# Patient Record
Sex: Female | Born: 1955 | Race: Black or African American | Hispanic: No | Marital: Single | Smoking: Former smoker
Health system: Southern US, Community
[De-identification: ages and names within clinical notes are randomized; demographics above are authoritative.]

## PROBLEM LIST (undated history)

## (undated) DIAGNOSIS — I10 Essential (primary) hypertension: Secondary | ICD-10-CM

## (undated) HISTORY — PX: ABDOMINAL HYSTERECTOMY: SHX81

---

## 2000-12-31 ENCOUNTER — Emergency Department (HOSPITAL_COMMUNITY): Admission: EM | Admit: 2000-12-31 | Discharge: 2000-12-31 | Payer: Self-pay | Admitting: *Deleted

## 2000-12-31 ENCOUNTER — Encounter: Payer: Self-pay | Admitting: *Deleted

## 2001-08-29 ENCOUNTER — Encounter: Payer: Self-pay | Admitting: Emergency Medicine

## 2001-08-29 ENCOUNTER — Observation Stay (HOSPITAL_COMMUNITY): Admission: EM | Admit: 2001-08-29 | Discharge: 2001-08-30 | Payer: Self-pay | Admitting: Emergency Medicine

## 2002-05-15 ENCOUNTER — Encounter: Payer: Self-pay | Admitting: *Deleted

## 2002-05-15 ENCOUNTER — Emergency Department (HOSPITAL_COMMUNITY): Admission: EM | Admit: 2002-05-15 | Discharge: 2002-05-15 | Payer: Self-pay | Admitting: Emergency Medicine

## 2003-04-08 ENCOUNTER — Emergency Department (HOSPITAL_COMMUNITY): Admission: EM | Admit: 2003-04-08 | Discharge: 2003-04-08 | Payer: Self-pay | Admitting: Emergency Medicine

## 2003-04-10 ENCOUNTER — Emergency Department (HOSPITAL_COMMUNITY): Admission: EM | Admit: 2003-04-10 | Discharge: 2003-04-10 | Payer: Self-pay | Admitting: *Deleted

## 2003-04-13 ENCOUNTER — Ambulatory Visit (HOSPITAL_COMMUNITY): Admission: RE | Admit: 2003-04-13 | Discharge: 2003-04-13 | Payer: Self-pay | Admitting: Family Medicine

## 2003-05-08 ENCOUNTER — Inpatient Hospital Stay (HOSPITAL_COMMUNITY): Admission: RE | Admit: 2003-05-08 | Discharge: 2003-05-10 | Payer: Self-pay | Admitting: Obstetrics & Gynecology

## 2004-05-19 ENCOUNTER — Ambulatory Visit (HOSPITAL_COMMUNITY): Admission: RE | Admit: 2004-05-19 | Discharge: 2004-05-19 | Payer: Self-pay | Admitting: Obstetrics & Gynecology

## 2004-10-25 ENCOUNTER — Inpatient Hospital Stay (HOSPITAL_COMMUNITY): Admission: AD | Admit: 2004-10-25 | Discharge: 2004-10-28 | Payer: Self-pay | Admitting: Cardiology

## 2004-10-25 ENCOUNTER — Encounter: Payer: Self-pay | Admitting: Emergency Medicine

## 2004-10-27 ENCOUNTER — Ambulatory Visit: Payer: Self-pay | Admitting: Cardiology

## 2004-11-05 ENCOUNTER — Ambulatory Visit: Payer: Self-pay | Admitting: Physician Assistant

## 2008-10-19 ENCOUNTER — Ambulatory Visit (HOSPITAL_COMMUNITY): Admission: RE | Admit: 2008-10-19 | Discharge: 2008-10-19 | Payer: Self-pay | Admitting: Family Medicine

## 2008-12-11 ENCOUNTER — Ambulatory Visit (HOSPITAL_COMMUNITY): Admission: RE | Admit: 2008-12-11 | Discharge: 2008-12-11 | Payer: Self-pay | Admitting: General Surgery

## 2008-12-11 ENCOUNTER — Encounter (INDEPENDENT_AMBULATORY_CARE_PROVIDER_SITE_OTHER): Payer: Self-pay | Admitting: General Surgery

## 2009-08-14 ENCOUNTER — Emergency Department (HOSPITAL_COMMUNITY): Admission: EM | Admit: 2009-08-14 | Discharge: 2009-08-14 | Payer: Self-pay | Admitting: Emergency Medicine

## 2010-01-09 ENCOUNTER — Ambulatory Visit (HOSPITAL_COMMUNITY): Admission: RE | Admit: 2010-01-09 | Discharge: 2010-01-09 | Payer: Self-pay | Admitting: Family Medicine

## 2010-09-16 NOTE — H&P (Signed)
NAME:  Betty Zamora, Betty Zamora           ACCOUNT NO.:  [id number]   MEDICAL RECORD NO.:  [id number]          PATIENT TYPE:  AMB   LOCATION:  DAY                           FACILITY:  [facility]   PHYSICIAN:  Dalia Heading, M.D.  DATE OF BIRTH:  1955-10-24   DATE OF ADMISSION:  DATE OF DISCHARGE:  LH                              HISTORY & PHYSICAL   CHIEF COMPLAINT:  Hematochezia.   HISTORY OF PRESENT ILLNESS:  The patient is a 55 year old black female  who is referred for endoscopic evaluation.  She needs colonoscopy due to  hematochezia.  No abdominal pain, weight loss, nausea, vomiting,  diarrhea, constipation, or melena have been noted.  She has never had a  colonoscopy.  There is no family history of colon carcinoma.   PAST MEDICAL HISTORY:  Includes hypertension.   PAST SURGICAL HISTORY:  Hysterectomy.   CURRENT MEDICATIONS:  Nabumetone and amlodipine.   ALLERGIES:  No known drug allergies.   REVIEW OF SYSTEMS:  Noncontributory.   PHYSICAL EXAMINATION:  GENERAL:  The patient is a well-developed, well-  nourished black female in no acute distress.  LUNGS:  Clear to auscultation with equal breath sounds bilaterally.  HEART:  Examination reveals regular rate and rhythm without S3, S4, or  murmurs.  ABDOMEN:  Soft, nontender, and nondistended.  No hepatosplenomegaly or  masses are noted.  RECTAL:  Deferred to the procedure.   IMPRESSION:  Hematochezia.   PLAN:  The patient is scheduled for a colonoscopy on December 11, 2008.  Risks and benefits of the procedure including bleeding and perforation  were fully explained to the patient, gave informed consent.      Dalia Heading, M.D.  Electronically Signed    MAJ/MEDQ  D:  12/04/2008  T:  12/05/2008  Job:  [id number]

## 2010-09-19 NOTE — Discharge Summary (Signed)
NAME:  Betty Zamora, Betty Zamora                     ACCOUNT NO.:  [id number]   MEDICAL RECORD NO.:  [id number]                   PATIENT TYPE:  INP   LOCATION:  A417                                 FACILITY:  [facility]   PHYSICIAN:  Lazaro Arms, M.D.                DATE OF BIRTH:  Feb 01, 1956   DATE OF ADMISSION:  05/08/2003  DATE OF DISCHARGE:  05/10/2003                                 DISCHARGE SUMMARY   DISCHARGE DIAGNOSES:  1. Status post total abdominal hysterectomy with bilateral salpingo-     oophorectomy.  2. Postoperative incisional hematoma.  3. Otherwise unremarkable postoperative course.   PROCEDURE:  Total abdominal hysterectomy, bilateral salpingo-oophorectomy.   Please refer to the transcribed history and physical and the operative note  for details of admission to the hospital.   HOSPITAL COURSE:  The patient was admitted postoperatively.  She had an  unremarkable intraoperative course.  She had an unremarkable postoperative  course.  She tolerated clear liquids and a regular diet.  Had flatus and  return of normal bowel function.  Her abdominal exam was benign.  Her  postoperative day #1 hemoglobin and hematocrit were 12.4 and 35.5 with a  white count of 8000.  She had a hematoma on the right side of her incision  that was opened up in the evening on postoperative day #1 and packed and  dressed.  However, that did not delay her discharge.  It was clean.  No  abnormalities at all.  She was discharged to home on the morning of  postoperative day #2 in good and stable condition.  To follow up in the  office on May 11, 2003, to check her incision.  She was discharged to  home on Tylox, Motrin, and Levaquin.  She was given instruction precautions  for calling back to the office.     ___________________________________________                                         Lazaro Arms, M.D.   LHE/MEDQ  D:  05/14/2003  T:  05/14/2003  Job:  [id number]

## 2010-09-19 NOTE — Discharge Summary (Signed)
NAMESHANITRA, PHILLIPPI NO.:  [id number]   MEDICAL RECORD NO.:  [id number]          PATIENT TYPE:  INP   LOCATION:  2009                         FACILITY:  [facility]   PHYSICIAN:  Rollene Rotunda, M.D.   DATE OF BIRTH:  [birth year]   DATE OF ADMISSION:  10/25/2004  DATE OF DISCHARGE:  10/28/2004                                 DISCHARGE SUMMARY   PROCEDURE:  Cardiac catheterization October 27, 2004.   REASON FOR ADMISSION:  Ms. Vanorman is a 55 year old female with no known  history of coronary artery disease, who initially presented to [facility] with complaint of tachy-palpitations associated with dyspnea.  She  was found to have elevated troponins and a negative D-dimer and negative  chest CT scan for pulmonary embolus.  Arrangements were made for subsequent  transfer for further evaluation with cardiac catheterization.  Please refer  to dictated admission note for full details.   LABORATORY DATA:  CBC normal.  Potassium 3.8, BUN 16, creatinine 0.9.  Cardiac enzymes:  Normal CK-MB; peak troponin I 0.13 on admission, 0.05 pre-  discharge.  Lipid profile:  Total cholesterol 165, triglycerides 85, HDL 57,  LDL 91 (cholesterol HDL ratio 2.9).  TSH 1.06.   HOSPITAL COURSE:  Following transfer from [facility], the patient  was maintained on medication regimen of aspirin, beta-blocker, and  intravenous heparin.  Serial cardiac markers were drawn revealing downward  trending of elevated troponin with peak 0.13 on admission; __________ CPK-  MBs, however, remained normal.   Continuous telemetry revealed no dysrhythmia, with only isolated PVCs and no  couplets or triplets.   Cardiac catheterization on 6/26 by Dr. Randa Evens (see report for full  details), revealed angiographically normal coronary arteries and a normal  left ventricle.  Renal arteries were both normal as well.   Dr. Samule Ohm suspected noncardiac etiology of the chest pain.  He also  recommends that further evaluation of possible SVT.   The patient was kept overnight for observation for discharge the following  morning in hemodynamically stable condition.  There were no complications at  the groin incision sites.   MEDICATIONS ON DISCHARGE:  1.  Norvasc 10 mg daily.  2.  Toprol-XL 25 mg daily.  3.  Aspirin 81 mg daily.   INSTRUCTIONS:  1.  As outlined per cardiac catheterization discharge sheet.  2.  Maintain low fat, low cholesterol diet.  3.  Follow up with Jae Dire, [facility] on July 5 at 2 p.m.   DISCHARGE DIAGNOSES:  1.  Noncardiac chest pain.      1.  Normal coronary angiogram October 27, 2004.      2.  Normal left ventricular function.  2.  Tachy-palpitations.      1.  Documented isolated premature ventricular contractions.  3.  History of hypertension.  4.  History of tobacco.      Gene   GS/MEDQ  D:  10/28/2004  T:  10/28/2004  Job:  [id number]   cc:   Kirk Ruths, M.D.  [address]  [place]  [place] [address]  Fax: [phone]

## 2010-09-19 NOTE — Op Note (Signed)
NAME:  Betty Zamora, Betty Zamora                     ACCOUNT NO.:  [id number]   MEDICAL RECORD NO.:  [id number]                   PATIENT TYPE:  AMB   LOCATION:  DAY                                  FACILITY:  [facility]   PHYSICIAN:  Lazaro Arms, M.D.                DATE OF BIRTH:  10-05-1955   DATE OF PROCEDURE:  05/08/2003  DATE OF DISCHARGE:                                 OPERATIVE REPORT   PREOPERATIVE DIAGNOSES:  1. Enlarged fibroid uterus, 14 week's size.  2. Hypermenorrhea.  3. Dysmenorrhea.  4. Dyspareunia.   POSTOPERATIVE DIAGNOSES:  1. Enlarged fibroid uterus, 14 week's size.  2. Hypermenorrhea.  3. Dysmenorrhea.  4. Dyspareunia.   PROCEDURE:  Abdominal hysterectomy with bilateral salpingo-oophorectomy.   SURGEON:  Lazaro Arms, M.D.   ANESTHESIA:  General endotracheal.   FINDINGS:  The patient had a multiloculated, multifibroid uterus.  Her  ovaries were really closely approximated to the fibroids that were up in the  cornu; and there really was no way to remove the uterus without damaging the  blood supply.  They were also quite small and had bilateral tubal cysts;  and, as a result, we had talked previously and I decided to do a BSO as  well.   DESCRIPTION OF PROCEDURE:  The patient was taken to the operating room and  placed in the supine position where she underwent general endotracheal  anesthesia.  She had a Foley catheter placed in her bladder.  Her vagina was  prepped and she underwent abdominal shave.  She was prepped and draped in  the usual sterile fashion including the vagina.  A Pfannenstiel skin  incision was made and carried down sharply through the rectus fascia which  was scored in the midline and extended laterally.  The fascia was taken off  the muscles superiorly and inferiorly without difficulty.  The muscles were  divided and the peritoneal cavity was entered.  No self-retaining retractor  was used.  One pack was used to pack away the upper  abdomen.   The uterus was quite large, filling the entire pelvis, and was  multilobulated.  It was delivered through the incision.  The right round  ligament was suture ligated and cut.  The vesicouterine serosal flap on the  right was created.  An avascular window in the area of the infundibulopelvic  ligament was isolated.  The infundibulopelvic ligament was clamped, cut and  double suture ligated.  The left round ligament was then suture ligated and  cut and the vesicouterine serosal flap was created.  The uterine vessels  were clamped, cut and double suture ligated with good hemostasis. The  uterine vessels were skeletonized.  The uterine vessels were clamped, cut,  and suture ligated.  Serial pedicles were taken down the cervix through the  cardinal ligament with each pedicle being clamped, cut, and transfixion  sutured ligated.  The vagina was cross clamped; specimen was  removed.  The  vaginal angle sutures were placed; and the vagina was closed with  interrupted figure-of-eight sutures.   The pelvis was irrigated vigorously.  All pedicles were found to be  hemostatic. The muscles in the peritoneum were reapproximated loosely using  #0 chromic.  The fascia was closed using #0 Vicryl running.  The  subcutaneous tissue was made hemostatic and irrigated; 3-0 Monocryl sutures  were placed to reapproximate the subcu and Dermabond was used to close the  skin edges.  After the surgery I took the speculum and frog-legged the  patient and viewed the vagina and there was no bleeding in the vagina.  The  patient was awakened from anesthesia taken to the recovery room in good  stable condition.  All counts were correct x3.  She received Ancef  prophylactically.      ___________________________________________                                            Lazaro Arms, M.D.   Loraine Maple  D:  05/08/2003  T:  05/08/2003  Job:  [id number]

## 2010-09-19 NOTE — Discharge Summary (Signed)
[facility]  Patient:    Betty Zamora, Betty Zamora Visit Number: [id number] MRN: [id number]          Service Type: MED Location: 2A A220 01 Attending Physician:  Kirk Ruths Dictated by:   Karleen Hampshire, M.D. Admit Date:  08/29/2001 Discharge Date: 08/30/2001                             Discharge Summary  DISCHARGE DIAGNOSES: 1. Chest pain, musculoskeletal in origin. 2. History of thyroid disease. 3. History of hypertension.  HOSPITAL COURSE:  This 55 year old white female was seen in the emergency room.  Seemed to have a nonspecific left-sided into her neck.  There was some numbness in her left upper extremity intermittently.  Patient was nontender but pain was associated with movement.  Initial EKG and enzymes were normal. She was admitted to rule out MI.  Patient was seen in consultation by Dr. Daphene Jaeger for cardiology who basically agreed and thought it was probably musculoskeletal in origin.  Patients serial enzymes were negative.  Repeat EKG showed no significant change.  Fasting lipids were drawn and not available at the time of this dictation.  Patient is still having some soreness on movement in her left chest and shoulder area.  She will be discharged home on previous medications including Norvasc 10, Tapazole 5, and will put her on Voltaren 75 b.i.d.  To be followed in the office as needed.  Return to work on May 1. Dictated by:   Karleen Hampshire, M.D. Attending Physician:  Kirk Ruths DD:  08/30/01 TD:  08/30/01 Job: [id number]

## 2010-09-19 NOTE — Cardiovascular Report (Signed)
Betty Zamora, Betty Zamora NO.:  [id number]   MEDICAL RECORD NO.:  [id number]          PATIENT TYPE:  INP   LOCATION:                               FACILITY:  [facility]   PHYSICIAN:  Salvadore Farber, M.D. LHCDATE OF BIRTH:  1956-01-25   DATE OF PROCEDURE:  10/27/2004  DATE OF DISCHARGE:                              CARDIAC CATHETERIZATION   PROCEDURE:  Left heart catheterization, left ventriculography, coronary  angiography.   INDICATIONS:  [name] is a 55 year old lady with no history of  atherosclerotic disease who presented with tachy palpitations associated  with shortness of breath and fatigue.  She was in normal sinus rhythm upon  arrival to the [facility] on June 24.  She did, however, evolve  mild elevations in troponin with a peak of 0.13.  She was therefore referred  for diagnostic angiography to exclude substantial coronary disease  accompanying her probable arrhythmia.   PROCEDURAL TECHNIQUE:  Informed consent was obtained.  Under 1% lidocaine  local anesthesia a 5-French sheath was placed in the right common femoral  artery using the modified Seldinger technique.  Diagnostic angiography and  ventriculography were performed using JL4, JR4, and pigtail catheters.  The  pigtail catheter was then pulled back to the suprarenal abdominal aorta.  Abdominal aortography was performed by power injection.  The patient  tolerated the procedure well and was transferred to the holding room in  stable condition.  Sheaths will be removed there.   COMPLICATIONS:  None.   FINDINGS:  1.  LV 143/0/2.  EF 60% without regional wall motion abnormality.  2.  No aortic stenosis or mitral regurgitation.  3.  Abdominal aortography:  Normal abdominal aorta with single renal      arteries bilaterally.  Both renal arteries are normal.  4.  Left main:  Tortuous vessel without stenosis.  5.  LAD:  Moderate sized vessel giving rise to two moderate sized diagonals.   It is angiographically normal.  6.  Ramus intermedius:  Relatively large vessel which is angiographically      normal.  7.  Circumflex:  Small vessel giving rise to a small single obtuse marginal.      It is angiographically normal.  8.  RCA:  Moderate sized dominant vessel.  It is angiographically normal.   </IMPRESSION/RECOMMENDATIONS>  Patient has angiographically normal coronary arteries with normal left  ventricular size and systolic function.  I suspect that her mild troponin  elevation was due to an arrhythmia.  She did not manifest arrhythmia on  monitor while in the hospital.  Will discharge her to home with advice to  present promptly should the arrhythmia recur.       WED/MEDQ  D:  10/27/2004  T:  10/27/2004  Job:  [id number]   cc:   Thomas C. Wall, M.D.

## 2010-09-19 NOTE — H&P (Signed)
[facility]  Patient:    Betty Zamora, Betty Zamora Visit Number: [id number] MRN: [id number]          Service Type: MED Location: 2A A220 01 Attending Physician:  Hilario Quarry Dictated by:   Karleen Hampshire, M.D. Admit Date:  08/29/2001                           History and Physical  CHIEF COMPLAINT:  Chest pain.  PRESENTING ILLNESS:  This is a 55 year old black female who began having left-sided chest pain, in her neck and her left arm, with some numbness approximately three days before admission.  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  It does seem to be associated with movement and cough.  No pain when at rest.  The patient is admitted for rule out MI.  PAST MEDICAL HISTORY:  She has a history of hypertension for which she takes Norvasc 10 mg, some Tapazole 5 mg for thyroid disease.  She is also on a small amount of hydrochlorothiazide daily for blood pressure.  ALLERGIES:  The patient is allergic to no medications.  FAMILY HISTORY:  Family history is significant for hypertension.  No known heart disease.  REVIEW OF SYSTEMS:  She denies nausea, vomiting, diaphoresis.  PHYSICAL EXAMINATION:  GENERAL:  A middle-aged black female in no severe distress.  VITAL SIGNS:  Blood pressure is 150/90.  Respirations are 20 and unlabored. She is afebrile.  Pulse is 86 and regular.  HEENT:  TMs normal.  Pupils are equal to light and accommodation.  Oropharynx benign.  NECK:  Supple without JVD, bruit or thyromegaly.  LUNGS:  Clear in all areas.  HEART:  Regular sinus rhythm without murmur, gallop or rub.  No significant chest wall tenderness.  ABDOMEN:  Soft and nontender.  EXTREMITIES:  Without clubbing, cyanosis, or edema.  NEUROLOGIC:  Examination grossly intact.  ASSESSMENT:  Chest pain, probably musculoskeletal.  Will rule out myocardial infarction, get a cardiology consult. Dictated by:   Karleen Hampshire,  M.D. Attending Physician:  Hilario Quarry DD:  08/29/01 TD:  08/30/01 Job: [id number]

## 2010-09-19 NOTE — H&P (Signed)
Betty Zamora NO.:  [id number]   MEDICAL RECORD NO.:  [id number]          PATIENT TYPE:  INP   LOCATION:  2009                         FACILITY:  [facility]   PHYSICIAN:  Rollene Rotunda, M.D.   DATE OF BIRTH:  1955-11-01   DATE OF ADMISSION:  10/25/2004  DATE OF DISCHARGE:                                HISTORY & PHYSICAL   PRIMARY CARE PHYSICIAN:  Kirk Ruths, M.D.   PRIMARY CARDIOLOGIST:  New and will be Rollene Rotunda, M.D., although she  requests followup in [place].   CHIEF COMPLAINT:  Palpitations.   HISTORY OF PRESENT ILLNESS:  Betty Zamora is a 55 year old African-  American female with no known history of coronary artery disease.  At  approximately 9:30 last night, she was getting ready to go to work and had  sudden onset of tachy-palpitations described as a fluttering.  They  continued and she went on to work.  They were associated with some shortness  of breath and a little fatigue.  While she was at work, she had not much  exertion, but the symptoms continued.  She told her supervisor about it and  they took her to the emergency room.  Her symptoms resolved with rest.  She  was evaluated at [facility] and had an elevated troponin.  A CT scan was  negative for PE.  She was transferred to [facility] for further  evaluation and treatment.   In 2003, Betty Zamora had chest pain for which she was admitted to [facility].  She states that these symptoms were totally different.  At that time,  her enzymes were negative for MI and she had a stress test which she states  was also negative.  She has never had the tachy-palpitations before.  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  She is pain-free at the time  of examination.   PAST MEDICAL HISTORY:  1.  History of hypertension.  2.  She has no history of diabetes or hyperlipidemia, although  she has not      had a recent evaluation.  There is no family history of premature      coronary artery disease, and there is a remote history of tobacco use.  3.  She had a stress test in 2003, that was not ischemic per the patient.   PAST SURGICAL HISTORY:  Status post hysterectomy.   ALLERGIES:  No known drug allergies.   MEDICATIONS:  Norvasc 10 mg daily.   SOCIAL HISTORY:  She lives in [place] with her mother and works in the  [employer] as a Location manager.  She has an approximate 20 pack year history of  tobacco use, and quit in 2005.  She does not abuse alcohol or drugs.  She  states that she drinks little or no caffeinated products, drinks mainly Kool-  Aid.  She does not exercise regularly, although she is active at work.  She  does not take [place] or [employer] Powder's.   FAMILY HISTORY:  Her mother is  alive at age 65, without heart disease.  Her  father was killed in an accident when he was in his 49s.  She has no  siblings with heart disease.   REVIEW OF SYSTEMS:  Significant for tachy-palpitations as described above.  She has occasional arthralgias in her shoulder after working.  She denies  any recent illnesses, fevers, or chills.  There is no hematemesis,  hemoptysis, or melena.  Review of systems is otherwise negative.   PHYSICAL EXAMINATION:  VITAL SIGNS:  Blood pressure initially on arrival was  168/102.  On recheck it was 142/80.  Temperature is 98.4, heart rate 72,  respiratory rate 20, O2 saturation 99% on room air.  GENERAL:  She is a well-developed African-American female in no acute  distress.  HEENT:  Her head is normocephalic, atraumatic.  Pupils equal, round,  reactive to light and accommodation.  Extraocular movements were intact.  Sclerae clear.  Nares without discharge.  NECK:  There is no lymphadenopathy, thyromegaly, bruit, or JVD noted.  CARDIOVASCULAR:  Her heart is regular in rate and rhythm with a S1 and S2,  and no significant murmur, rub, or gallop  is noted.  Her distal pulses are  1+ DP's and 2+ PT's, and there are no femoral bruits appreciated.  LUNGS:  Clear to auscultation bilaterally.  SKIN:  No rashes or lesions are noted.  ABDOMEN:  Soft and nontender with active bowel sounds and no  hepatosplenomegaly by palpation.  EXTREMITIES:  There is no cyanosis, clubbing, or edema.  MUSCULOSKELETAL:  There is no joint deformity or effusions, and no spine or  CVA tenderness is noted.  NEUROLOGIC:  She is alert and oriented with cranial nerves II through XII  grossly intact.   LABORATORY DATA:  Chest x-ray showed no acute disease.   EKG is sinus tachycardia with a heart rate of 104.  She has some slight  abnormality in her lateral T-waves, and there is no old for comparison.   Hemoglobin 13.4, hematocrit 37.8, WBC's 6.8, platelets 286.  Sodium 143,  potassium initially 3 (4.1 after supplement), chloride 112, CO2 of 23, BUN  15, creatinine 1, glucose 144.  CK-MB 154/3.1.  Troponin-I 0.36.  D-dimer  0.22.  BNP 183.   ASSESSMENT AND PLAN:  1.  Tachy-palpitations:  She will be monitored overnight and a beta blocker      will be added to her medication regimen.  2.  Possible coronary artery disease:  We will cycle enzymes and add aspirin      as well as heparin to her medication regimen.  The patient will be      scheduled for cardiac catheterization on Monday.  3.  Unknown lipid status:  We will check a lipid profile in the a.m.  If she      rules in for a myocardial infarction, we      will add an empiric statin.  4.  Hyperglycemia:  Check a hemoglobin A1C and follow up on the results.  5.  Hypertension:  Continue the Norvasc and add a beta blocker to her      medication regimen.      Betty Zamora   RB/MEDQ  D:  10/25/2004  T:  10/26/2004  Job:  [id number]   cc:   Kirk Ruths, M.D.  [address]  [place]  [place] [address]  Fax: [phone]

## 2010-10-24 ENCOUNTER — Inpatient Hospital Stay (HOSPITAL_COMMUNITY)
Admission: EM | Admit: 2010-10-24 | Discharge: 2010-10-25 | DRG: 188 | Disposition: A | Discharge status: Home / Self Care | Payer: BC Managed Care – PPO | Attending: Internal Medicine | Admitting: Internal Medicine

## 2010-10-24 DIAGNOSIS — K644 Residual hemorrhoidal skin tags: Secondary | ICD-10-CM | POA: Diagnosis present

## 2010-10-24 DIAGNOSIS — K625 Hemorrhage of anus and rectum: Secondary | ICD-10-CM

## 2010-10-24 DIAGNOSIS — K5730 Diverticulosis of large intestine without perforation or abscess without bleeding: Secondary | ICD-10-CM | POA: Diagnosis present

## 2010-10-24 DIAGNOSIS — K5521 Angiodysplasia of colon with hemorrhage: Secondary | ICD-10-CM

## 2010-10-24 DIAGNOSIS — K6289 Other specified diseases of anus and rectum: Principal | ICD-10-CM | POA: Diagnosis present

## 2010-10-24 DIAGNOSIS — I10 Essential (primary) hypertension: Secondary | ICD-10-CM | POA: Diagnosis present

## 2010-10-24 LAB — CBC
HCT: 39.6 % (ref 36.0–46.0)
Hemoglobin: 13.7 g/dL (ref 12.0–15.0)
MCH: 31.9 pg (ref 26.0–34.0)
MCH: 31.8 pg (ref 26.0–34.0)
MCV: 92.1 fL (ref 78.0–100.0)
MCV: 91.3 fL (ref 78.0–100.0)
Platelets: 223 10*3/uL (ref 150–400)
RBC: 4.3 MIL/uL (ref 3.87–5.11)
RDW: 12.7 % (ref 11.5–15.5)
WBC: 6.7 10*3/uL (ref 4.0–10.5)

## 2010-10-24 LAB — COMPREHENSIVE METABOLIC PANEL
ALT: 15 U/L (ref 0–35)
BUN: 13 mg/dL (ref 6–23)
CO2: 27 mEq/L (ref 19–32)
Calcium: 10.6 mg/dL — ABNORMAL HIGH (ref 8.4–10.5)
Creatinine, Ser: 0.79 mg/dL (ref 0.50–1.10)
GFR calc Af Amer: >60 mL/min (ref >60)
GFR calc non Af Amer: >60 mL/min (ref >60)
Glucose, Bld: 118 mg/dL — ABNORMAL HIGH (ref 70–99)

## 2010-10-24 LAB — DIFFERENTIAL
Eosinophils Absolute: 0.1 10*3/uL (ref 0.0–0.7)
Eosinophils Relative: 2 % (ref 0–5)
Lymphs Abs: 2.3 10*3/uL (ref 0.7–4.0)
Lymphs Abs: 3.4 10*3/uL (ref 0.7–4.0)
Monocytes Relative: 6 % (ref 3–12)
Neutro Abs: 2.7 10*3/uL (ref 1.7–7.7)
Neutrophils Relative %: 49 % (ref 43–77)

## 2010-10-24 LAB — APTT: aPTT: 28 seconds (ref 24–37)

## 2010-10-25 LAB — DIFFERENTIAL
Basophils Relative: 0 % (ref 0–1)
Eosinophils Absolute: 0.1 10*3/uL (ref 0.0–0.7)
Eosinophils Relative: 2 % (ref 0–5)
Monocytes Absolute: 0.3 10*3/uL (ref 0.1–1.0)
Monocytes Relative: 6 % (ref 3–12)
Neutrophils Relative %: 42 % — ABNORMAL LOW (ref 43–77)

## 2010-10-25 LAB — BASIC METABOLIC PANEL
Calcium: 9.3 mg/dL (ref 8.4–10.5)
GFR calc non Af Amer: >60 mL/min (ref >60)
Sodium: 137 mEq/L (ref 135–145)

## 2010-10-25 LAB — CBC
MCH: 31.5 pg (ref 26.0–34.0)
MCHC: 34.5 g/dL (ref 30.0–36.0)
Platelets: 236 10*3/uL (ref 150–400)
RDW: 12.6 % (ref 11.5–15.5)

## 2010-10-26 NOTE — Discharge Summary (Signed)
  NAMEJOHNICE, RIEBE           ACCOUNT NO.:  [id number]  MEDICAL RECORD NO.:  [id number]  LOCATION:  A341                          FACILITY:  [facility]  PHYSICIAN:  Wilson Singer, M.D.DATE OF BIRTH:  Feb 02, 1956  DATE OF ADMISSION:  10/24/2010 DATE OF DISCHARGE:  06/23/2012LH                              DISCHARGE SUMMARY   FINAL DISCHARGE DIAGNOSES:  Lower GI bleed secondary to external hemorrhoids/small rectal AV malformations, status post ablation with argon plasma coagulator.  Uncontrolled hypertension.  No need for blood transfusion.  CONDITION ON DISCHARGE:  Stable.  MEDICATIONS ON DISCHARGE:  Norvasc 10 mg daily.  HISTORY OF PRESENT ILLNESS:  This very pleasant 55 year old lady was admitted with lower GI bleed.  Please see initial history and physical examination done by Dr. Elliot Cousin.  HOSPITAL PROGRESS:  The patient was monitored in terms of her blood pressure and hemoglobin and these remained stable.  In fact, her blood pressure was actually elevated and adjustments have been made to her medications regarding this.  She was seen by Dr. Karilyn Cota, gastroenterologist who performed a colonoscopy where he found external hemorrhoids and two small rectal AV malformations.  He ablated with argon plasma coagulator, few small rectal AV malformations.  She has not had any further bleeding and her hemoglobin and vital signs have remained stable.  PHYSICAL EXAMINATION:  Today, temperature 98.4, blood pressure 148/83, pulse 60, saturation 99% on room air.  She looks systemically well. HEART:  Sounds present, normal.  LUNG:  Fields are clear.  ABDOMEN: Soft and nontender.  INVESTIGATIONS:  Today, show hemoglobin stable at 12.8, white blood cell count 5.1, platelets 236.  Sodium 137, potassium 4.1, bicarbonate 26, BUN 12, creatinine 0.68, magnesium normal at 1.9.  TSH reduced at 0.207, but free T4 in the normal range of 1.16.  DISPOSITION:  The patient is stable to be  discharged and I will send her home and I have asked her to increase her Norvasc to 10 mg a day and further titration can be done by her primary care physician whom I have asked her to follow up in the next 2 weeks.  Also, her TSH is abnormally low and this will probably need to be followed up also.  She does not need to have a colonoscopy for another 5 years per Dr. Karilyn Cota.     Wilson Singer, M.D.     NCG/MEDQ  D:  10/25/2010  T:  10/25/2010  Job:  [id number]  cc:   Kirk Ruths, M.D. Fax: [phone]  Lionel December, M.D. Fax: [phone]  Electronically Signed by Lilly Cove M.D. on 10/26/2010 10:44:20 AM

## 2010-10-28 NOTE — H&P (Signed)
Betty Zamora, Betty Zamora NO.:  [id number]  MEDICAL RECORD NO.:  [id number]  LOCATION:  A341                          FACILITY:  [facility]  PHYSICIAN:  Elliot Cousin, M.D.    DATE OF BIRTH:  [birth year]  DATE OF ADMISSION:  10/24/2010 DATE OF DISCHARGE:  LH                             HISTORY & PHYSICAL   PRIMARY CARE PHYSICIAN:  Kirk Ruths, MD  CHIEF COMPLAINT:  Bloody stools.  HISTORY OF PRESENT ILLNESS:  The patient is a 55 year old woman with a past medical history significant for hypertension and a tubular adenoma of the sigmoid colon per colonoscopy in August 2010, who presents to the hospital today with a chief complaint of bloody stools.  She works third shift.  Late last night and early this morning, she began to feel her stomach growling.  She proceeded to have a bowel movement, however, most of what she saw was blood.  She went back to work. Several minutes later, she had to go back to the bathroom to have a another bloody bowel movement.  This happened once more for a total of 3 episodes in all.  She also witnessed blood clots in her stool.  She denies stomach or abdominal pain.  All she describes is stomach growling.  She has had no associated nausea, vomiting, or pain with urination.  She denies chest pain or shortness of breath.  She has had no recent fever or chills.  She admits drinking 2 alcoholic beverages, primarily to help her sleep.  She does not drink alcohol every day but several times weekly.  She denies regular NSAID use.  Currently, the patient is hypertensive with a blood pressure of 191/107. Her pulse rate of 60.  She is oxygenating 95% on room air.  Her lab data are significant for a normal PTT and normal PT.  Her hemoglobin is 13.7. She is being admitted for further evaluation and management.  PAST MEDICAL HISTORY: 1. Hemacochezia in 2010.  The colonoscopy as performed by Dr. Franky Macho revealed a tubular adenoma but no  evidence of high-grade     dysplasia. 2. Hypertension. 3. Chest pain in 2006 with normal coronary arteries per cardiac     catheterization by Dr. Samule Ohm. 4. Possible SVT in 2006. 5. Status post hysterectomy and bilateral salpingo-oophorectomy in     January 2005.  MEDICATIONS:  Norvasc 5 mg daily.  ALLERGIES:  No known drug allergies.  SOCIAL HISTORY:  The patient is single.  [place]  She has no children.  She denies illicit drug use.  She does drink 2 alcoholic beverages several days weekly.  She smokes 2-3 cigarettes per day.  FAMILY HISTORY:  Her mother is 63 years of age and has a history of metastatic ovarian cancer, colon cancer, and hypertension.  Her father died from complications of a job Psychologist, educational.  REVIEW OF SYSTEMS:  As above in the history present illness.  Otherwise review of systems is negative.  PHYSICAL EXAMINATION:  VITAL SIGNS:  Temperature 98.2, pulse 60, respiratory rate 16, blood pressure 191/107, and oxygen saturation 95% on room air. GENERAL:  The patient is a pleasant 55 year old African  American woman who is currently sitting up in bed in no acute distress. HEENT:  Head is normocephalic and nontraumatic.  Pupils equal, round, and reactive to light.  Extraocular muscles are intact.  Conjunctivae are clear.  Sclerae are white.  Tympanic membranes not examined.  Nasal mucosa is dry.  No sinus tenderness.  Oropharynx reveals mildly dry mucous membranes.  No posterior exudates or erythema. NECK:  Supple.  No adenopathy.  Mild enlargement of the thyroid.  No nodules palpated.  No JVD and no bruit. LUNGS:  Clear to auscultation bilaterally. HEART:  S1-S2 with a soft systolic murmur and borderline bradycardia. ABDOMEN:  Mildly obese, positive bowel sounds, soft, nontender, and nondistended.  No hepatosplenomegaly and no masses palpated. GU/RECTAL:  Deferred. EXTREMITIES:  Pedal pulses are palpable bilaterally.  No pretibial  edema and no pedal edema. NEUROLOGIC:  The patient is alert and oriented x3.  Cranial nerves II- XII are intact.  Strength is 5/5 throughout.  Sensation is intact. PSYCHOLOGIC:  The patient is mildly tremulous, but she says this is from anxiety about the impending colonoscopy.  She denies any history of alcohol withdrawal syndrome.  She says that she has no problem with alcohol abuse.  Her affect is pleasant.  She is cooperative.  She is well-groomed.  ADMISSION LABORATORY DATA:  PTT 28.  PT 12.8, and INR 0.94.  Sodium 142, potassium 3.5, chloride 105, CO2 27, glucose 118, BUN 13, and creatinine 0.79.  Total bilirubin 0.5, alkaline phosphatase 86, SGOT 18, SGPT 15, total protein 7.8, albumin 4.2, and calcium 10.6.  WBC 5.4, hemoglobin 13.7, and platelet count 262.  ASSESSMENT: 1. Gastrointestinal bleed.  The patient has a history of a tubular     adenoma.  Currently, the bleeding has stopped.  Gastroenterologist,     Dr. Karilyn Cota, will be consulted for further evaluation via a     colonoscopy for a definitive diagnoses.  Her hemoglobin is     currently stable but I do expect it to decrease with hydration and     equilibration. 2. Malignant hypertension.  The patient is treated with Norvasc 5 mg     daily.  She admits not being compliant with followup with Dr.     Regino Schultze regarding her hypertension.  It is obvious that Norvasc is     not enough to control her blood pressure. 3. Mild hyperglycemia, nonfasting. 4. Borderline hypokalemia. 5. Borderline hypercalcemia. 6. Alcohol use versus abuse. 7. Tobacco abuse.  PLAN: 1. We will consult gastroenterologist, Dr. Karilyn Cota. 2. We will start gentle IV fluids for hydration. 3. We will monitor her hemoglobin and hematocrit appropriately. 4. We will start prophylactic intravenous Protonix daily. 5. We will start a clear liquid diet and modify it according to the     gastroenterologist's recommendations. 6. We will increase the dose of  Norvasc to 10 mg daily.  We will add     clonidine at 0.1 mg t.i.d. and add p.r.n. IV hydralazine for     treatment of malignant hypertension. 7. We will add as-needed Ativan although I doubt that the patient will     experience alcohol withdrawal syndrome.  We will also start vitamin     therapy empirically. 8. We will monitor her electrolytes including her calcium, magnesium,     and potassium levels. 9. We will assess her thyroid function with a TSH and free T4. 10.We will order tobacco cessation counseling.  The patient was     personally instructed to decrease her  alcohol intake to 0-     1 alcoholic beverage daily and to stop smoking all together.     Elliot Cousin, M.D.     DF/MEDQ  D:  10/24/2010  T:  10/25/2010  Job:  [id number]  cc:   Kirk Ruths, M.D. Fax: [phone]  Electronically Signed by Elliot Cousin M.D. on 10/28/2010 11:53:46 AM

## 2010-11-02 ENCOUNTER — Emergency Department (HOSPITAL_COMMUNITY)
Admission: EM | Admit: 2010-11-02 | Discharge: 2010-11-02 | Disposition: A | Discharge status: Home / Self Care | Payer: BC Managed Care – PPO | Attending: Emergency Medicine | Admitting: Emergency Medicine

## 2010-11-02 DIAGNOSIS — Z79899 Other long term (current) drug therapy: Secondary | ICD-10-CM | POA: Insufficient documentation

## 2010-11-02 DIAGNOSIS — Z008 Encounter for other general examination: Secondary | ICD-10-CM | POA: Insufficient documentation

## 2010-11-02 DIAGNOSIS — I10 Essential (primary) hypertension: Secondary | ICD-10-CM | POA: Insufficient documentation

## 2010-11-02 LAB — CBC
HCT: 38.8 % (ref 36.0–46.0)
Hemoglobin: 13.5 g/dL (ref 12.0–15.0)
MCHC: 34.8 g/dL (ref 30.0–36.0)
MCV: 91.3 fL (ref 78.0–100.0)

## 2010-11-02 LAB — COMPREHENSIVE METABOLIC PANEL
ALT: 17 U/L (ref 0–35)
AST: 18 U/L (ref 0–37)
Alkaline Phosphatase: 73 U/L (ref 39–117)
CO2: 25 mEq/L (ref 19–32)
Calcium: 11.2 mg/dL — ABNORMAL HIGH (ref 8.4–10.5)
Potassium: 3.7 mEq/L (ref 3.5–5.1)
Sodium: 143 mEq/L (ref 135–145)
Total Protein: 7.9 g/dL (ref 6.0–8.3)

## 2010-11-02 LAB — DIFFERENTIAL
Basophils Absolute: 0 10*3/uL (ref 0.0–0.1)
Lymphocytes Relative: 38 % (ref 12–46)
Lymphs Abs: 2.7 10*3/uL (ref 0.7–4.0)
Monocytes Absolute: 0.5 10*3/uL (ref 0.1–1.0)
Monocytes Relative: 8 % (ref 3–12)
Neutro Abs: 3.8 10*3/uL (ref 1.7–7.7)

## 2010-11-02 LAB — PROTIME-INR: INR: 0.97 (ref 0.00–1.49)

## 2010-11-02 LAB — SAMPLE TO BLOOD BANK

## 2010-11-11 NOTE — Consult Note (Signed)
Betty Zamora, PYLANT NO.:  [id number]  MEDICAL RECORD NO.:  [id number]  LOCATION:  A341                          FACILITY:  [facility]  PHYSICIAN:  Dorene Ar, NP       DATE OF BIRTH:  12/12/1955  DATE OF CONSULTATION:  10/24/2010 DATE OF DISCHARGE:                                CONSULTATION   REASON FOR CONSULTATION:  Rectal bleeding.  HISTORY OF PRESENT ILLNESS:  Betty Zamora is a 55 year old black female admitted through the emergency room last night with complaints of rectal bleeding.  She tells me that she was at work and at around 4:00 a.m., she felt the for urge to go to the bathroom.  She had two episodes of bright red rectal bleeding which she describes as "a lot."  She has had no further rectal bleeding since the episode at work.  She states her appetite has been good.  There has been no weight loss.  She denies any black stools.  There is no rectal pain.  She has not been on any NSAIDs. She is not having any abdominal pain at this time.  She denies prior history of anemia.  She did undergo a colonoscopy in August 2010 by Dr. Lovell Sheehan.  She underwent a colonoscopy for rectal bleeding.  The biopsy revealed a sigmoid polyp which was a tubular adenoma.  There was no high- grade dysplasia.  There are no known allergies.  SURGERIES:  She had a hysterectomy in 2006 for a large fibroid.  She has a history of hypertension for 10 years.  SOCIAL HISTORY:  She smokes a pack about every week for 15 years.  She does not drink or do drugs.  She is not married.  She does not have any children.  Social, her father was killed on the job when she was 67 years old.  Her mother is alive in good health in her 67s.  She has 3 brothers in good health and 4 sisters in good health.  HOME MEDICATIONS:  Norvasc 5 mg a day.  OBJECTIVE:  VITAL SIGNS:  Her blood pressure is 184/75, pulse 80, and respirations 20. GENERAL:  She is alert and oriented. HEENT:  Her sclerae are anicteric.   Her conjunctivae are pink.  She has natural teeth.  Her oral mucosa is moist.  There are no lesions. NECK:  Thyroid, she does have fullness to her neck.  There is no cervical lymphadenopathy.  LUNGS:  Clear. HEART:  Regular rate and rhythm. ABDOMEN:  Soft.  Bowel sounds are present.  No masses. RECTAL:  I did and there were no tears or hemorrhoids noted.  I did not guaiac stool.  LABORATORY DATA:  Hemoglobin 13.7, hematocrit is 39.6, her MCV is 92.1, platelets of 262 neutrophils 49.  Sodium is 142, potassium is 3.5, chloride 105, carbon dioxide is 27, glucose is 118, BUN 13, creatinine 0.79, calcium 10.6, and total protein 7.8.  ASSESSMENT:  Betty Zamora is a 55 year old female admitted with a probable lower gastrointestinal bleed.  A colonic neoplasm, polyp, arteriovenous malformation or ulcer need to be ruled out.  RECOMMENDATIONS:  We will schedule a colonoscopy for later this afternoon.. The patient is agreeable to proceed  with the colonoscopy.  Please note that her weight and height were not in the computer this morning.  I will add this later to my dictation.          ______________________________ Dorene Ar, NP     TS/MEDQ  D:  10/24/2010  T:  10/24/2010  Job:  [id number]  Electronically Signed by Dorene Ar PA on 10/27/2010 09:00:08 AM Electronically Signed by Lionel December M.D. on 11/11/2010 09:29:37 PM

## 2010-11-11 NOTE — Op Note (Signed)
  NAMERANDYE, TREICHLER NO.:  [id number]  MEDICAL RECORD NO.:  [id number]  LOCATION:  A341                          FACILITY:  [facility]  PHYSICIAN:  Betty Zamora, M.D.    DATE OF BIRTH:  09-22-1955  DATE OF PROCEDURE:  10/24/2010 DATE OF DISCHARGE:                              OPERATIVE REPORT   PROCEDURE:  Colonoscopy with APC ablation two small rectal AV malformations.  INDICATIONS:  Betty Zamora is a 55 year old Afro-American female who presents with acute lower GI bleed.  She is hemodynamically stable and her H and H was normal.  Her last colonoscopy was in August 2010 when she has had tubular adenoma removed by Dr. Lovell Sheehan.  She is undergoing diagnostic exam.  Procedure risks were reviewed with the patient.  Informed consent was obtained.  MEDICATIONS FOR CONSCIOUS SEDATION:  Demerol 50 mg IV and Versed 6 mg IV.  FINDINGS:  Procedure performed in endoscopy suite.  The patient's vital signs and O2 sat were monitored during the procedure and remained stable.  The patient was placed in left lateral recumbent position and rectal examination was performed.  No abnormality noted on external or digital exam.  Pentax videoscope was placed through rectum and advanced under vision into sigmoid colon and beyond.  Preparation was excellent. She had no fresh or old blood in the segment for colon.  Scope was advanced to cecum which was identified by appendiceal orifice and ileocecal valve.  Short segment of GI was also examined and it was normal.  As the scope was withdrawn, colonic mucosa was carefully examined.  Three small diverticula noted at sigmoid colon on the way in. All three of these were carefully examined and no stigmata of bleeding were noted.  There was a large scar at distal sigmoid colon site of previous polypectomy.  Pictures were taken for the record.  In the rectum, there were two rather small AV malformations.  These were, however, not bleeding.  Both of  these were ablated with argon plasma coagulator.  Retroflex view of the rectum revealed small hemorrhoids below the dentate line once again without clot or stigmata of bleeding. Endoscope was then withdrawn.  Withdrawal time was 24 minutes.  The patient tolerated the procedure well.  FINAL DIAGNOSES: 1. Normal terminal ileum. 2. Three small diverticula at sigmoid colon. 3. A scar at sigmoid colon site of previous polypectomy. 4. Two small rectal AV malformations ablated with argon plasma     coagulator. 5. External hemorrhoids. 6. Suspect she may have bleed either from rectal AV malformations or     hemorrhoids.  I doubt this is a diverticular bleed.  RECOMMENDATIONS: 1. We will advance diet 4 g sodium diet. 2. No aspirin for 1 week. 3. She could wait 5 years before her next exam.          ______________________________ Betty Zamora, M.D.     NR/MEDQ  D:  10/24/2010  T:  10/25/2010  Job:  [id number]  cc:   Kirk Ruths, M.D. Fax: [phone]  Dr. Lovell Sheehan  Electronically Signed by Betty Zamora M.D. on 11/11/2010 09:29:48 PM

## 2011-08-31 ENCOUNTER — Emergency Department (HOSPITAL_COMMUNITY): Payer: BC Managed Care – PPO

## 2011-08-31 ENCOUNTER — Emergency Department (HOSPITAL_COMMUNITY)
Admission: EM | Admit: 2011-08-31 | Discharge: 2011-08-31 | Disposition: A | Discharge status: Home / Self Care | Payer: BC Managed Care – PPO | Attending: Emergency Medicine | Admitting: Emergency Medicine

## 2011-08-31 ENCOUNTER — Encounter (HOSPITAL_COMMUNITY): Payer: Self-pay | Admitting: Emergency Medicine

## 2011-08-31 DIAGNOSIS — H538 Other visual disturbances: Secondary | ICD-10-CM | POA: Insufficient documentation

## 2011-08-31 DIAGNOSIS — R51 Headache: Secondary | ICD-10-CM | POA: Insufficient documentation

## 2011-08-31 DIAGNOSIS — Z79899 Other long term (current) drug therapy: Secondary | ICD-10-CM | POA: Insufficient documentation

## 2011-08-31 DIAGNOSIS — R42 Dizziness and giddiness: Secondary | ICD-10-CM | POA: Insufficient documentation

## 2011-08-31 DIAGNOSIS — H532 Diplopia: Secondary | ICD-10-CM

## 2011-08-31 DIAGNOSIS — I10 Essential (primary) hypertension: Secondary | ICD-10-CM | POA: Insufficient documentation

## 2011-08-31 HISTORY — DX: Essential (primary) hypertension: I10

## 2011-08-31 LAB — GLUCOSE, CAPILLARY: Glucose-Capillary: 101 mg/dL — ABNORMAL HIGH (ref 70–99)

## 2011-08-31 MED ORDER — TETRACAINE HCL 0.5 % OP SOLN
OPHTHALMIC | Status: AC
  Administered 2011-08-31: 06:00:00
  Filled 2011-08-31: qty 2

## 2011-08-31 MED ORDER — AMLODIPINE BESYLATE 10 MG PO TABS: 5.0000 mg | ORAL_TABLET | Freq: Every day | ORAL | Status: DC

## 2011-08-31 MED ORDER — FLUORESCEIN SODIUM 1 MG OP STRP
ORAL_STRIP | OPHTHALMIC | Status: AC
  Administered 2011-08-31: 06:00:00
  Filled 2011-08-31: qty 1

## 2011-08-31 NOTE — Discharge Instructions (Signed)
Diplopia   Double vision (diplopia) means that you are seeing two of everything. Diplopia usually occurs with both eyes open, and may be worse when looking in one particular direction. If both eyes must be open to see double, this is called binocular diplopia. If double images are seen in just one eye, this is called monocular diplopia.   CAUSES   Binocular Diplopia   Disorder affecting the muscles that move the eyes or the nerves that control those muscles.   Tumor or other mass pushing on an eye from beside or behind the eye.   Myasthenia gravis. This is a neuromuscular illness that causes the body's muscles to tire easily. The eye muscles and eyelid muscles become weak. The eyes do not track together well.   Grave's disease. This is an overactivity of the thyroid gland. This condition causes swelling of tissues around the eyes. This produces a bulging out of the eyeball.   Blowout fracture of the bone around the eye. The muscles of the eye socket are damaged. This often happens when the eye is hit with force.   Complications after certain surgeries, such as a lens implant after cataract surgery.   Fluid-filled mass (abscess) behind or beside the eye, infection, or abnormal connection between arteries and veins.  Sometimes, no cause is found.  Monocular Diplopia   Problems with corrective lens or contacts.   Corneal abrasion, infection, severe injury, or bulging and irregularity of the corneal surface (keratoconus).   Irregularities of the pupil from drugs, severe injury, or other causes.   Problems involving the lens of the eye, such as opacities or cataracts.   Complications after certain surgeries, such as a lens implant after cataract surgery.   Retinal detachment or problems involving the blood vessels of the retina.  Sometimes, no cause is found.  SYMPTOMS   Binocular Diplopia   When one eye is closed or covered, the double images disappear.  Monocular Diplopia   When the unaffected eye is  closed or covered, the double images remain. The double images disappear when the affected eye is closed or covered.  DIAGNOSIS   A diagnosis is made during an eye exam.  TREATMENT   Treatment depends on the cause or underlying disease.    Relief of double vision symptoms may be achieved by patching one eye or by using special glasses.   Surgery on the muscles of the eye may be needed.  SEEK IMMEDIATE MEDICAL CARE IF:    You see two images of a single object you are looking at, either with both eyes open or with just one eye open.  Document Released: 02/20/2004 Document Revised: 04/09/2011 Document Reviewed: 12/07/2007  [employer] Patient Information 2012 [employer].

## 2011-08-31 NOTE — ED Provider Notes (Signed)
History     CSN: [id number]  Arrival date & time 08/31/11  4098   First MD Initiated Contact with Patient 08/31/11 0550      Chief Complaint  Patient presents with  . Dizziness  . Blurred Vision     Patient is a 56 y.o. female presenting with eye problem.  Eye Problem  This is a new problem. The current episode started 1 to 2 hours ago. The problem occurs constantly. The problem has not changed since onset.There was no injury mechanism. The pain is at a severity of 0/10. The patient is experiencing no pain. There is no history of trauma to the eye. She does not wear contacts. Associated symptoms include blurred vision and double vision. Pertinent negatives include no numbness, no decreased vision, no discharge, no foreign body sensation, no photophobia, no eye redness, no nausea, no vomiting, no tingling and no weakness. She has tried an eye patch for the symptoms. The treatment provided mild relief.  This affects both eyes Pt reports she was at work (she works night shift at [employer]) She felt slight dizziness, and soon after noticed double vision.  She denies surroundings spinning, but felt lightheaded. No visual loss She reports only mild headache that is resolved No eye pain No trauma to eye, no foreign body sensation to eye No cp/sob No focal arm or leg weakness. No syncope She has never had this before She tried patching her eye at work with some improvement in symptoms  Past Medical History  Diagnosis Date  . Hypertension     Past Surgical History  Procedure Date  . Abdominal hysterectomy     History reviewed. No pertinent family history.  History  Substance Use Topics  . Smoking status: Current Everyday Smoker  . Smokeless tobacco: Not on file  . Alcohol Use: Yes     occasionally    OB History    Grav Para Term Preterm Abortions TAB SAB Ect Mult Living                  Review of Systems  Eyes: Positive for blurred vision and double vision.  Negative for photophobia, discharge and redness.  Gastrointestinal: Negative for nausea and vomiting.  Neurological: Negative for tingling, weakness and numbness.  All other systems reviewed and are negative.    Allergies  Review of patient's allergies indicates no known allergies.  Home Medications   Current Outpatient Rx  Name Route Sig Dispense Refill  . AMLODIPINE BESYLATE 10 MG PO TABS Oral Take 10 mg by mouth daily.      BP 191/103  Pulse 80  Temp(Src) 99 F (37.2 C) (Oral)  Resp 20  Ht 5\' 5"  (1.651 m)  Wt 155 lb (70.308 kg)  BMI 25.79 kg/m2  SpO2 100%  Physical Exam CONSTITUTIONAL: Well developed/well nourished HEAD AND FACE: Normocephalic/atraumatic EYES: EOMI/PERRL, no conjunctival injection, no foreign body noted, no corneal abrasion, no consensual pain, IOP 18-20 in each eye, visual acuity noted.  She reports double vision is improved when she covers either eye.  Mild horizontal nystagmus is noted. Normal fundoscopic exam ENMT: Mucous membranes moist NECK: supple no meningeal signs, no bruits CV: S1/S2 noted, no murmurs/rubs/gallops noted LUNGS: Lungs are clear to auscultation bilaterally, no apparent distress ABDOMEN: soft, nontender, no rebound or guarding GU:no cva tenderness NEURO: Awake/alert, facies symmetric, no arm or leg drift is noted Cranial nerves 3/4/5/6/11/09/08/11/12 tested and intact Gait normal No ataxia is noted No pastpointing (she has to close one  eye to perform task) Skin: no rash/petechiae noted.  Color normal.  Warm EXTREMITIES: pulses normal, full ROM SKIN: warm, color normal PSYCH: no abnormalities of mood noted  ED Course  Procedures    7:03 AM Pt with binocular diplopia with mild dizziness Will get CT imaging and reassess 8:04 AM Ct head negative I spoke to dr Lita Mains on call for optho, if glucose is normal and no cranial nerve deficits, have her restart BP meds and will see in office within 2-48 hours  The patient appears  reasonably screened and/or stabilized for discharge and I doubt any other medical condition or other [facility] requiring further screening, evaluation, or treatment in the ED at this time prior to discharge.    MDM  Nursing notes reviewed and considered in documentation         Joya Gaskins, MD 08/31/11 [phone]

## 2011-08-31 NOTE — ED Notes (Signed)
Patient was at work and became dizzy. Immediately after patient states her left eye is blurry. Left eye is red and swollen.

## 2012-01-14 ENCOUNTER — Encounter (HOSPITAL_COMMUNITY): Payer: Self-pay | Admitting: Emergency Medicine

## 2012-01-14 ENCOUNTER — Emergency Department (HOSPITAL_COMMUNITY)
Admission: EM | Admit: 2012-01-14 | Discharge: 2012-01-14 | Disposition: A | Discharge status: Home / Self Care | Payer: Worker's Compensation | Attending: Emergency Medicine | Admitting: Emergency Medicine

## 2012-01-14 DIAGNOSIS — I10 Essential (primary) hypertension: Secondary | ICD-10-CM | POA: Insufficient documentation

## 2012-01-14 DIAGNOSIS — S0101XA Laceration without foreign body of scalp, initial encounter: Secondary | ICD-10-CM

## 2012-01-14 DIAGNOSIS — Z23 Encounter for immunization: Secondary | ICD-10-CM | POA: Insufficient documentation

## 2012-01-14 DIAGNOSIS — IMO0002 Reserved for concepts with insufficient information to code with codable children: Secondary | ICD-10-CM | POA: Insufficient documentation

## 2012-01-14 DIAGNOSIS — F17200 Nicotine dependence, unspecified, uncomplicated: Secondary | ICD-10-CM | POA: Insufficient documentation

## 2012-01-14 DIAGNOSIS — S0100XA Unspecified open wound of scalp, initial encounter: Secondary | ICD-10-CM | POA: Insufficient documentation

## 2012-01-14 MED ORDER — TETANUS-DIPHTH-ACELL PERTUSSIS 5-2.5-18.5 LF-MCG/0.5 IM SUSP
0.5000 mL | Freq: Once | INTRAMUSCULAR | Status: AC
  Administered 2012-01-14: 0.5 mL via INTRAMUSCULAR
  Filled 2012-01-14: qty 0.5

## 2012-01-14 NOTE — ED Notes (Addendum)
Pt presents with laceration to the back of her head, states she works [employer] and got hit in the back of the head with a piece of plastic. Denies any LOC. Laceration appears to be 1cm in length and not currently bleeding at this time. Pt being cleaned for better inspection of area. Pt states she believes she pulled the plastic piece out of her head.

## 2012-01-14 NOTE — ED Provider Notes (Signed)
History    56 year old female with a scalp laceration. Onset just before arrival. Mechanism: Patient was struck in the head with a piece of plastic while at work. No loss of consciousness. No headache. No neck or back pain. No numbness, tingling or strain. No acute visual changes. Patient is unsure last admission. Patient has no other complaints. CSN: [id number]  Arrival date & time 01/14/12  0411   First MD Initiated Contact with Patient 01/14/12 [phone]      Chief Complaint  Patient presents with  . Head Laceration    (Consider location/radiation/quality/duration/timing/severity/associated sxs/prior treatment) HPI  Past Medical History  Diagnosis Date  . Hypertension     Past Surgical History  Procedure Date  . Abdominal hysterectomy     History reviewed. No pertinent family history.  History  Substance Use Topics  . Smoking status: Current Every Day Smoker -- 0.5 packs/day    Types: Cigarettes  . Smokeless tobacco: Not on file  . Alcohol Use: Yes     occasionally    OB History    Grav Para Term Preterm Abortions TAB SAB Ect Mult Living                  Review of Systems   Review of symptoms negative unless otherwise noted in HPI.   LACERATION REPAIR Performed by: Raeford Razor Authorized by: Raeford Razor Consent: Verbal consent obtained. Risks and benefits: risks, benefits and alternatives were discussed Consent given by: patient Patient identity confirmed: provided demographic data Prepped and Draped in normal sterile fashion Wound explored  Laceration Location: Scalp  Laceration Length: 1.5 cm  No Foreign Bodies seen or palpated  Anesthesia: local infiltration  Local anesthetic: None   Anesthetic total: Not applicable   Irrigation method: syringe Amount of cleaning: standard  Skin closure: Single layer  Number of sutures: 2   Technique: Stapled  Patient tolerance: Patient tolerated the procedure well with no immediate  complications.   Allergies  Review of patient's allergies indicates no known allergies.  Home Medications   Current Outpatient Rx  Name Route Sig Dispense Refill  . ASPIRIN 81 MG PO CHEW Oral Chew 81 mg by mouth daily.    Marland Kitchen AMLODIPINE BESYLATE 10 MG PO TABS Oral Take 10 mg by mouth daily.    Marland Kitchen AMLODIPINE BESYLATE 10 MG PO TABS Oral Take 0.5 tablets (5 mg total) by mouth daily. 14 tablet 0    BP 167/83  Pulse 79  Temp 98.2 F (36.8 C) (Oral)  Resp 16  Ht 5\' 5"  (1.651 m)  Wt 155 lb (70.308 kg)  BMI 25.79 kg/m2  SpO2 98%  Physical Exam  Nursing note and vitals reviewed. Constitutional: She is oriented to person, place, and time. She appears well-developed and well-nourished. No distress.  HENT:  Head: Normocephalic.       Approximately 1 cm laceration at the vertex of the skull. No active bleeding. No underlying hematoma or bony tenderness. No foreign body visualized or palpated.  Eyes: Conjunctivae normal are normal. Right eye exhibits no discharge. Left eye exhibits no discharge.  Neck: Neck supple.  Cardiovascular: Normal rate, regular rhythm and normal heart sounds.  Exam reveals no gallop and no friction rub.   No murmur heard. Pulmonary/Chest: Effort normal and breath sounds normal. No respiratory distress.  Abdominal: Soft. She exhibits no distension. There is no tenderness.  Musculoskeletal: She exhibits no edema and no tenderness.       No midline spinal tenderness  Neurological: She is  alert and oriented to person, place, and time. No cranial nerve deficit. She exhibits normal muscle tone. Coordination normal.       Gait is steady  Skin: Skin is warm and dry.  Psychiatric: She has a normal mood and affect. Her behavior is normal. Thought content normal.    ED Course  Procedures (including critical care time)  Labs Reviewed - No data to display No results found.   1. Scalp laceration       MDM  56 year old female with a scalp laceration. This was  repaired. Tetanus was updated. No significant concern for foreign body or when infection. No red flags concerning an injury. Return precautions were discussed. Outpatient followup for staple removal and as needed        Raeford Razor, MD 01/14/12 [phone]

## 2012-01-14 NOTE — ED Notes (Signed)
Lab contacted about collecting drug screen, was notified all phlebotomists were upstairs and it would be 0600 before they could get to her. Pt notified and aware of wait, Pt escorted to lobby and lab was notified she is waiting outside.

## 2016-02-12 DIAGNOSIS — Z23 Encounter for immunization: Secondary | ICD-10-CM | POA: Diagnosis not present

## 2016-03-02 ENCOUNTER — Emergency Department (HOSPITAL_COMMUNITY)
Admission: EM | Admit: 2016-03-02 | Discharge: 2016-03-02 | Disposition: A | Discharge status: Home / Self Care | Payer: BLUE CROSS/BLUE SHIELD | Attending: Emergency Medicine | Admitting: Emergency Medicine

## 2016-03-02 ENCOUNTER — Encounter (HOSPITAL_COMMUNITY): Payer: Self-pay

## 2016-03-02 DIAGNOSIS — Z79899 Other long term (current) drug therapy: Secondary | ICD-10-CM | POA: Insufficient documentation

## 2016-03-02 DIAGNOSIS — F17210 Nicotine dependence, cigarettes, uncomplicated: Secondary | ICD-10-CM | POA: Diagnosis not present

## 2016-03-02 DIAGNOSIS — Z7982 Long term (current) use of aspirin: Secondary | ICD-10-CM | POA: Insufficient documentation

## 2016-03-02 DIAGNOSIS — I10 Essential (primary) hypertension: Secondary | ICD-10-CM | POA: Insufficient documentation

## 2016-03-02 DIAGNOSIS — J40 Bronchitis, not specified as acute or chronic: Secondary | ICD-10-CM | POA: Diagnosis not present

## 2016-03-02 DIAGNOSIS — M62830 Muscle spasm of back: Secondary | ICD-10-CM | POA: Diagnosis not present

## 2016-03-02 DIAGNOSIS — M545 Low back pain: Secondary | ICD-10-CM | POA: Diagnosis present

## 2016-03-02 MED ORDER — AZITHROMYCIN 250 MG PO TABS: ORAL_TABLET | ORAL | 0 refills | Status: DC

## 2016-03-02 MED ORDER — METHOCARBAMOL 500 MG PO TABS
500.0000 mg | ORAL_TABLET | Freq: Once | ORAL | Status: AC
  Administered 2016-03-02: 500 mg via ORAL
  Filled 2016-03-02: qty 1

## 2016-03-02 MED ORDER — KETOROLAC TROMETHAMINE 60 MG/2ML IM SOLN
60.0000 mg | Freq: Once | INTRAMUSCULAR | Status: AC
  Administered 2016-03-02: 60 mg via INTRAMUSCULAR
  Filled 2016-03-02: qty 2

## 2016-03-02 MED ORDER — METHOCARBAMOL 750 MG PO TABS: ORAL_TABLET | ORAL | 0 refills | Status: DC

## 2016-03-02 MED ORDER — NAPROXEN 500 MG PO TABS: ORAL_TABLET | ORAL | 0 refills | Status: DC

## 2016-03-02 NOTE — ED Triage Notes (Signed)
Patient states that she is having pain in her lower left back.  States that it feels like a muscle spasm.

## 2016-03-02 NOTE — ED Provider Notes (Signed)
[facility] DEPT Provider Note   CSN: [id number] Arrival date & time: 03/02/16  0537  Time seen 05:48 AM   History   Chief Complaint Chief Complaint  Patient presents with  . Back Pain    HPI Betty Zamora is a 60 y.o. female.  HPI patient reports October 27 she removed a air conditioning unit from a window by herself. She was unaware of any injury. However the following day she started having some pain in her left flank area. If she sits still or lays on her left side she has no pain. However if she moves or she breathes deeply she gets pain. She denies any urinary frequency, dysuria, or hematuria. She states she's never had this pain before. 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  Patient also describes a dry cough for the past week without fever. She denies SOB, rhinorrhea, sore throat, nausea, vomiting, or diarrhea.  [facility]P [facility]  Past Medical History:  Diagnosis Date  . Hypertension     There are no active problems to display for this patient.   Past Surgical History:  Procedure Laterality Date  . ABDOMINAL HYSTERECTOMY      OB History    No data available       Home Medications    Prior to Admission medications   Medication Sig Start Date End Date Taking? Authorizing Provider  amLODipine (NORVASC) 10 MG tablet Take 10 mg by mouth daily.    Historical Provider, MD  amLODipine (NORVASC) 10 MG tablet Take 0.5 tablets (5 mg total) by mouth daily. 08/31/11 08/30/12  Zadie Rhineonald Wickline, MD  aspirin 81 MG chewable tablet Chew 81 mg by mouth daily.    Historical Provider, MD  azithromycin (ZITHROMAX) 250 MG tablet Take 2 po the first day then once a day for the next 4 days. 03/02/16   Devoria AlbeIva Chantry Headen, MD  methocarbamol (ROBAXIN) 750 MG tablet Take 1 or 2 po Q 6hrs for pain, it may make you sleepy 03/02/16   Devoria AlbeIva Azjah Pardo, MD  naproxen (NAPROSYN) 500 MG tablet Take 1 po BID with food prn pain 03/02/16   Devoria AlbeIva Eliya Bubar, MD      Family History No family history on file.  Social History Social History  Substance Use Topics  . Smoking status: Current Every Day Smoker    Packs/day: 0.50    Types: Cigarettes  . Smokeless tobacco: Never Used  . Alcohol use Yes     Comment: occasionally  Employed Smokes 1 pack per week   Allergies   Review of patient's allergies indicates no known allergies.   Review of Systems Review of Systems  All other systems reviewed and are negative.    Physical Exam Updated Vital Signs BP 151/87 (BP Location: Left Arm)   Pulse 102   Temp 98.3 F (36.8 C) (Oral)   Resp 16   Ht 5\' 4"  (1.626 m)   Wt 148 lb (67.1 kg)   SpO2 98%   BMI 25.40 kg/m   Vital signs normal except for hypertension   Physical Exam  Constitutional: She is oriented to person, place, and time. She appears well-developed and well-nourished.  Non-toxic appearance. She does not appear ill. No distress.  HENT:  Head: Normocephalic and atraumatic.  Right Ear: External ear normal.  Left Ear: External ear normal.  Nose: Nose normal. No mucosal edema or rhinorrhea.  Mouth/Throat: Oropharynx is clear and moist and mucous membranes are normal. No dental  abscesses or uvula swelling.  Eyes: Conjunctivae and EOM are normal. Pupils are equal, round, and reactive to light.  Neck: Normal range of motion and full passive range of motion without pain. Neck supple.  Cardiovascular: Normal rate, regular rhythm and normal heart sounds.  Exam reveals no gallop and no friction rub.   No murmur heard. Pulmonary/Chest: Effort normal and breath sounds normal. No respiratory distress. She has no wheezes. She has no rhonchi. She has no rales. She exhibits no tenderness and no crepitus.  Abdominal: Soft. Normal appearance and bowel sounds are normal. She exhibits no distension. There is no tenderness. There is no rebound and no guarding.  Musculoskeletal: Normal range of motion. She exhibits no edema or tenderness.        Back:  Moves all extremities well. Patient has no pain to palpation of her thoracic or lumbar spine. She has tenderness just underneath the rib cage of the left posterior chest. When she does range of motion of the waist to the left or the right it causes pain. She has no pain with forward flexion.  Neurological: She is alert and oriented to person, place, and time. She has normal strength. No cranial nerve deficit.  Skin: Skin is warm, dry and intact. No rash noted. No erythema. No pallor.  Psychiatric: She has a normal mood and affect. Her speech is normal and behavior is normal. Her mood appears not anxious.  Nursing note and vitals reviewed.    ED Treatments / Results   Procedures Procedures (including critical care time)  Medications Ordered in ED Medications  ketorolac (TORADOL) injection 60 mg (60 mg Intramuscular Given 03/02/16 0605)  methocarbamol (ROBAXIN) tablet 500 mg (500 mg Oral Given 03/02/16 0605)     Initial Impression / Assessment and Plan / ED Course  I have reviewed the triage vital signs and the nursing notes.  Pertinent labs & imaging results that were available during my care of the patient were reviewed by me and considered in my medical decision making (see chart for details).  Clinical Course   Patient was given Toradol IM for pain and oral Robaxin. She was advised to use ice and heat on her back for her musculoskeletal pain. 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 Since she's been coughing for a week she was started on antibiotic. She is also a smoker.  Final Clinical Impressions(s) / ED Diagnoses   Final diagnoses:  Muscle spasm of back  Bronchitis    New Prescriptions New Prescriptions   AZITHROMYCIN (ZITHROMAX) 250 MG TABLET    Take 2 po the first day then once a day for the next 4 days.   METHOCARBAMOL (ROBAXIN) 750 MG TABLET    Take 1 or 2 po Q 6hrs  for pain, it may make you sleepy   NAPROXEN (NAPROSYN) 500 MG TABLET    Take 1 po BID with food prn pain    Plan discharge  Devoria AlbeIva Clarice Zulauf, MD, [facility]    Ninnie Fein, MD 03/02/16 [phone]

## 2016-03-02 NOTE — Discharge Instructions (Signed)
Use ice and heat on your back for comfort. Take the medications as prescribed. You can take mucinex DM for cough. Take the antibiotic until gone. Recheck if you get a fever, vomiting, constant worsening pain or you seem worse.

## 2016-03-02 NOTE — ED Triage Notes (Addendum)
Patient states that she has had a cough for a week.  Denies any urinary problems.

## 2016-05-12 DIAGNOSIS — F17219 Nicotine dependence, cigarettes, with unspecified nicotine-induced disorders: Secondary | ICD-10-CM | POA: Diagnosis not present

## 2016-05-12 DIAGNOSIS — Z716 Tobacco abuse counseling: Secondary | ICD-10-CM | POA: Diagnosis not present

## 2016-05-12 DIAGNOSIS — Z008 Encounter for other general examination: Secondary | ICD-10-CM | POA: Diagnosis not present

## 2016-05-12 DIAGNOSIS — I10 Essential (primary) hypertension: Secondary | ICD-10-CM | POA: Diagnosis not present

## 2016-05-18 DIAGNOSIS — E039 Hypothyroidism, unspecified: Secondary | ICD-10-CM | POA: Diagnosis not present

## 2016-05-18 DIAGNOSIS — E663 Overweight: Secondary | ICD-10-CM | POA: Diagnosis not present

## 2016-05-18 DIAGNOSIS — I10 Essential (primary) hypertension: Secondary | ICD-10-CM | POA: Diagnosis not present

## 2016-05-18 DIAGNOSIS — Z6826 Body mass index (BMI) 26.0-26.9, adult: Secondary | ICD-10-CM | POA: Diagnosis not present

## 2016-05-18 DIAGNOSIS — Z1389 Encounter for screening for other disorder: Secondary | ICD-10-CM | POA: Diagnosis not present

## 2016-05-18 DIAGNOSIS — Z0001 Encounter for general adult medical examination with abnormal findings: Secondary | ICD-10-CM | POA: Diagnosis not present

## 2016-06-04 DIAGNOSIS — F17219 Nicotine dependence, cigarettes, with unspecified nicotine-induced disorders: Secondary | ICD-10-CM | POA: Diagnosis not present

## 2016-06-04 DIAGNOSIS — Z719 Counseling, unspecified: Secondary | ICD-10-CM | POA: Diagnosis not present

## 2016-06-04 DIAGNOSIS — Z716 Tobacco abuse counseling: Secondary | ICD-10-CM | POA: Diagnosis not present

## 2016-06-23 DIAGNOSIS — I10 Essential (primary) hypertension: Secondary | ICD-10-CM | POA: Diagnosis not present

## 2016-06-23 DIAGNOSIS — F17219 Nicotine dependence, cigarettes, with unspecified nicotine-induced disorders: Secondary | ICD-10-CM | POA: Diagnosis not present

## 2016-06-23 DIAGNOSIS — Z716 Tobacco abuse counseling: Secondary | ICD-10-CM | POA: Diagnosis not present

## 2016-07-07 DIAGNOSIS — Z139 Encounter for screening, unspecified: Secondary | ICD-10-CM | POA: Diagnosis not present

## 2016-07-07 DIAGNOSIS — F17219 Nicotine dependence, cigarettes, with unspecified nicotine-induced disorders: Secondary | ICD-10-CM | POA: Diagnosis not present

## 2016-07-07 DIAGNOSIS — Z716 Tobacco abuse counseling: Secondary | ICD-10-CM | POA: Diagnosis not present

## 2016-07-11 LAB — TSH: TSH: 0.27 — AB (ref <=5.90)

## 2016-07-21 DIAGNOSIS — E559 Vitamin D deficiency, unspecified: Secondary | ICD-10-CM | POA: Diagnosis not present

## 2016-07-21 DIAGNOSIS — F17219 Nicotine dependence, cigarettes, with unspecified nicotine-induced disorders: Secondary | ICD-10-CM | POA: Diagnosis not present

## 2016-07-21 DIAGNOSIS — I10 Essential (primary) hypertension: Secondary | ICD-10-CM | POA: Diagnosis not present

## 2016-07-21 DIAGNOSIS — E0590 Thyrotoxicosis, unspecified without thyrotoxic crisis or storm: Secondary | ICD-10-CM | POA: Diagnosis not present

## 2016-07-21 DIAGNOSIS — Z716 Tobacco abuse counseling: Secondary | ICD-10-CM | POA: Diagnosis not present

## 2016-08-10 DIAGNOSIS — I10 Essential (primary) hypertension: Secondary | ICD-10-CM | POA: Diagnosis not present

## 2016-08-10 DIAGNOSIS — M1990 Unspecified osteoarthritis, unspecified site: Secondary | ICD-10-CM | POA: Diagnosis not present

## 2016-08-10 DIAGNOSIS — Z1389 Encounter for screening for other disorder: Secondary | ICD-10-CM | POA: Diagnosis not present

## 2016-08-10 DIAGNOSIS — Z6825 Body mass index (BMI) 25.0-25.9, adult: Secondary | ICD-10-CM | POA: Diagnosis not present

## 2016-08-10 DIAGNOSIS — E663 Overweight: Secondary | ICD-10-CM | POA: Diagnosis not present

## 2016-08-10 DIAGNOSIS — E063 Autoimmune thyroiditis: Secondary | ICD-10-CM | POA: Diagnosis not present

## 2016-08-10 LAB — TSH: TSH: 0.25 — AB (ref <=5.90)

## 2016-09-03 DIAGNOSIS — E0590 Thyrotoxicosis, unspecified without thyrotoxic crisis or storm: Secondary | ICD-10-CM | POA: Diagnosis not present

## 2016-09-03 DIAGNOSIS — Z719 Counseling, unspecified: Secondary | ICD-10-CM | POA: Diagnosis not present

## 2016-09-03 DIAGNOSIS — Z716 Tobacco abuse counseling: Secondary | ICD-10-CM | POA: Diagnosis not present

## 2016-09-03 DIAGNOSIS — F17219 Nicotine dependence, cigarettes, with unspecified nicotine-induced disorders: Secondary | ICD-10-CM | POA: Diagnosis not present

## 2016-09-24 DIAGNOSIS — E0590 Thyrotoxicosis, unspecified without thyrotoxic crisis or storm: Secondary | ICD-10-CM | POA: Diagnosis not present

## 2016-11-12 ENCOUNTER — Other Ambulatory Visit (HOSPITAL_COMMUNITY): Payer: Self-pay | Admitting: Registered Nurse

## 2016-11-12 DIAGNOSIS — Z1231 Encounter for screening mammogram for malignant neoplasm of breast: Secondary | ICD-10-CM

## 2016-11-16 ENCOUNTER — Ambulatory Visit (HOSPITAL_COMMUNITY)
Admission: RE | Admit: 2016-11-16 | Discharge: 2016-11-16 | Disposition: A | Discharge status: Home / Self Care | Payer: BLUE CROSS/BLUE SHIELD | Source: Ambulatory Visit | Attending: Registered Nurse | Admitting: Registered Nurse

## 2016-11-16 DIAGNOSIS — Z1231 Encounter for screening mammogram for malignant neoplasm of breast: Secondary | ICD-10-CM | POA: Insufficient documentation

## 2016-12-31 DIAGNOSIS — F17219 Nicotine dependence, cigarettes, with unspecified nicotine-induced disorders: Secondary | ICD-10-CM | POA: Diagnosis not present

## 2016-12-31 DIAGNOSIS — Z008 Encounter for other general examination: Secondary | ICD-10-CM | POA: Diagnosis not present

## 2016-12-31 DIAGNOSIS — Z716 Tobacco abuse counseling: Secondary | ICD-10-CM | POA: Diagnosis not present

## 2016-12-31 DIAGNOSIS — E0590 Thyrotoxicosis, unspecified without thyrotoxic crisis or storm: Secondary | ICD-10-CM | POA: Diagnosis not present

## 2016-12-31 DIAGNOSIS — I10 Essential (primary) hypertension: Secondary | ICD-10-CM | POA: Diagnosis not present

## 2016-12-31 DIAGNOSIS — Z79899 Other long term (current) drug therapy: Secondary | ICD-10-CM | POA: Diagnosis not present

## 2016-12-31 DIAGNOSIS — Z719 Counseling, unspecified: Secondary | ICD-10-CM | POA: Diagnosis not present

## 2017-01-05 DIAGNOSIS — Z716 Tobacco abuse counseling: Secondary | ICD-10-CM | POA: Diagnosis not present

## 2017-01-05 DIAGNOSIS — F17219 Nicotine dependence, cigarettes, with unspecified nicotine-induced disorders: Secondary | ICD-10-CM | POA: Diagnosis not present

## 2017-01-05 DIAGNOSIS — E559 Vitamin D deficiency, unspecified: Secondary | ICD-10-CM | POA: Diagnosis not present

## 2017-01-05 DIAGNOSIS — E0590 Thyrotoxicosis, unspecified without thyrotoxic crisis or storm: Secondary | ICD-10-CM | POA: Diagnosis not present

## 2017-01-11 DIAGNOSIS — E0590 Thyrotoxicosis, unspecified without thyrotoxic crisis or storm: Secondary | ICD-10-CM | POA: Diagnosis not present

## 2017-01-11 DIAGNOSIS — Z6824 Body mass index (BMI) 24.0-24.9, adult: Secondary | ICD-10-CM | POA: Diagnosis not present

## 2017-01-11 DIAGNOSIS — Z1389 Encounter for screening for other disorder: Secondary | ICD-10-CM | POA: Diagnosis not present

## 2017-01-11 LAB — TSH: TSH: 0.19 — AB (ref <=5.90)

## 2017-02-08 ENCOUNTER — Ambulatory Visit (INDEPENDENT_AMBULATORY_CARE_PROVIDER_SITE_OTHER): Payer: Self-pay | Admitting: "Endocrinology

## 2017-02-08 ENCOUNTER — Encounter: Payer: Self-pay | Admitting: "Endocrinology

## 2017-02-08 VITALS — BP 149/80 | HR 80 | Ht 64.5 in | Wt 141.0 lb

## 2017-02-08 DIAGNOSIS — E0590 Thyrotoxicosis, unspecified without thyrotoxic crisis or storm: Secondary | ICD-10-CM

## 2017-02-08 NOTE — Progress Notes (Signed)
Subjective:    Patient ID: Betty Zamora, female    DOB: 1955/05/09, PCP P[facility], [facility]   Past Medical History:  Diagnosis Date  . Hypertension    Past Surgical History:  Procedure Laterality Date  . ABDOMINAL HYSTERECTOMY     Social History   Social History  . Marital status: Single    Spouse name: N/A  . Number of children: N/A  . Years of education: N/A   Social History Main Topics  . Smoking status: Former Smoker    Packs/day: 0.50    Types: Cigarettes  . Smokeless tobacco: Never Used  . Alcohol use Yes     Comment: occasionally  . Drug use: No  . Sexual activity: Not Asked   Other Topics Concern  . None   Social History Narrative  . None   Outpatient Encounter Prescriptions as of 02/08/2017  Medication Sig  . lisinopril (PRINIVIL,ZESTRIL) 10 MG tablet Take 10 mg by mouth daily.  Marland Kitchen VITAMIN D, ERGOCALCIFEROL, PO Take by mouth daily.  Marland Kitchen amLODipine (NORVASC) 10 MG tablet Take 10 mg by mouth daily.  . [DISCONTINUED] amLODipine (NORVASC) 10 MG tablet Take 0.5 tablets (5 mg total) by mouth daily.  . [DISCONTINUED] aspirin 81 MG chewable tablet Chew 81 mg by mouth daily.  . [DISCONTINUED] azithromycin (ZITHROMAX) 250 MG tablet Take 2 po the first day then once a day for the next 4 days.  . [DISCONTINUED] methocarbamol (ROBAXIN) 750 MG tablet Take 1 or 2 po Q 6hrs for pain, it may make you sleepy  . [DISCONTINUED] naproxen (NAPROSYN) 500 MG tablet Take 1 po BID with food prn pain   No facility-administered encounter medications on file as of 02/08/2017.    ALLERGIES: No Known Allergies  VACCINATION STATUS: Immunization History  Administered Date(s) Administered  . Tdap 01/14/2012    HPI Betty Zamora is 61 y.o. female who presents today with a medical history as above. she is being seen in consultation for Hyperthyroidism requested by Dr. Phillips Odor of Pllc, [facility].  she  denies symptoms of weight loss, however  her records shows that she lost about 14 pounds in the last 2 years. She has not tried to lose weight. - She denies palpitations, heat intolerance/sweating . She is not on antithyroid medications nor any beta blockers.  She denies family history of thyroid dysfunction. She is on treatment for hypertension with lisinopril and amlodipine. She denies any history of goiter, nor any family history of thyroid cancer. She denies any exposure to thyroid hormone nor supplements. She denies any exposure to neck radiation.  Review of Systems  Constitutional: + weight loss, no fatigue, no subjective hyperthermia, no subjective hypothermia Eyes: no blurry vision, no xerophthalmia ENT: no sore throat, no nodules palpated in throat, no dysphagia/odynophagia, no hoarseness Cardiovascular: no Chest Pain, no Shortness of Breath, no palpitations, no leg swelling Respiratory: no cough, no SOB Gastrointestinal: no Nausea/Vomiting/Diarhhea Musculoskeletal: no muscle/joint aches Skin: no rashes Neurological: no tremors, no numbness, no tingling, no dizziness Psychiatric: no depression, no anxiety  Objective:    BP (!) 149/80   Pulse 80   Ht 5' 4.5" (1.638 m)   Wt 141 lb (64 kg)   BMI 23.83 kg/m   Wt Readings from Last 3 Encounters:  02/08/17 141 lb (64 kg)  03/02/16 148 lb (67.1 kg)  01/14/12 155 lb (70.3 kg)    Physical Exam   Constitutional: + appropriate weight for height, not in acute distress, +  anxious state of mind Eyes: PERRLA, EOMI, no exophthalmos ENT: moist mucous membranes, + palpable thyroid with mild enlargement, no cervical lymphadenopathy Cardiovascular: normal precordial activity, Regular Rate and Rhythm, no Murmur/Rubs/Gallops Respiratory:  adequate breathing efforts, no gross chest deformity, Clear to auscultation bilaterally Gastrointestinal: abdomen soft, Non -tender, No distension, Bowel Sounds present Musculoskeletal: no gross deformities, strength intact in all four  extremities Skin: moist, warm, no rashes Neurological: ++ tremor with outstretched hands, ++ Deep tendon reflexes normal in bilateral lower extremities.  CMP ( most recent) CMP     Component Value Date/Time   NA 143 11/02/2010 0916   K 3.7 11/02/2010 0916   CL 107 11/02/2010 0916   CO2 25 11/02/2010 0916   GLUCOSE 111 (H) 11/02/2010 0916   BUN 16 11/02/2010 0916   CREATININE 0.93 11/02/2010 0916   CALCIUM 11.2 (H) 11/02/2010 0916   PROT 7.9 11/02/2010 0916   ALBUMIN 4.4 11/02/2010 0916   AST 18 11/02/2010 0916   ALT 17 11/02/2010 0916   ALKPHOS 73 11/02/2010 0916   BILITOT 0.7 11/02/2010 0916   GFRNONAA >60 11/02/2010 0916   GFRAA >60 11/02/2010 0916   Thyroid function tests: 01/11/2017 TSH suppressed at 0.19, total T4 normal at 8.6, total T3 normal at 117 08/10/2016 TSH suppressed at 0.253 07/11/2016 TSH suppressed at 0.27   Assessment & Plan:   1. Subclinical hyperthyroidism  - Betty Zamora  is being seen at a kind request of Dr. Phillips Odor of P[facility]c, [facility]. - I have reviewed her available thyroid records and clinically evaluated the patient. - Based on my reviews, she has  subclinical hyperthyroidism.  - She does have subtle signs including progressive weight loss, anxious state of mind. -She will need thyroid uptake and scan to study the state of her thyroid activity better. - This test will be scheduled to be done as soon as possible. -she will return in 2 weeks review her  studies and treatment decision if necessary.   - I did not initiate any new prescriptions today.   - I advised patient to maintain close follow up with [facility], [facility] for primary care needs. Follow up plan: Return in about 2 weeks (around 02/22/2017) for follow up with thyroid uptake and scan.  Marquis Lunch, MD [facility] Phone: [phone]  Fax: [phone]   02/08/2017, 8:53 AM This note was  partially dictated with voice recognition software. Similar sounding words can be transcribed inadequately or may not  be corrected upon review.

## 2017-02-15 ENCOUNTER — Encounter (HOSPITAL_COMMUNITY): Payer: BLUE CROSS/BLUE SHIELD

## 2017-02-16 ENCOUNTER — Encounter (HOSPITAL_COMMUNITY): Payer: BLUE CROSS/BLUE SHIELD

## 2017-02-16 DIAGNOSIS — F17219 Nicotine dependence, cigarettes, with unspecified nicotine-induced disorders: Secondary | ICD-10-CM | POA: Diagnosis not present

## 2017-02-16 DIAGNOSIS — E0590 Thyrotoxicosis, unspecified without thyrotoxic crisis or storm: Secondary | ICD-10-CM | POA: Diagnosis not present

## 2017-02-16 DIAGNOSIS — Z716 Tobacco abuse counseling: Secondary | ICD-10-CM | POA: Diagnosis not present

## 2017-02-17 ENCOUNTER — Encounter (HOSPITAL_COMMUNITY): Payer: Self-pay

## 2017-02-17 ENCOUNTER — Encounter (HOSPITAL_COMMUNITY)
Admission: RE | Admit: 2017-02-17 | Discharge: 2017-02-17 | Disposition: A | Discharge status: Home / Self Care | Payer: BLUE CROSS/BLUE SHIELD | Source: Ambulatory Visit | Attending: "Endocrinology | Admitting: "Endocrinology

## 2017-02-17 DIAGNOSIS — E0590 Thyrotoxicosis, unspecified without thyrotoxic crisis or storm: Secondary | ICD-10-CM | POA: Insufficient documentation

## 2017-02-17 MED ORDER — SODIUM IODIDE I-123 7.4 MBQ CAPS
400.0000 | ORAL_CAPSULE | Freq: Once | ORAL | Status: AC
  Administered 2017-02-17: 384 via ORAL

## 2017-02-18 ENCOUNTER — Encounter (HOSPITAL_COMMUNITY)
Admission: RE | Admit: 2017-02-18 | Discharge: 2017-02-18 | Disposition: A | Discharge status: Home / Self Care | Payer: BLUE CROSS/BLUE SHIELD | Source: Ambulatory Visit | Attending: "Endocrinology | Admitting: "Endocrinology

## 2017-02-18 DIAGNOSIS — E0590 Thyrotoxicosis, unspecified without thyrotoxic crisis or storm: Secondary | ICD-10-CM | POA: Diagnosis not present

## 2017-02-23 ENCOUNTER — Ambulatory Visit (INDEPENDENT_AMBULATORY_CARE_PROVIDER_SITE_OTHER): Payer: BLUE CROSS/BLUE SHIELD | Admitting: "Endocrinology

## 2017-02-23 ENCOUNTER — Encounter: Payer: Self-pay | Admitting: "Endocrinology

## 2017-02-23 VITALS — BP 149/88 | HR 87 | Ht 64.5 in | Wt 141.0 lb

## 2017-02-23 DIAGNOSIS — E0590 Thyrotoxicosis, unspecified without thyrotoxic crisis or storm: Secondary | ICD-10-CM | POA: Diagnosis not present

## 2017-02-23 DIAGNOSIS — I10 Essential (primary) hypertension: Secondary | ICD-10-CM

## 2017-02-23 MED ORDER — LISINOPRIL 20 MG PO TABS: 20.0000 mg | ORAL_TABLET | Freq: Every day | ORAL | 6 refills | Status: DC

## 2017-02-23 NOTE — Progress Notes (Signed)
Subjective:    Patient ID: Betty Zamora, female    DOB: 02/28/1956, PCP Assunta FoundGolding, John, MD   Past Medical History:  Diagnosis Date  . Hypertension    Past Surgical History:  Procedure Laterality Date  . ABDOMINAL HYSTERECTOMY     Social History   Social History  . Marital status: Single    Spouse name: N/A  . Number of children: N/A  . Years of education: N/A   Social History Main Topics  . Smoking status: Former Smoker    Packs/day: 0.50    Types: Cigarettes  . Smokeless tobacco: Never Used  . Alcohol use Yes     Comment: occasionally  . Drug use: No  . Sexual activity: Not Asked   Other Topics Concern  . None   Social History Narrative  . None   Outpatient Encounter Prescriptions as of 02/23/2017  Medication Sig  . amLODipine (NORVASC) 10 MG tablet Take 10 mg by mouth daily.  Marland Kitchen. lisinopril (PRINIVIL,ZESTRIL) 20 MG tablet Take 1 tablet (20 mg total) by mouth daily.  Marland Kitchen. VITAMIN D, ERGOCALCIFEROL, PO Take by mouth daily.  . [DISCONTINUED] lisinopril (PRINIVIL,ZESTRIL) 10 MG tablet Take 20 mg by mouth daily.   No facility-administered encounter medications on file as of 02/23/2017.    ALLERGIES: No Known Allergies  VACCINATION STATUS: Immunization History  Administered Date(s) Administered  . Tdap 01/14/2012    HPI Betty AlbaBettie J Meneely is 61 y.o. female who presents today with a medical history as above. she is being seen in Follow-up for subclinical hyperthyroidism with thyroid uptake and scan. she did have about 14 pounds weight loss  in the last 2 years. She has not tried to lose weight. - She denies palpitations, heat intolerance/sweating . She is not on antithyroid medications nor any beta blockers.  She denies family history of thyroid dysfunction. She is on treatment for hypertension with lisinopril and amlodipine. She denies any history of goiter, nor any family history of thyroid cancer. She denies any exposure to thyroid hormone nor  supplements. She denies any exposure to neck radiation. - She has no new complaints since last visit.  Review of Systems  Constitutional: + steady weight since last visit,  no fatigue, no subjective hyperthermia, no subjective hypothermia Eyes: no blurry vision, no xerophthalmia ENT: no sore throat, no nodules palpated in throat, no dysphagia/odynophagia, no hoarseness Cardiovascular: no Chest Pain, no Shortness of Breath, no palpitations, no leg swelling Respiratory: no cough, no SOB Gastrointestinal: no Nausea/Vomiting/Diarhhea Musculoskeletal: no muscle/joint aches Skin: no rashes Neurological: no tremors, no numbness, no tingling, no dizziness Psychiatric: no depression, no anxiety  Objective:    BP (!) 149/88   Pulse 87   Ht 5' 4.5" (1.638 m)   Wt 141 lb (64 kg)   BMI 23.83 kg/m   Wt Readings from Last 3 Encounters:  02/23/17 141 lb (64 kg)  02/08/17 141 lb (64 kg)  03/02/16 148 lb (67.1 kg)    Physical Exam   Constitutional: + appropriate weight for height, not in acute distress, + anxious state of mind Eyes: PERRLA, EOMI, no exophthalmos ENT: moist mucous membranes, + palpable thyroid with mild enlargement, no cervical lymphadenopathy Cardiovascular: normal precordial activity, Regular Rate and Rhythm, no Murmur/Rubs/Gallops Respiratory:  adequate breathing efforts, no gross chest deformity, Clear to auscultation bilaterally Gastrointestinal: abdomen soft, Non -tender, No distension, Bowel Sounds present Musculoskeletal: no gross deformities, strength intact in all four extremities Skin: moist, warm, no rashes Neurological: ++ tremor with  outstretched hands, ++ Deep tendon reflexes normal in bilateral lower extremities.  CMP ( most recent) CMP     Component Value Date/Time   NA 143 11/02/2010 0916   K 3.7 11/02/2010 0916   CL 107 11/02/2010 0916   CO2 25 11/02/2010 0916   GLUCOSE 111 (H) 11/02/2010 0916   BUN 16 11/02/2010 0916   CREATININE 0.93 11/02/2010  0916   CALCIUM 11.2 (H) 11/02/2010 0916   PROT 7.9 11/02/2010 0916   ALBUMIN 4.4 11/02/2010 0916   AST 18 11/02/2010 0916   ALT 17 11/02/2010 0916   ALKPHOS 73 11/02/2010 0916   BILITOT 0.7 11/02/2010 0916   GFRNONAA >60 11/02/2010 0916   GFRAA >60 11/02/2010 0916   Thyroid function tests: 01/11/2017 TSH suppressed at 0.19, total T4 normal at 8.6, total T3 normal at 117 08/10/2016 TSH suppressed at 0.253 07/11/2016 TSH suppressed at 0.27  Thyroid uptake and scan on 02/18/2017 showed 24 hour uptake of 29% with no focal increased or decreased activities.  Assessment & Plan:   1. Subclinical hyperthyroidism -Her clinical presentation is still consistent with subclinical hyperthyroidism. Thyroid uptake and scan is unremarkable at 29% with no focal abnormality. - She will not require antithyroid intervention at this time. - She will return in 6 months with repeat thyroid function test in more complete set. 2. Hypertension- uncontrolled - On 2 visit she had with me her blood pressure was about target. I advised her to increase her lisinopril to 20 mg by mouth daily along with amlodipine 10 mg by mouth daily. - I advised patient to maintain close follow up with Assunta Found, MD for primary care needs. Follow up plan: Return in about 6 months (around 08/24/2017) for follow up with pre-visit labs.  Marquis Lunch, MD [facility] Phone: [phone]  Fax: [phone]   02/23/2017, 8:59 AM This note was partially dictated with voice recognition software. Similar sounding words can be transcribed inadequately or may not  be corrected upon review.

## 2017-04-08 DIAGNOSIS — F17219 Nicotine dependence, cigarettes, with unspecified nicotine-induced disorders: Secondary | ICD-10-CM | POA: Diagnosis not present

## 2017-04-08 DIAGNOSIS — Z716 Tobacco abuse counseling: Secondary | ICD-10-CM | POA: Diagnosis not present

## 2017-06-03 DIAGNOSIS — I10 Essential (primary) hypertension: Secondary | ICD-10-CM | POA: Diagnosis not present

## 2017-06-03 DIAGNOSIS — Z716 Tobacco abuse counseling: Secondary | ICD-10-CM | POA: Diagnosis not present

## 2017-06-03 DIAGNOSIS — F17219 Nicotine dependence, cigarettes, with unspecified nicotine-induced disorders: Secondary | ICD-10-CM | POA: Diagnosis not present

## 2017-06-03 DIAGNOSIS — Z008 Encounter for other general examination: Secondary | ICD-10-CM | POA: Diagnosis not present

## 2017-06-03 DIAGNOSIS — E0590 Thyrotoxicosis, unspecified without thyrotoxic crisis or storm: Secondary | ICD-10-CM | POA: Diagnosis not present

## 2017-06-03 DIAGNOSIS — Z719 Counseling, unspecified: Secondary | ICD-10-CM | POA: Diagnosis not present

## 2017-06-17 DIAGNOSIS — Z716 Tobacco abuse counseling: Secondary | ICD-10-CM | POA: Diagnosis not present

## 2017-06-17 DIAGNOSIS — F17219 Nicotine dependence, cigarettes, with unspecified nicotine-induced disorders: Secondary | ICD-10-CM | POA: Diagnosis not present

## 2017-07-15 DIAGNOSIS — R030 Elevated blood-pressure reading, without diagnosis of hypertension: Secondary | ICD-10-CM | POA: Diagnosis not present

## 2017-07-15 DIAGNOSIS — F17219 Nicotine dependence, cigarettes, with unspecified nicotine-induced disorders: Secondary | ICD-10-CM | POA: Diagnosis not present

## 2017-07-15 DIAGNOSIS — Z716 Tobacco abuse counseling: Secondary | ICD-10-CM | POA: Diagnosis not present

## 2017-07-30 DIAGNOSIS — Z1389 Encounter for screening for other disorder: Secondary | ICD-10-CM | POA: Diagnosis not present

## 2017-07-30 DIAGNOSIS — Z6825 Body mass index (BMI) 25.0-25.9, adult: Secondary | ICD-10-CM | POA: Diagnosis not present

## 2017-07-30 DIAGNOSIS — E0590 Thyrotoxicosis, unspecified without thyrotoxic crisis or storm: Secondary | ICD-10-CM | POA: Diagnosis not present

## 2017-07-30 DIAGNOSIS — I10 Essential (primary) hypertension: Secondary | ICD-10-CM | POA: Diagnosis not present

## 2017-08-24 ENCOUNTER — Ambulatory Visit: Payer: BLUE CROSS/BLUE SHIELD | Admitting: "Endocrinology

## 2017-09-09 DIAGNOSIS — Z716 Tobacco abuse counseling: Secondary | ICD-10-CM | POA: Diagnosis not present

## 2017-09-09 DIAGNOSIS — F17219 Nicotine dependence, cigarettes, with unspecified nicotine-induced disorders: Secondary | ICD-10-CM | POA: Diagnosis not present

## 2017-11-18 DIAGNOSIS — F17219 Nicotine dependence, cigarettes, with unspecified nicotine-induced disorders: Secondary | ICD-10-CM | POA: Diagnosis not present

## 2017-11-18 DIAGNOSIS — Z7189 Other specified counseling: Secondary | ICD-10-CM | POA: Diagnosis not present

## 2017-11-18 DIAGNOSIS — Z008 Encounter for other general examination: Secondary | ICD-10-CM | POA: Diagnosis not present

## 2017-11-18 DIAGNOSIS — Z719 Counseling, unspecified: Secondary | ICD-10-CM | POA: Diagnosis not present

## 2017-11-18 DIAGNOSIS — Z716 Tobacco abuse counseling: Secondary | ICD-10-CM | POA: Diagnosis not present

## 2017-12-30 DIAGNOSIS — Z716 Tobacco abuse counseling: Secondary | ICD-10-CM | POA: Diagnosis not present

## 2017-12-30 DIAGNOSIS — F17219 Nicotine dependence, cigarettes, with unspecified nicotine-induced disorders: Secondary | ICD-10-CM | POA: Diagnosis not present

## 2018-02-01 DIAGNOSIS — Z23 Encounter for immunization: Secondary | ICD-10-CM | POA: Diagnosis not present

## 2018-02-15 DIAGNOSIS — F17219 Nicotine dependence, cigarettes, with unspecified nicotine-induced disorders: Secondary | ICD-10-CM | POA: Diagnosis not present

## 2018-02-15 DIAGNOSIS — Z716 Tobacco abuse counseling: Secondary | ICD-10-CM | POA: Diagnosis not present

## 2018-05-10 DIAGNOSIS — Z716 Tobacco abuse counseling: Secondary | ICD-10-CM | POA: Diagnosis not present

## 2018-05-10 DIAGNOSIS — F17219 Nicotine dependence, cigarettes, with unspecified nicotine-induced disorders: Secondary | ICD-10-CM | POA: Diagnosis not present

## 2018-05-16 DIAGNOSIS — Z1389 Encounter for screening for other disorder: Secondary | ICD-10-CM | POA: Diagnosis not present

## 2018-05-16 DIAGNOSIS — E663 Overweight: Secondary | ICD-10-CM | POA: Diagnosis not present

## 2018-05-16 DIAGNOSIS — I10 Essential (primary) hypertension: Secondary | ICD-10-CM | POA: Diagnosis not present

## 2018-05-16 DIAGNOSIS — Z6825 Body mass index (BMI) 25.0-25.9, adult: Secondary | ICD-10-CM | POA: Diagnosis not present

## 2018-05-24 DIAGNOSIS — F17219 Nicotine dependence, cigarettes, with unspecified nicotine-induced disorders: Secondary | ICD-10-CM | POA: Diagnosis not present

## 2018-05-24 DIAGNOSIS — Z716 Tobacco abuse counseling: Secondary | ICD-10-CM | POA: Diagnosis not present

## 2018-06-07 DIAGNOSIS — F17219 Nicotine dependence, cigarettes, with unspecified nicotine-induced disorders: Secondary | ICD-10-CM | POA: Diagnosis not present

## 2018-06-07 DIAGNOSIS — Z716 Tobacco abuse counseling: Secondary | ICD-10-CM | POA: Diagnosis not present

## 2018-09-13 IMAGING — MG 2D DIGITAL SCREENING BILATERAL MAMMOGRAM WITH CAD AND ADJUNCT TO
9 series · 9 of 25 positions shown · non-contrast
Comparison: None.

CLINICAL DATA: Screening.

EXAM:
2D DIGITAL SCREENING BILATERAL MAMMOGRAM WITH CAD AND ADJUNCT TOMO

[L MLO (1 of 2)]
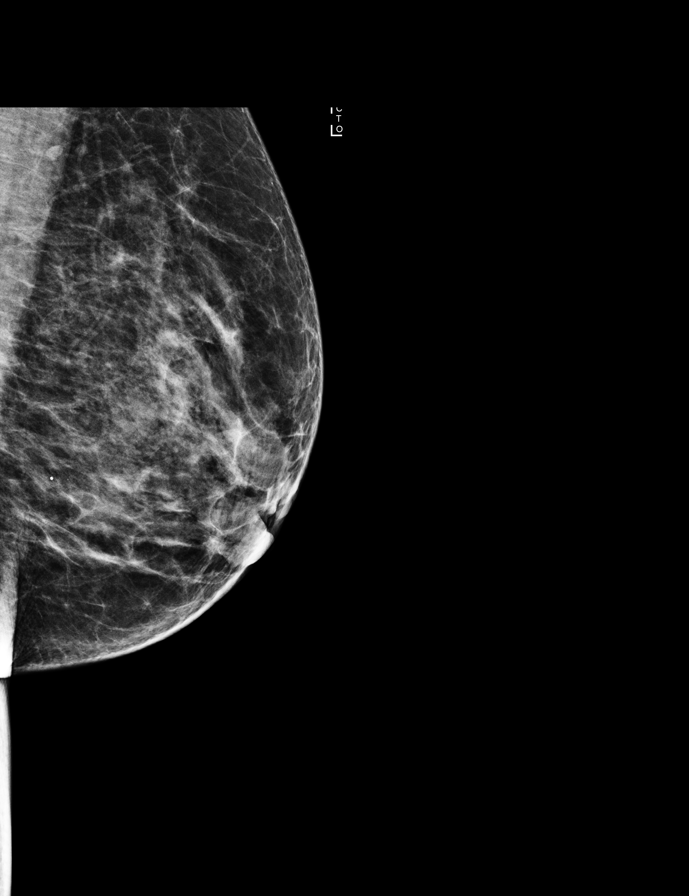

[R MLO]
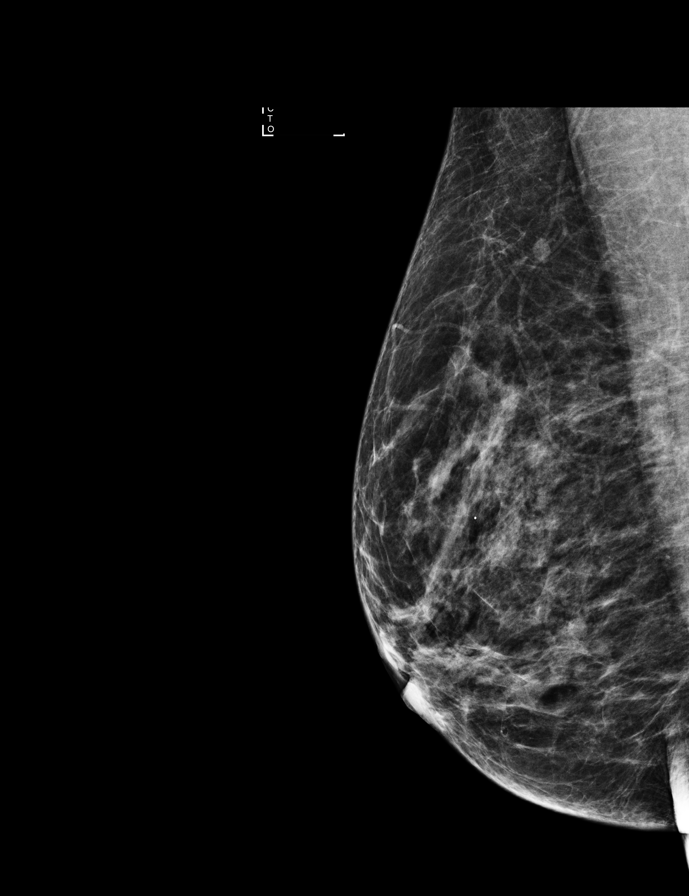

[R CC]
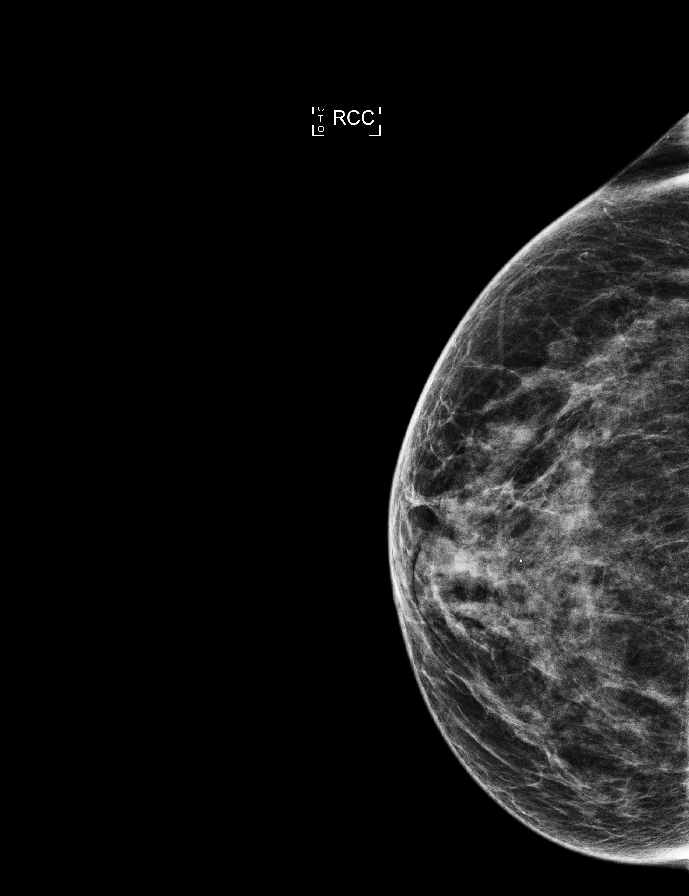

[L MLO (2 of 2)]
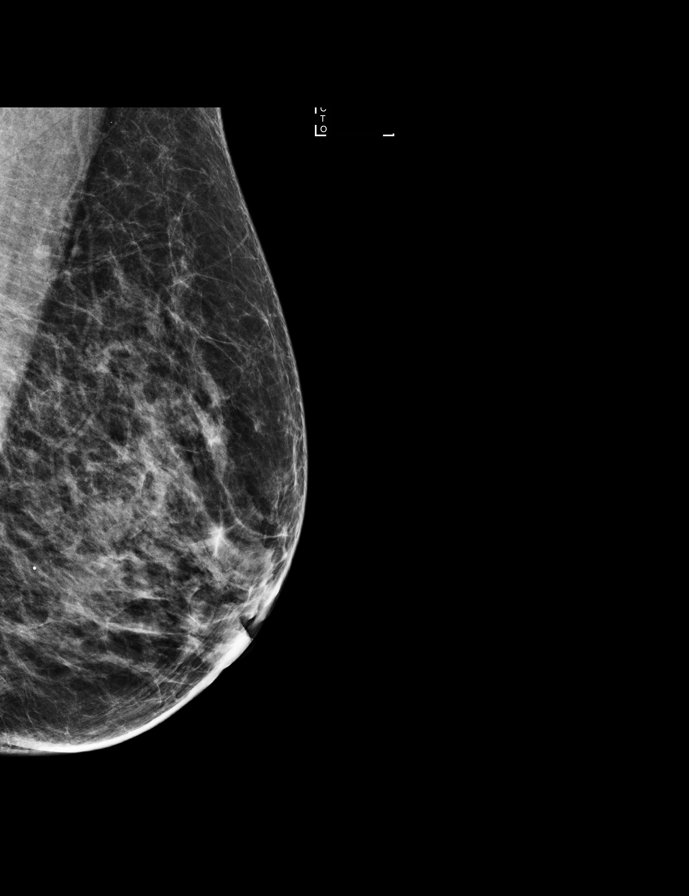

[L CC]
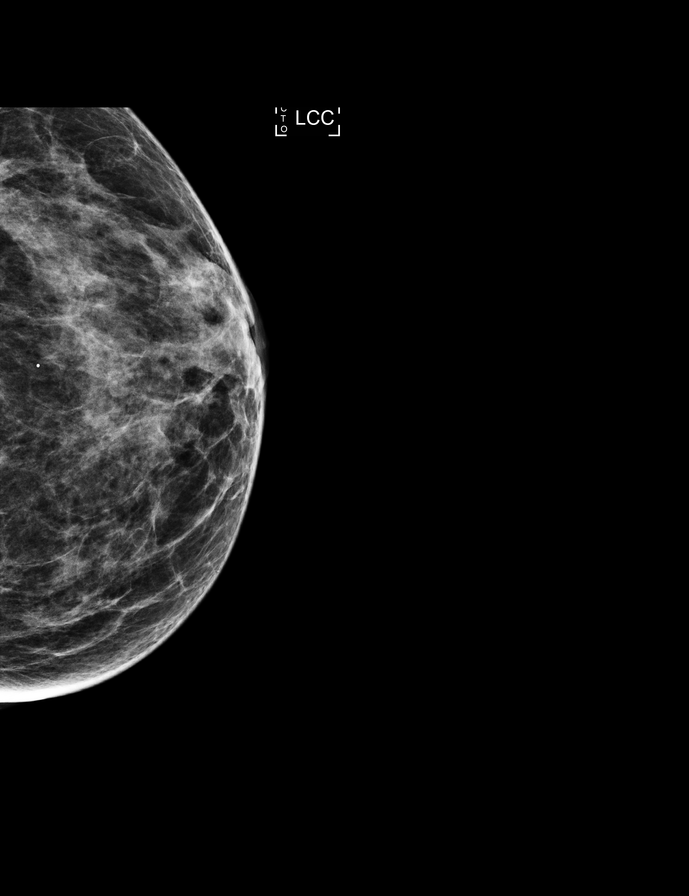

[R MLO tomo · tomo slice 27/52.0]
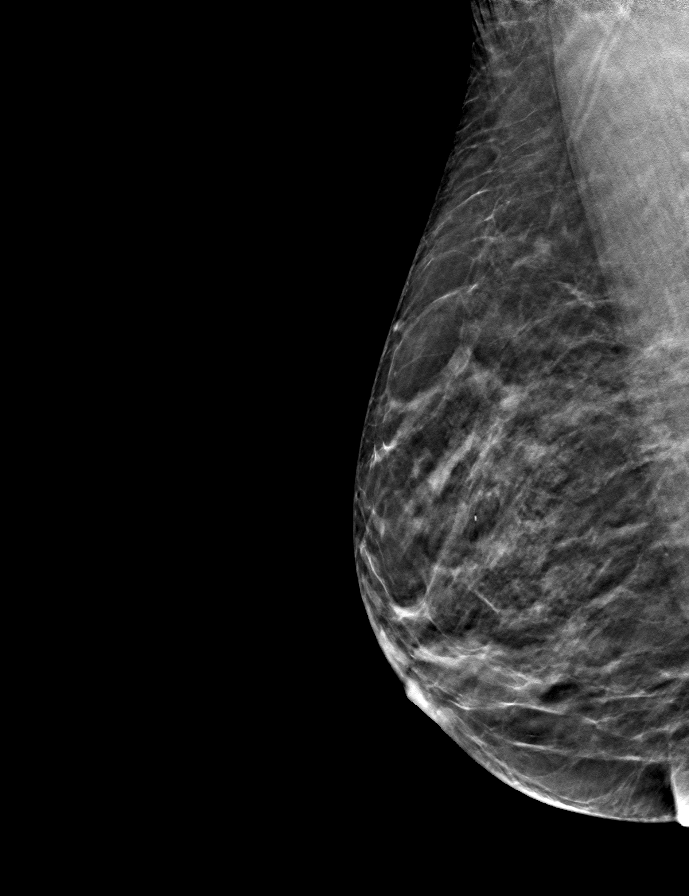

[R CC tomo · tomo slice 25/49.0]
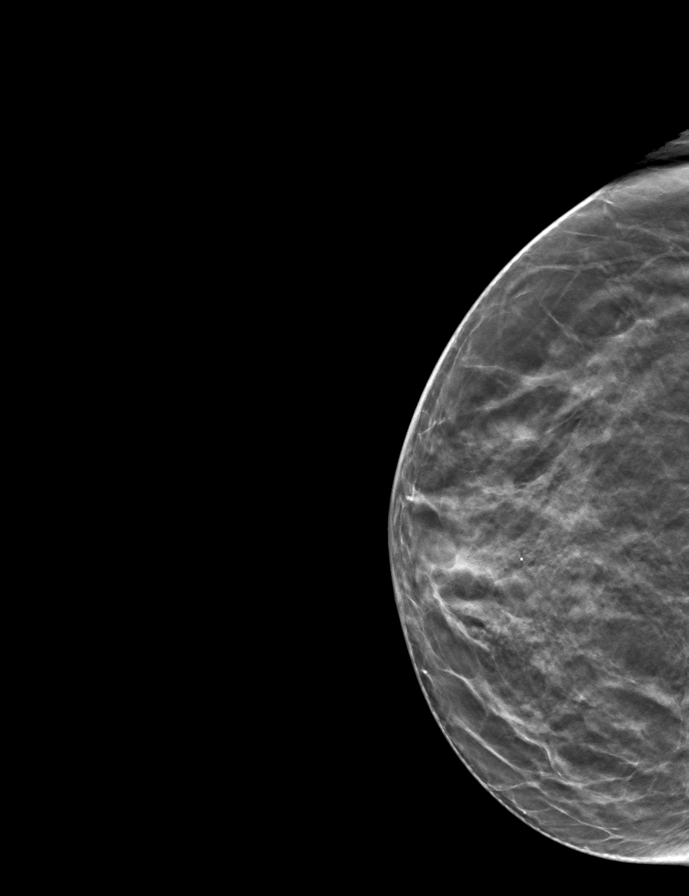

[L CC tomo · tomo slice 25/48.0]
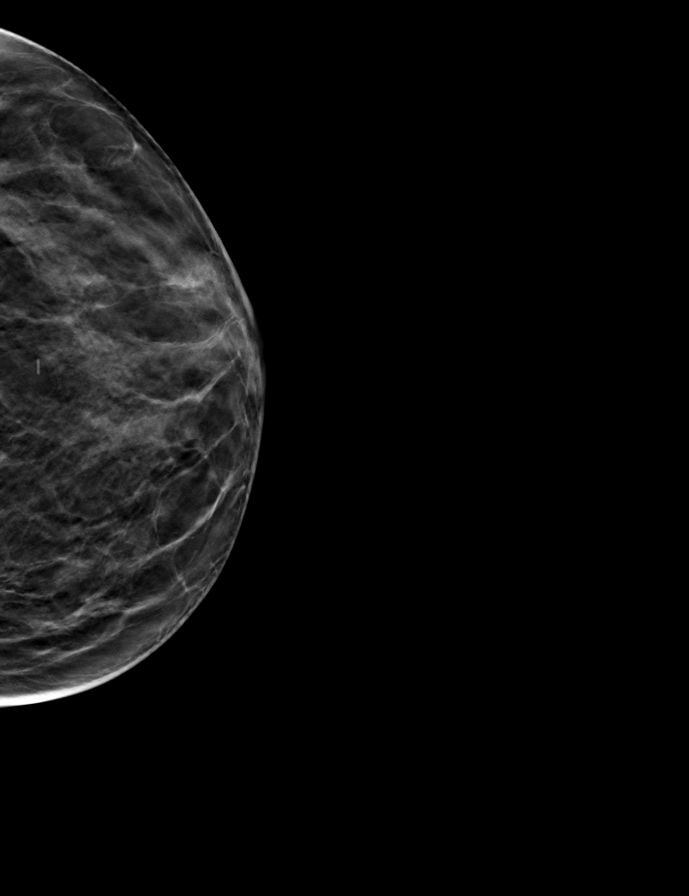

[L MLO tomo · tomo slice 27/52.0]
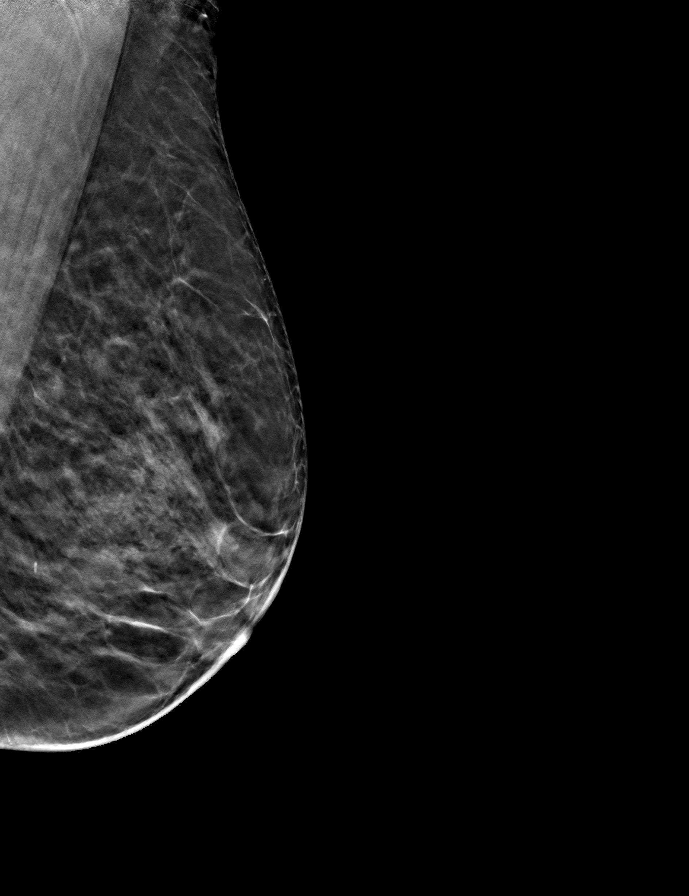

[9 of 25 positions shown; findings below may reference images not displayed]

ACR Breast Density Category c: The breast tissue is heterogeneously
dense, which may obscure small masses
FINDINGS: There are no findings suspicious for malignancy. Images were
processed with CAD.
IMPRESSION: No mammographic evidence of malignancy. A result letter of this
screening mammogram will be mailed directly to the patient.

RECOMMENDATION:
Screening mammogram in one year. (Code:KQ-2-WJT)

BI-RADS CATEGORY  1: Negative.

## 2018-10-25 DIAGNOSIS — F17219 Nicotine dependence, cigarettes, with unspecified nicotine-induced disorders: Secondary | ICD-10-CM | POA: Diagnosis not present

## 2018-10-25 DIAGNOSIS — Z716 Tobacco abuse counseling: Secondary | ICD-10-CM | POA: Diagnosis not present

## 2019-01-26 DIAGNOSIS — F17219 Nicotine dependence, cigarettes, with unspecified nicotine-induced disorders: Secondary | ICD-10-CM | POA: Diagnosis not present

## 2019-01-26 DIAGNOSIS — Z716 Tobacco abuse counseling: Secondary | ICD-10-CM | POA: Diagnosis not present

## 2019-02-16 DIAGNOSIS — Z23 Encounter for immunization: Secondary | ICD-10-CM | POA: Diagnosis not present

## 2019-02-28 DIAGNOSIS — Z79899 Other long term (current) drug therapy: Secondary | ICD-10-CM | POA: Diagnosis not present

## 2019-02-28 DIAGNOSIS — Z139 Encounter for screening, unspecified: Secondary | ICD-10-CM | POA: Diagnosis not present

## 2019-02-28 DIAGNOSIS — E559 Vitamin D deficiency, unspecified: Secondary | ICD-10-CM | POA: Diagnosis not present

## 2019-02-28 DIAGNOSIS — I10 Essential (primary) hypertension: Secondary | ICD-10-CM | POA: Diagnosis not present

## 2019-02-28 DIAGNOSIS — E0590 Thyrotoxicosis, unspecified without thyrotoxic crisis or storm: Secondary | ICD-10-CM | POA: Diagnosis not present

## 2019-02-28 DIAGNOSIS — Z7189 Other specified counseling: Secondary | ICD-10-CM | POA: Diagnosis not present

## 2019-03-14 DIAGNOSIS — E559 Vitamin D deficiency, unspecified: Secondary | ICD-10-CM | POA: Diagnosis not present

## 2019-03-14 DIAGNOSIS — E0590 Thyrotoxicosis, unspecified without thyrotoxic crisis or storm: Secondary | ICD-10-CM | POA: Diagnosis not present

## 2019-04-11 DIAGNOSIS — Z719 Counseling, unspecified: Secondary | ICD-10-CM | POA: Diagnosis not present

## 2019-04-11 DIAGNOSIS — Z716 Tobacco abuse counseling: Secondary | ICD-10-CM | POA: Diagnosis not present

## 2019-04-11 DIAGNOSIS — F17219 Nicotine dependence, cigarettes, with unspecified nicotine-induced disorders: Secondary | ICD-10-CM | POA: Diagnosis not present

## 2019-04-12 ENCOUNTER — Other Ambulatory Visit (HOSPITAL_COMMUNITY): Payer: Self-pay | Admitting: Physician Assistant

## 2019-04-12 DIAGNOSIS — Z1231 Encounter for screening mammogram for malignant neoplasm of breast: Secondary | ICD-10-CM

## 2019-05-01 ENCOUNTER — Ambulatory Visit (HOSPITAL_COMMUNITY)
Admission: RE | Admit: 2019-05-01 | Discharge: 2019-05-01 | Disposition: A | Discharge status: Home / Self Care | Payer: BC Managed Care – PPO | Source: Ambulatory Visit | Attending: Physician Assistant | Admitting: Physician Assistant

## 2019-05-01 ENCOUNTER — Other Ambulatory Visit: Payer: Self-pay

## 2019-05-01 DIAGNOSIS — Z1231 Encounter for screening mammogram for malignant neoplasm of breast: Secondary | ICD-10-CM | POA: Diagnosis not present

## 2019-05-03 ENCOUNTER — Other Ambulatory Visit: Payer: Self-pay | Admitting: Internal Medicine

## 2019-05-03 ENCOUNTER — Other Ambulatory Visit (HOSPITAL_COMMUNITY): Payer: Self-pay | Admitting: Internal Medicine

## 2019-05-03 ENCOUNTER — Other Ambulatory Visit: Payer: Self-pay

## 2019-05-03 ENCOUNTER — Ambulatory Visit (HOSPITAL_COMMUNITY)
Admission: RE | Admit: 2019-05-03 | Discharge: 2019-05-03 | Disposition: A | Discharge status: Home / Self Care | Payer: BC Managed Care – PPO | Source: Ambulatory Visit | Attending: Internal Medicine | Admitting: Internal Medicine

## 2019-05-03 DIAGNOSIS — R319 Hematuria, unspecified: Secondary | ICD-10-CM | POA: Diagnosis not present

## 2019-05-03 DIAGNOSIS — E063 Autoimmune thyroiditis: Secondary | ICD-10-CM | POA: Diagnosis not present

## 2019-05-03 DIAGNOSIS — Z6825 Body mass index (BMI) 25.0-25.9, adult: Secondary | ICD-10-CM | POA: Diagnosis not present

## 2019-05-03 DIAGNOSIS — I10 Essential (primary) hypertension: Secondary | ICD-10-CM | POA: Diagnosis not present

## 2019-05-03 DIAGNOSIS — S2242XA Multiple fractures of ribs, left side, initial encounter for closed fracture: Secondary | ICD-10-CM | POA: Diagnosis not present

## 2019-05-03 DIAGNOSIS — S3991XA Unspecified injury of abdomen, initial encounter: Secondary | ICD-10-CM | POA: Diagnosis not present

## 2019-05-03 DIAGNOSIS — R109 Unspecified abdominal pain: Secondary | ICD-10-CM | POA: Diagnosis not present

## 2019-05-03 LAB — POCT I-STAT CREATININE: Creatinine, Ser: 1.1 mg/dL — ABNORMAL HIGH (ref 0.44–1.00)

## 2019-05-03 MED ORDER — IOHEXOL 300 MG/ML  SOLN
150.0000 mL | Freq: Once | INTRAMUSCULAR | Status: AC | PRN
  Administered 2019-05-03: 125 mL via INTRAVENOUS

## 2019-05-08 DIAGNOSIS — R319 Hematuria, unspecified: Secondary | ICD-10-CM | POA: Diagnosis not present

## 2019-05-09 ENCOUNTER — Other Ambulatory Visit (HOSPITAL_COMMUNITY): Payer: Self-pay | Admitting: Internal Medicine

## 2019-05-09 DIAGNOSIS — S299XXA Unspecified injury of thorax, initial encounter: Secondary | ICD-10-CM

## 2019-05-18 DIAGNOSIS — F17219 Nicotine dependence, cigarettes, with unspecified nicotine-induced disorders: Secondary | ICD-10-CM | POA: Diagnosis not present

## 2019-05-18 DIAGNOSIS — Z716 Tobacco abuse counseling: Secondary | ICD-10-CM | POA: Diagnosis not present

## 2019-05-18 DIAGNOSIS — Z719 Counseling, unspecified: Secondary | ICD-10-CM | POA: Diagnosis not present

## 2019-06-15 DIAGNOSIS — Z716 Tobacco abuse counseling: Secondary | ICD-10-CM | POA: Diagnosis not present

## 2019-06-15 DIAGNOSIS — F17219 Nicotine dependence, cigarettes, with unspecified nicotine-induced disorders: Secondary | ICD-10-CM | POA: Diagnosis not present

## 2019-07-13 DIAGNOSIS — F17219 Nicotine dependence, cigarettes, with unspecified nicotine-induced disorders: Secondary | ICD-10-CM | POA: Diagnosis not present

## 2019-07-13 DIAGNOSIS — Z719 Counseling, unspecified: Secondary | ICD-10-CM | POA: Diagnosis not present

## 2019-07-13 DIAGNOSIS — Z716 Tobacco abuse counseling: Secondary | ICD-10-CM | POA: Diagnosis not present

## 2019-08-24 DIAGNOSIS — F17219 Nicotine dependence, cigarettes, with unspecified nicotine-induced disorders: Secondary | ICD-10-CM | POA: Diagnosis not present

## 2019-08-24 DIAGNOSIS — Z008 Encounter for other general examination: Secondary | ICD-10-CM | POA: Diagnosis not present

## 2019-08-24 DIAGNOSIS — Z716 Tobacco abuse counseling: Secondary | ICD-10-CM | POA: Diagnosis not present

## 2019-08-24 DIAGNOSIS — E0590 Thyrotoxicosis, unspecified without thyrotoxic crisis or storm: Secondary | ICD-10-CM | POA: Diagnosis not present

## 2019-08-24 DIAGNOSIS — E559 Vitamin D deficiency, unspecified: Secondary | ICD-10-CM | POA: Diagnosis not present

## 2019-08-24 DIAGNOSIS — Z719 Counseling, unspecified: Secondary | ICD-10-CM | POA: Diagnosis not present

## 2020-03-22 ENCOUNTER — Other Ambulatory Visit (HOSPITAL_COMMUNITY): Payer: Self-pay | Admitting: Physician Assistant

## 2020-03-22 DIAGNOSIS — Z1231 Encounter for screening mammogram for malignant neoplasm of breast: Secondary | ICD-10-CM

## 2020-05-10 DIAGNOSIS — R7309 Other abnormal glucose: Secondary | ICD-10-CM | POA: Diagnosis not present

## 2020-12-04 DIAGNOSIS — Z6825 Body mass index (BMI) 25.0-25.9, adult: Secondary | ICD-10-CM | POA: Diagnosis not present

## 2020-12-04 DIAGNOSIS — I10 Essential (primary) hypertension: Secondary | ICD-10-CM | POA: Diagnosis not present

## 2020-12-04 DIAGNOSIS — Z0000 Encounter for general adult medical examination without abnormal findings: Secondary | ICD-10-CM | POA: Diagnosis not present

## 2020-12-04 DIAGNOSIS — E663 Overweight: Secondary | ICD-10-CM | POA: Diagnosis not present

## 2020-12-04 DIAGNOSIS — Z1331 Encounter for screening for depression: Secondary | ICD-10-CM | POA: Diagnosis not present

## 2021-09-01 ENCOUNTER — Other Ambulatory Visit (HOSPITAL_COMMUNITY): Payer: Self-pay | Admitting: Family Medicine

## 2021-09-01 DIAGNOSIS — Z1231 Encounter for screening mammogram for malignant neoplasm of breast: Secondary | ICD-10-CM

## 2021-09-10 ENCOUNTER — Ambulatory Visit (HOSPITAL_COMMUNITY)
Admission: RE | Admit: 2021-09-10 | Discharge: 2021-09-10 | Disposition: A | Discharge status: Home / Self Care | Payer: BC Managed Care – PPO | Source: Ambulatory Visit | Attending: Family Medicine | Admitting: Family Medicine

## 2021-09-10 DIAGNOSIS — Z1231 Encounter for screening mammogram for malignant neoplasm of breast: Secondary | ICD-10-CM | POA: Insufficient documentation

## 2022-02-06 DIAGNOSIS — Z6824 Body mass index (BMI) 24.0-24.9, adult: Secondary | ICD-10-CM | POA: Diagnosis not present

## 2022-02-06 DIAGNOSIS — F17200 Nicotine dependence, unspecified, uncomplicated: Secondary | ICD-10-CM | POA: Diagnosis not present

## 2022-02-06 DIAGNOSIS — Z0000 Encounter for general adult medical examination without abnormal findings: Secondary | ICD-10-CM | POA: Diagnosis not present

## 2022-02-06 DIAGNOSIS — Z1389 Encounter for screening for other disorder: Secondary | ICD-10-CM | POA: Diagnosis not present

## 2022-02-06 DIAGNOSIS — E785 Hyperlipidemia, unspecified: Secondary | ICD-10-CM | POA: Diagnosis not present

## 2022-02-06 DIAGNOSIS — I10 Essential (primary) hypertension: Secondary | ICD-10-CM | POA: Diagnosis not present

## 2022-02-06 DIAGNOSIS — Z23 Encounter for immunization: Secondary | ICD-10-CM | POA: Diagnosis not present

## 2022-02-06 DIAGNOSIS — Z1331 Encounter for screening for depression: Secondary | ICD-10-CM | POA: Diagnosis not present

## 2022-02-06 DIAGNOSIS — E119 Type 2 diabetes mellitus without complications: Secondary | ICD-10-CM | POA: Diagnosis not present

## 2022-02-07 LAB — TSH: TSH: 0.42 (ref 0.41–5.90)

## 2022-03-04 NOTE — Patient Instructions (Signed)
Thyroid-Stimulating Hormone Test Why am I having this test? The thyroid is a gland in the lower front of the neck. It makes hormones that affect many body parts and systems, including the system that affects how quickly the body burns fuel for energy (metabolism). The pituitary gland is located just below the brain, behind the eyes and nasal passages. It helps maintain thyroid hormone levels and thyroid gland function. You may have a thyroid-stimulating hormone (TSH) test if you have possible symptoms of abnormal thyroid hormone levels. This test can help your health care provider: Diagnose a disorder of the thyroid gland or pituitary gland. Manage your condition and treatment if you have an underactive thyroid (hypothyroidism) or an overactive thyroid (hyperthyroidism). Newborn babies may have this test done to screen for hypothyroidism that is present at birth (congenital). What is being tested? This test measures the amount of TSH in your blood. TSH may also be called thyrotropin. When the thyroid does not make enough hormones, the pituitary gland releases TSH into the bloodstream to stimulate the thyroid gland to make more hormones. What kind of sample is taken?     A blood sample is required for this test. It is usually collected by inserting a needle into a blood vessel. For newborns, a small amount of blood may be collected from the umbilical cord, or by using a small needle to prick the baby's heel (heel stick). Tell a health care provider about: All medicines you are taking, including vitamins, herbs, eye drops, creams, and over-the-counter medicines. Any bleeding problems you have. Any surgeries you have had. Any medical conditions you have. Whether you are pregnant or may be pregnant. How are the results reported? Your test results will be reported as a value that indicates how much TSH is in your blood. Your health care provider will compare your results to normal ranges that were  established after testing a large group of people (reference ranges). Reference ranges may vary among labs and hospitals. For this test, common reference ranges are: Adult: 2-10 microunits/mL or 2-10 milliunits/L. Newborn: Heel stick: 3-18 microunits/mL or 3-18 milliunits/L. Umbilical cord: 3-12 microunits/mL or 3-12 milliunits/L. What do the results mean? Results that are within the reference range are considered normal. This means that you have a normal amount of TSH in your blood. Results that are higher than the reference range mean that your TSH levels are too high. This may mean: Your thyroid gland is not making enough thyroid hormones. Your thyroid medicine dosage is too low. You have a tumor on your pituitary gland. This is rare. Results that are lower than the reference range mean that your TSH levels are too low. This may be caused by hyperthyroidism or by a problem with the pituitary gland function. Talk with your health care provider about what your results mean. Questions to ask your health care provider Ask your health care provider, or the department that is doing the test: When will my results be ready? How will I get my results? What are my treatment options? What other tests do I need? What are my next steps? Summary You may have a thyroid-stimulating hormone (TSH) test if you have possible symptoms of abnormal thyroid hormone levels. The thyroid is a gland in the lower front of the neck. It makes hormones that affect many body parts and systems. The pituitary gland is located just below the brain, behind the eyes and nasal passages. It helps maintain thyroid hormone levels and thyroid gland function. This test   measures the amount of TSH in your blood. TSH is made by the pituitary gland. It may also be called thyrotropin. This information is not intended to replace advice given to you by your health care provider. Make sure you discuss any questions you have with your  health care provider. Document Revised: 04/22/2021 Document Reviewed: 04/22/2021 Elsevier Patient Education  2023 [employer] Inc[employer]  

## 2022-03-05 ENCOUNTER — Ambulatory Visit (INDEPENDENT_AMBULATORY_CARE_PROVIDER_SITE_OTHER): Payer: BC Managed Care – PPO | Admitting: Nurse Practitioner

## 2022-03-05 ENCOUNTER — Encounter: Payer: Self-pay | Admitting: Nurse Practitioner

## 2022-03-05 VITALS — BP 145/78 | HR 96 | Ht 64.0 in | Wt 147.6 lb

## 2022-03-05 DIAGNOSIS — R7989 Other specified abnormal findings of blood chemistry: Secondary | ICD-10-CM

## 2022-03-05 NOTE — Progress Notes (Signed)
03/05/2022     Endocrinology Consult Note    Subjective:    Patient ID: Betty Zamora, female    DOB: 06/17/1955, PCP Sharilyn Sites, MD.   Past Medical History:  Diagnosis Date   Hypertension     Past Surgical History:  Procedure Laterality Date   ABDOMINAL HYSTERECTOMY      Social History   Socioeconomic History   Marital status: Single    Spouse name: Not on file   Number of children: Not on file   Years of education: Not on file   Highest education level: Not on file  Occupational History   Not on file  Tobacco Use   Smoking status: Former    Packs/day: 0.50    Types: Cigarettes   Smokeless tobacco: Never  Vaping Use   Vaping Use: Never used  Substance and Sexual Activity   Alcohol use: Yes    Comment: occasionally   Drug use: No   Sexual activity: Not on file  Other Topics Concern   Not on file  Social History Narrative   Not on file   Social Determinants of Health   Financial Resource Strain: Not on file  Food Insecurity: Not on file  Transportation Needs: Not on file  Physical Activity: Not on file  Stress: Not on file  Social Connections: Not on file    History reviewed. No pertinent family history.  Outpatient Encounter Medications as of 03/05/2022  Medication Sig   amLODipine (NORVASC) 10 MG tablet Take 10 mg by mouth daily.   [DISCONTINUED] lisinopril (PRINIVIL,ZESTRIL) 20 MG tablet Take 1 tablet (20 mg total) by mouth daily. (Patient not taking: Reported on 03/05/2022)   [DISCONTINUED] VITAMIN D, ERGOCALCIFEROL, PO Take by mouth daily. (Patient not taking: Reported on 03/05/2022)   No facility-administered encounter medications on file as of 03/05/2022.    ALLERGIES: No Known Allergies  VACCINATION STATUS: Immunization History  Administered Date(s) Administered   Tdap 01/14/2012     HPI  Betty Zamora is 66 y.o. female who presents today with a medical history as above. she is being seen in consultation for  hyperthyroidism requested by Sharilyn Sites, MD.  She was seen in this clinic by Dr. Dorris Fetch in 2018 for subclinical hyperthyroidism.  Her initial work up included uptake and scan which was normal.  She notes she had a nurse at her work recommend she get her PCP to check her calcium levels as it was high during the office labs which prompted her to be seen by her PCP.  She denies any symptoms of hyperthyroidism.  She does have a chronic resting tremor.  her most recent thyroid labs revealed mildly suppressed TSH of 0.42 on 02/07/22.  No corresponding Free T4 level or Free T3 levels drawn at that time.  As for her calcium levels, she reports she does not take any calcium supplements, MVIs, eats green leafy veggies and dairy products in average amounts.  She denies any personal or family history of kidney stones, parathyroid problems, osteoporosis.  No history of fragility fractures or falls.  She has no history of CKD or vitamin D deficiency.  She has had a bone density exam with her employer and reports it was normal (no records available to review).  she denies dysphagia, choking, shortness of breath, no recent voice change.    she denies family history of thyroid dysfunction and denies family hx of thyroid cancer. she denies personal history of goiter. she is not on  any anti-thyroid medications nor on any thyroid hormone supplements (thinks she may have been on a pill for her thyroid in the distant past but cannot recall the details). Denies use of Biotin containing supplements.  she is willing to proceed with appropriate work up and therapy for thyrotoxicosis.   Review of systems  Constitutional: + Minimally fluctuating body weight, current Body mass index is 25.34 kg/m., no fatigue, no subjective hyperthermia, no subjective hypothermia Eyes: no blurry vision, no xerophthalmia ENT: no sore throat, no nodules palpated in throat, no dysphagia/odynophagia, no hoarseness Cardiovascular: no chest pain, no  shortness of breath, no palpitations, no leg swelling Respiratory: no cough, no shortness of breath Gastrointestinal: no nausea/vomiting/diarrhea Musculoskeletal: no muscle/joint aches Skin: no rashes, no hyperemia Neurological: + chronic hand tremors, no numbness, no tingling, no dizziness Psychiatric: no depression, no anxiety   Objective:    BP (!) 145/78 (BP Location: Right Arm, Patient Position: Sitting, Cuff Size: Normal)   Pulse 96   Ht 5\' 4"  (1.626 m)   Wt 147 lb 9.6 oz (67 kg)   BMI 25.34 kg/m   Wt Readings from Last 3 Encounters:  03/05/22 147 lb 9.6 oz (67 kg)  02/23/17 141 lb (64 kg)  02/08/17 141 lb (64 kg)     BP Readings from Last 3 Encounters:  03/05/22 (!) 145/78  02/23/17 (!) 149/88  02/08/17 (!) 149/80                       Physical Exam- Limited  Constitutional:  Body mass index is 25.34 kg/m. , not in acute distress, mildly anxious state of mind Eyes:  EOMI, no exophthalmos Neck: Supple Thyroid: No gross goiter, no palpable nodularity Cardiovascular: RRR, no murmurs, rubs, or gallops, no edema Respiratory: Adequate breathing efforts, no crackles, rales, rhonchi, or wheezing Musculoskeletal: no gross deformities, strength intact in all four extremities, no gross restriction of joint movements Skin:  no rashes, no hyperemia Neurological: ++ tremor with outstretched hands, DTR normal in BLE   CMP     Component Value Date/Time   NA 143 11/02/2010 0916   K 3.7 11/02/2010 0916   CL 107 11/02/2010 0916   CO2 25 11/02/2010 0916   GLUCOSE 111 (H) 11/02/2010 0916   BUN 16 11/02/2010 0916   CREATININE 1.10 (H) 05/03/2019 1155   CALCIUM 11.2 (H) 11/02/2010 0916   PROT 7.9 11/02/2010 0916   ALBUMIN 4.4 11/02/2010 0916   AST 18 11/02/2010 0916   ALT 17 11/02/2010 0916   ALKPHOS 73 11/02/2010 0916   BILITOT 0.7 11/02/2010 0916   GFRNONAA >60 11/02/2010 0916   GFRAA >60 11/02/2010 0916     CBC    Component Value Date/Time   WBC 7.1 11/02/2010  0916   RBC 4.25 11/02/2010 0916   HGB 13.5 11/02/2010 0916   HCT 38.8 11/02/2010 0916   PLT 272 11/02/2010 0916   MCV 91.3 11/02/2010 0916   MCH 31.8 11/02/2010 0916   MCHC 34.8 11/02/2010 0916   RDW 12.6 11/02/2010 0916   LYMPHSABS 2.7 11/02/2010 0916   MONOABS 0.5 11/02/2010 0916   EOSABS 0.1 11/02/2010 0916   BASOSABS 0.0 11/02/2010 0916     Diabetic Labs (most recent): No results found for: "HGBA1C", "MICROALBUR"  Lipid Panel  No results found for: "CHOL", "TRIG", "HDL", "CHOLHDL", "VLDL", "LDLCALC", "LDLDIRECT", "LABVLDL"   Lab Results  Component Value Date   TSH 0.42 02/07/2022   TSH 0.19 (A) 01/11/2017   TSH 0.25 (A)  08/10/2016   TSH 0.27 (A) 07/11/2016   TSH 0.207 (L) 10/24/2010   FREET4 1.16 10/24/2010     Uptake and Scan from 02/18/2017 CLINICAL DATA:  Subclinical hyperthyroidism   EXAM: THYROID SCAN AND UPTAKE - 24 HOURS   TECHNIQUE: Following the per oral administration of I-123 sodium iodide, the patient returned at 24 hours and uptake measurements were acquired with the uptake probe centered on the neck. Thyroid imaging was also performed.   RADIOPHARMACEUTICALS:  384 microCuries I-123 sodium iodide orally   COMPARISON:  None   FINDINGS: 24 hour radio iodine uptake is calculated at 29%, within the normal range.   Images of the thyroid gland in 3 projections are normal.   No focal areas of increased or decreased tracer localization seen.   IMPRESSION: Normal 24 hour uptake of 29%.   Normal thyroid scan.     Electronically Signed   By: Ulyses Southward M.D.   On: 02/18/2017 14:37   Latest Reference Range & Units 07/11/16 00:00 08/10/16 00:00 01/11/17 00:00 02/07/22 00:00  TSH 0.41 - 5.90  0.27 ! (E) 0.25 ! (E) 0.19 ! (E) 0.42 (E)  !: Data is abnormal (E): External lab result  Assessment & Plan:   1. Abnormal TSH 2. Subclinical hyperthyroidism   she is being seen at a kind request of Assunta Found, MD.  her history and most recent  labs are reviewed, and she was examined clinically. Subjective and objective findings are inconsistent with thyrotoxicosis from primary hyperthyroidism. However, more information is needed to identify any possible thyroid dysfunction.  The potential risks of untreated thyrotoxicosis and the need for definitive therapy have been discussed in detail with her, and she agrees to proceed with diagnostic workup and treatment plan.   I will repeat full profile thyroid function tests today (including antibody testing to assess for autoimmune thyroid dysfunction).  She will not need repeat uptake and scan at this time.  I did order thyroid ultrasound to assess baseline thyroid anatomy.   3. Hypercalcemia I did add some additional labs to help identify possible etiology for her high blood calcium levels including PTH, PTH-rp, Calcium, CMP, Magnesium, Phosphorous, Vitamin D.   I did not order 24 hour urine studies just yet.  Will wait on preliminary results first.    she will return in 4 weeks for treatment decision.      -Patient is advised to maintain close follow up with Assunta Found, MD for primary care needs.   - Time spent with the patient: 60 minutes, of which >50% was spent in obtaining information about her symptoms, reviewing her previous labs, evaluations, and treatments, counseling her about her hyperthyroidism , and developing a plan to confirm the diagnosis and long term treatment as necessary. Please refer to "Patient Self Inventory" in the Media tab for reviewed elements of pertinent patient history.  Betty Zamora participated in the discussions, expressed understanding, and voiced agreement with the above plans.  All questions were answered to her satisfaction. she is encouraged to contact clinic should she have any questions or concerns prior to her return visit.   Follow up plan: Return in about 4 weeks (around 04/02/2022) for Thyroid follow up, Previsit labs, thyroid  ultrasound.   Thank you for involving me in the care of this pleasant patient, and I will continue to update you with her progress.    Ronny Bacon, [facility] [address] [place], [place] [address] Phone: [phone] Fax: [phone]  03/05/2022,  10:57 AM

## 2022-03-06 LAB — PHOSPHORUS: Phosphorus: 3 mg/dL (ref 3.0–4.3)

## 2022-03-06 LAB — COMPREHENSIVE METABOLIC PANEL
ALT: 26 IU/L (ref 0–32)
AST: 29 IU/L (ref 0–40)
Albumin/Globulin Ratio: 2.1 (ref 1.2–2.2)
Albumin: 4.8 g/dL (ref 3.9–4.9)
Alkaline Phosphatase: 88 IU/L (ref 44–121)
BUN/Creatinine Ratio: 13 (ref 12–28)
BUN: 11 mg/dL (ref 8–27)
Bilirubin Total: 0.8 mg/dL (ref 0.0–1.2)
CO2: 22 mmol/L (ref 20–29)
Calcium: 10.6 mg/dL — ABNORMAL HIGH (ref 8.7–10.3)
Chloride: 104 mmol/L (ref 96–106)
Creatinine, Ser: 0.87 mg/dL (ref 0.57–1.00)
Globulin, Total: 2.3 g/dL (ref 1.5–4.5)
Glucose: 116 mg/dL — ABNORMAL HIGH (ref 70–99)
Potassium: 3.7 mmol/L (ref 3.5–5.2)
Sodium: 143 mmol/L (ref 134–144)
Total Protein: 7.1 g/dL (ref 6.0–8.5)
eGFR: 74 mL/min/{1.73_m2} (ref >59)

## 2022-03-06 LAB — MAGNESIUM: Magnesium: 1.8 mg/dL (ref 1.6–2.3)

## 2022-03-06 LAB — VITAMIN D 25 HYDROXY (VIT D DEFICIENCY, FRACTURES): Vit D, 25-Hydroxy: 12.8 ng/mL — ABNORMAL LOW (ref 30.0–100.0)

## 2022-03-06 LAB — THYROGLOBULIN ANTIBODY: Thyroglobulin Antibody: <1 IU/mL (ref 0.0–0.9)

## 2022-03-06 LAB — PTH, INTACT AND CALCIUM: PTH: 91 pg/mL — ABNORMAL HIGH (ref 15–65)

## 2022-03-06 LAB — THYROID PEROXIDASE ANTIBODY: Thyroperoxidase Ab SerPl-aCnc: 14 IU/mL (ref 0–34)

## 2022-03-06 LAB — TSH: TSH: 0.473 u[IU]/mL (ref 0.450–4.500)

## 2022-03-06 LAB — T3, FREE: T3, Free: 3.5 pg/mL (ref 2.0–4.4)

## 2022-03-06 LAB — T4, FREE: Free T4: 1.51 ng/dL (ref 0.82–1.77)

## 2022-03-17 ENCOUNTER — Ambulatory Visit (HOSPITAL_COMMUNITY)
Admission: RE | Admit: 2022-03-17 | Discharge: 2022-03-17 | Disposition: A | Discharge status: Home / Self Care | Payer: BC Managed Care – PPO | Source: Ambulatory Visit | Attending: Nurse Practitioner | Admitting: Nurse Practitioner

## 2022-03-17 DIAGNOSIS — R7989 Other specified abnormal findings of blood chemistry: Secondary | ICD-10-CM | POA: Diagnosis not present

## 2022-03-17 DIAGNOSIS — E041 Nontoxic single thyroid nodule: Secondary | ICD-10-CM | POA: Diagnosis not present

## 2022-04-07 ENCOUNTER — Ambulatory Visit: Payer: BC Managed Care – PPO | Admitting: Nurse Practitioner

## 2022-04-08 ENCOUNTER — Encounter: Payer: Self-pay | Admitting: Nurse Practitioner

## 2022-04-08 ENCOUNTER — Ambulatory Visit (INDEPENDENT_AMBULATORY_CARE_PROVIDER_SITE_OTHER): Payer: BC Managed Care – PPO | Admitting: Nurse Practitioner

## 2022-04-08 VITALS — BP 118/72 | HR 89 | Ht 64.0 in | Wt 144.6 lb

## 2022-04-08 DIAGNOSIS — R7989 Other specified abnormal findings of blood chemistry: Secondary | ICD-10-CM

## 2022-04-08 DIAGNOSIS — E559 Vitamin D deficiency, unspecified: Secondary | ICD-10-CM | POA: Diagnosis not present

## 2022-04-08 MED ORDER — VITAMIN D (ERGOCALCIFEROL) 1.25 MG (50000 UNIT) PO CAPS: 50000.0000 [IU] | ORAL_CAPSULE | ORAL | 1 refills | Status: DC

## 2022-04-08 NOTE — Progress Notes (Signed)
04/08/2022     Endocrinology Follow Up Note    Subjective:    Patient ID: Betty Zamora, female    DOB: August 26, 1955, PCP Sharilyn Sites, MD.   Past Medical History:  Diagnosis Date   Hypertension     Past Surgical History:  Procedure Laterality Date   ABDOMINAL HYSTERECTOMY      Social History   Socioeconomic History   Marital status: Single    Spouse name: Not on file   Number of children: Not on file   Years of education: Not on file   Highest education level: Not on file  Occupational History   Not on file  Tobacco Use   Smoking status: Former    Packs/day: 0.50    Types: Cigarettes   Smokeless tobacco: Never  Vaping Use   Vaping Use: Never used  Substance and Sexual Activity   Alcohol use: Yes    Comment: occasionally   Drug use: No   Sexual activity: Not on file  Other Topics Concern   Not on file  Social History Narrative   Not on file   Social Determinants of Health   Financial Resource Strain: Not on file  Food Insecurity: Not on file  Transportation Needs: Not on file  Physical Activity: Not on file  Stress: Not on file  Social Connections: Not on file    History reviewed. No pertinent family history.  Outpatient Encounter Medications as of 04/08/2022  Medication Sig   amLODipine (NORVASC) 10 MG tablet Take 10 mg by mouth daily.   Vitamin D, Ergocalciferol, (DRISDOL) 1.25 MG (50000 UNIT) CAPS capsule Take 1 capsule (50,000 Units total) by mouth every 7 (seven) days.   No facility-administered encounter medications on file as of 04/08/2022.    ALLERGIES: No Known Allergies  VACCINATION STATUS: Immunization History  Administered Date(s) Administered   Tdap 01/14/2012     HPI  Betty Zamora is 66 y.o. female who presents today with a medical history as above. she is being seen in follow up after being seen in consultation for hyperthyroidism requested by Sharilyn Sites, MD.  She was seen in this clinic by Dr. Dorris Fetch  in 2018 for subclinical hyperthyroidism.  Her initial work up included uptake and scan which was normal.  She notes she had a nurse at her work recommend she get her PCP to check her calcium levels as it was high during the office labs which prompted her to be seen by her PCP.  She denies any symptoms of hyperthyroidism.  She does have a chronic resting tremor.  her most recent thyroid labs revealed mildly suppressed TSH of 0.42 on 02/07/22.  No corresponding Free T4 level or Free T3 levels drawn at that time.  As for her calcium levels, she reports she does not take any calcium supplements, MVIs, eats green leafy veggies and dairy products in average amounts.  She denies any personal or family history of kidney stones, parathyroid problems, osteoporosis.  No history of fragility fractures or falls.  She has no history of CKD or vitamin D deficiency.  She has had a bone density exam with her employer and reports it was normal (no records available to review).  she denies dysphagia, choking, shortness of breath, no recent voice change.    she denies family history of thyroid dysfunction and denies family hx of thyroid cancer. she denies personal history of goiter. she is not on any anti-thyroid medications nor on any thyroid hormone supplements (  thinks she may have been on a pill for her thyroid in the distant past but cannot recall the details). Denies use of Biotin containing supplements.  she is willing to proceed with appropriate work up and therapy for thyrotoxicosis.   Review of systems  Constitutional: + Minimally fluctuating body weight, current Body mass index is 24.82 kg/m., no fatigue, no subjective hyperthermia, no subjective hypothermia Eyes: no blurry vision, no xerophthalmia ENT: no sore throat, no nodules palpated in throat, no dysphagia/odynophagia, no hoarseness Cardiovascular: no chest pain, no shortness of breath, no palpitations, no leg swelling Respiratory: no cough, no shortness  of breath Gastrointestinal: no nausea/vomiting/diarrhea Musculoskeletal: no muscle/joint aches Skin: no rashes, no hyperemia Neurological: + chronic hand tremors, no numbness, no tingling, no dizziness Psychiatric: no depression, no anxiety   Objective:    BP 118/72 (BP Location: Left Arm, Patient Position: Sitting, Cuff Size: Normal)   Pulse 89   Ht _0  (1.626 m)   Wt 144 lb 9.6 oz (65.6 kg)   BMI 24.82 kg/m   Wt Readings from Last 3 Encounters:  04/08/22 144 lb 9.6 oz (65.6 kg)  03/05/22 147 lb 9.6 oz (67 kg)  02/23/17 141 lb (64 kg)     BP Readings from Last 3 Encounters:  04/08/22 118/72  03/05/22 (!) 145/78  02/23/17 (!) 149/88                       Physical Exam- Limited  Constitutional:  Body mass index is 24.82 kg/m. , not in acute distress, mildly anxious state of mind Eyes:  EOMI, no exophthalmos Neck: Supple Thyroid: No gross goiter, no palpable nodularity Cardiovascular: RRR, no murmurs, rubs, or gallops, no edema Respiratory: Adequate breathing efforts, no crackles, rales, rhonchi, or wheezing Musculoskeletal: no gross deformities, strength intact in all four extremities, no gross restriction of joint movements Skin:  no rashes, no hyperemia Neurological: ++ tremor with outstretched hands, DTR normal in BLE   CMP     Component Value Date/Time   NA 143 03/05/2022 0859   K 3.7 03/05/2022 0859   CL 104 03/05/2022 0859   CO2 22 03/05/2022 0859   GLUCOSE 116 (H) 03/05/2022 0859   GLUCOSE 111 (H) 11/02/2010 0916   BUN 11 03/05/2022 0859   CREATININE 0.87 03/05/2022 0859   CALCIUM 10.6 (H) 03/05/2022 0859   PROT 7.1 03/05/2022 0859   ALBUMIN 4.8 03/05/2022 0859   AST 29 03/05/2022 0859   ALT 26 03/05/2022 0859   ALKPHOS 88 03/05/2022 0859   BILITOT 0.8 03/05/2022 0859   GFRNONAA >60 11/02/2010 0916   GFRAA >60 11/02/2010 0916     CBC    Component Value Date/Time   WBC 7.1 11/02/2010 0916   RBC 4.25 11/02/2010 0916   HGB 13.5 11/02/2010  0916   HCT 38.8 11/02/2010 0916   PLT 272 11/02/2010 0916   MCV 91.3 11/02/2010 0916   MCH 31.8 11/02/2010 0916   MCHC 34.8 11/02/2010 0916   RDW 12.6 11/02/2010 0916   LYMPHSABS 2.7 11/02/2010 0916   MONOABS 0.5 11/02/2010 0916   EOSABS 0.1 11/02/2010 0916   BASOSABS 0.0 11/02/2010 0916     Diabetic Labs (most recent): No results found for: "HGBA1C", "MICROALBUR"  Lipid Panel  No results found for: "CHOL", "TRIG", "HDL", "CHOLHDL", "VLDL", "LDLCALC", "LDLDIRECT", "LABVLDL"   Lab Results  Component Value Date   TSH 0.473 03/05/2022   TSH 0.42 02/07/2022   TSH 0.19 (A) 01/11/2017   TSH  0.25 (A) 08/10/2016   TSH 0.27 (A) 07/11/2016   TSH 0.207 (L) 10/24/2010   FREET4 1.51 03/05/2022   FREET4 1.16 10/24/2010     Uptake and Scan from 02/18/2017 CLINICAL DATA:  Subclinical hyperthyroidism   EXAM: THYROID SCAN AND UPTAKE - 24 HOURS   TECHNIQUE: Following the per oral administration of I-123 sodium iodide, the patient returned at 24 hours and uptake measurements were acquired with the uptake probe centered on the neck. Thyroid imaging was also performed.   RADIOPHARMACEUTICALS:  384 microCuries I-123 sodium iodide orally   COMPARISON:  None   FINDINGS: 24 hour radio iodine uptake is calculated at 29%, within the normal range.   Images of the thyroid gland in 3 projections are normal.   No focal areas of increased or decreased tracer localization seen.   IMPRESSION: Normal 24 hour uptake of 29%.   Normal thyroid scan.     Electronically Signed   By: Lavonia Dana M.D.   On: 02/18/2017 14:37   Thyroid US from 03/17/22  CLINICAL DATA:  Other.  Abnormal TSH.   EXAM: THYROID ULTRASOUND   TECHNIQUE: Ultrasound examination of the thyroid gland and adjacent soft tissues was performed.   COMPARISON:  None Available.   FINDINGS: Parenchymal Echotexture: Mildly heterogenous - no evidence of glandular hyperemia.   Isthmus: Normal in size measures 0.2 cm  in diameter   Right lobe: Normal in size measuring 4.4 x 1.9 x 1.3 cm   Left lobe: Normal in size measuring 4.6 x 1.9 x 1.1 cm   _________________________________________________________   Estimated total number of nodules >/= 1 cm: 1   Number of spongiform nodules >/=  2 cm not described below (TR1): 0   Number of mixed cystic and solid nodules >/= 1.5 cm not described below (TR2): 0   _________________________________________________________   There are scattered punctate (sub 5 mm) anechoic cysts scattered within the right lobe of the thyroid, none of which meet imaging criteria to recommend percutaneous sampling or continued dedicated follow-up   _________________________________________________________   There is an approximately 1.3 x 1.1 x 0.5 cm anechoic cyst within the superior pole of the left lobe of the thyroid (labeled 1), which does not meet criteria to recommend percutaneous sampling or continued dedicated follow-up.   There is an approximately 0.6 x 0.4 x 0.5 cm minimally complex cyst within the superior pole of the left lobe of the thyroid (labeled 2), which does not meet criteria to recommend percutaneous sampling or continued dedicated follow-up   There is an approximately 0.6 x 0.6 x 0.4 cm isoechoic ill-defined nodule/pseudonodule within mid aspect of the left lobe of the thyroid (labeled 3), which does not meet criteria to recommend percutaneous sampling or continued dedicated follow-up.   IMPRESSION: 1. Mildly heterogeneous but normal-sized thyroid without worrisome nodule or mass. 2. None of the discretely measured thyroid nodules/cysts meet imaging criteria to recommend percutaneous sampling or continued dedicated follow-up.   The above is in keeping with the ACR TI-RADS recommendations - J Am Coll Radiol 2017;14:587-595.     Electronically Signed   By: Sandi Mariscal M.D.   On: 03/17/2022 11:31    Latest Reference Range & Units 03/05/22  08:59  COMPREHENSIVE METABOLIC PANEL  Rpt !  Sodium 134 - 144 mmol/L 143  Potassium 3.5 - 5.2 mmol/L 3.7  Chloride 96 - 106 mmol/L 104  CO2 20 - 29 mmol/L 22  Glucose 70 - 99 mg/dL 116 (H)  BUN 8 - 27 mg/dL  11  Creatinine 0.57 - 1.00 mg/dL 0.87  Calcium 8.7 - 10.3 mg/dL 10.6 (H)  BUN/Creatinine Ratio 12 - 28  13  eGFR >59 mL/min/1.73 74  Phosphorus 3.0 - 4.3 mg/dL 3.0  Magnesium 1.6 - 2.3 mg/dL 1.8  Alkaline Phosphatase 44 - 121 IU/L 88  Albumin 3.9 - 4.9 g/dL 4.8  Albumin/Globulin Ratio 1.2 - 2.2  2.1  AST 0 - 40 IU/L 29  ALT 0 - 32 IU/L 26  Total Protein 6.0 - 8.5 g/dL 7.1  Total Bilirubin 0.0 - 1.2 mg/dL 0.8  Vitamin D, 25-Hydroxy 30.0 - 100.0 ng/mL 12.8 (L)  Globulin, Total 1.5 - 4.5 g/dL 2.3  PTH, Intact 15 - 65 pg/mL 91 (H)  PTH Interp  Comment  TSH 0.450 - 4.500 uIU/mL 0.473  Triiodothyronine,Free,Serum 2.0 - 4.4 pg/mL 3.5  T4,Free(Direct) 0.82 - 1.77 ng/dL 1.51  Thyroperoxidase Ab SerPl-aCnc 0 - 34 IU/mL 14  Thyroglobulin Antibody 0.0 - 0.9 IU/mL <1.0  !: Data is abnormal (H): Data is abnormally high (L): Data is abnormally low Rpt: View report in Results Review for more information  Assessment & Plan:   1. Abnormal TSH 2. Subclinical hyperthyroidism-resolved  she is being seen at a kind request of Sharilyn Sites, MD.  Her repeat thyroid function tests show normal thyroid function.  Her antibody testing was negative, ruling out autoimmune thyroid dysfunction.   Her thyroid ultrasound showed mild heterogeneous tissue without discrete nodularity.   3. Hypercalcemia Her repeat calcium levels had improved.  Her vitiamin D was significantly low which may be contributing to her calcium levels.  PTH was also elevated.    I did start her on Ergocalciferol 50000 units weekly x 24 weeks.   Will recheck blood work in 8 weeks for surveillance and will also add 24 hr urine to rule out [place].       -Patient is advised to maintain close follow up with Sharilyn Sites, MD  for primary care needs.    I spent 23 minutes in the care of the patient today including review of labs from Thyroid Function, CMP, and other relevant labs ; imaging/biopsy records (current and previous including abstractions from other facilities); face-to-face time discussing  her lab results and symptoms, medications doses, her options of short and long term treatment based on the latest standards of care / guidelines;   and documenting the encounter.  Betty Zamora  participated in the discussions, expressed understanding, and voiced agreement with the above plans.  All questions were answered to her satisfaction. she is encouraged to contact clinic should she have any questions or concerns prior to her return visit.  Follow up plan: Return in about 8 weeks (around 06/03/2022) for hypercalcemia follow up.   Thank you for involving me in the care of this pleasant patient, and I will continue to update you with her progress.   Rayetta Pigg, [facility] [address] [place], [place] [address] Phone: [phone] Fax: [phone]  04/08/2022, 8:38 AM

## 2022-06-03 ENCOUNTER — Ambulatory Visit: Payer: BC Managed Care – PPO | Admitting: Nurse Practitioner

## 2022-06-06 DIAGNOSIS — Z6824 Body mass index (BMI) 24.0-24.9, adult: Secondary | ICD-10-CM | POA: Diagnosis not present

## 2022-06-06 DIAGNOSIS — K5900 Constipation, unspecified: Secondary | ICD-10-CM | POA: Diagnosis not present

## 2022-06-06 DIAGNOSIS — R1030 Lower abdominal pain, unspecified: Secondary | ICD-10-CM | POA: Diagnosis not present

## 2022-06-06 DIAGNOSIS — R030 Elevated blood-pressure reading, without diagnosis of hypertension: Secondary | ICD-10-CM | POA: Diagnosis not present

## 2023-02-10 DIAGNOSIS — I10 Essential (primary) hypertension: Secondary | ICD-10-CM | POA: Diagnosis not present

## 2023-02-10 DIAGNOSIS — Z23 Encounter for immunization: Secondary | ICD-10-CM | POA: Diagnosis not present

## 2023-02-10 DIAGNOSIS — R7309 Other abnormal glucose: Secondary | ICD-10-CM | POA: Diagnosis not present

## 2023-02-10 DIAGNOSIS — Z1331 Encounter for screening for depression: Secondary | ICD-10-CM | POA: Diagnosis not present

## 2023-02-10 DIAGNOSIS — Z6824 Body mass index (BMI) 24.0-24.9, adult: Secondary | ICD-10-CM | POA: Diagnosis not present

## 2023-02-10 DIAGNOSIS — E785 Hyperlipidemia, unspecified: Secondary | ICD-10-CM | POA: Diagnosis not present

## 2023-02-10 DIAGNOSIS — Z0000 Encounter for general adult medical examination without abnormal findings: Secondary | ICD-10-CM | POA: Diagnosis not present

## 2023-02-10 DIAGNOSIS — F17200 Nicotine dependence, unspecified, uncomplicated: Secondary | ICD-10-CM | POA: Diagnosis not present

## 2023-02-25 ENCOUNTER — Other Ambulatory Visit: Payer: Self-pay | Admitting: Nurse Practitioner

## 2023-03-29 DIAGNOSIS — L720 Epidermal cyst: Secondary | ICD-10-CM | POA: Diagnosis not present

## 2023-05-24 DIAGNOSIS — I10 Essential (primary) hypertension: Secondary | ICD-10-CM | POA: Diagnosis not present

## 2023-05-24 DIAGNOSIS — R600 Localized edema: Secondary | ICD-10-CM | POA: Diagnosis not present

## 2023-05-24 DIAGNOSIS — Z6824 Body mass index (BMI) 24.0-24.9, adult: Secondary | ICD-10-CM | POA: Diagnosis not present

## 2023-05-24 DIAGNOSIS — I872 Venous insufficiency (chronic) (peripheral): Secondary | ICD-10-CM | POA: Diagnosis not present

## 2023-05-24 DIAGNOSIS — M5416 Radiculopathy, lumbar region: Secondary | ICD-10-CM | POA: Diagnosis not present

## 2023-07-14 ENCOUNTER — Other Ambulatory Visit (HOSPITAL_COMMUNITY): Payer: Self-pay | Admitting: Family Medicine

## 2023-07-14 DIAGNOSIS — T461X5A Adverse effect of calcium-channel blockers, initial encounter: Secondary | ICD-10-CM | POA: Diagnosis not present

## 2023-07-14 DIAGNOSIS — E1159 Type 2 diabetes mellitus with other circulatory complications: Secondary | ICD-10-CM | POA: Diagnosis not present

## 2023-07-14 DIAGNOSIS — R600 Localized edema: Secondary | ICD-10-CM | POA: Diagnosis not present

## 2023-07-14 DIAGNOSIS — Z6825 Body mass index (BMI) 25.0-25.9, adult: Secondary | ICD-10-CM | POA: Diagnosis not present

## 2023-07-14 DIAGNOSIS — I872 Venous insufficiency (chronic) (peripheral): Secondary | ICD-10-CM

## 2023-07-14 DIAGNOSIS — E663 Overweight: Secondary | ICD-10-CM | POA: Diagnosis not present

## 2023-07-14 DIAGNOSIS — I10 Essential (primary) hypertension: Secondary | ICD-10-CM | POA: Diagnosis not present

## 2023-07-19 ENCOUNTER — Ambulatory Visit (HOSPITAL_COMMUNITY)
Admission: RE | Admit: 2023-07-19 | Discharge: 2023-07-19 | Disposition: A | Discharge status: Home / Self Care | Source: Ambulatory Visit | Attending: Family Medicine | Admitting: Family Medicine

## 2023-07-19 DIAGNOSIS — R600 Localized edema: Secondary | ICD-10-CM | POA: Insufficient documentation

## 2023-07-19 DIAGNOSIS — I2699 Other pulmonary embolism without acute cor pulmonale: Secondary | ICD-10-CM | POA: Diagnosis not present

## 2023-07-19 DIAGNOSIS — R0602 Shortness of breath: Secondary | ICD-10-CM | POA: Diagnosis not present

## 2023-07-19 DIAGNOSIS — E559 Vitamin D deficiency, unspecified: Secondary | ICD-10-CM | POA: Diagnosis not present

## 2023-07-19 DIAGNOSIS — Z6824 Body mass index (BMI) 24.0-24.9, adult: Secondary | ICD-10-CM | POA: Diagnosis not present

## 2023-07-19 DIAGNOSIS — I872 Venous insufficiency (chronic) (peripheral): Secondary | ICD-10-CM | POA: Insufficient documentation

## 2023-07-19 DIAGNOSIS — I82401 Acute embolism and thrombosis of unspecified deep veins of right lower extremity: Secondary | ICD-10-CM | POA: Diagnosis not present

## 2023-07-19 DIAGNOSIS — Z79899 Other long term (current) drug therapy: Secondary | ICD-10-CM | POA: Diagnosis not present

## 2023-07-19 DIAGNOSIS — J90 Pleural effusion, not elsewhere classified: Secondary | ICD-10-CM | POA: Diagnosis not present

## 2023-07-19 DIAGNOSIS — I82431 Acute embolism and thrombosis of right popliteal vein: Secondary | ICD-10-CM | POA: Diagnosis not present

## 2023-07-19 DIAGNOSIS — R918 Other nonspecific abnormal finding of lung field: Secondary | ICD-10-CM | POA: Diagnosis not present

## 2023-07-19 DIAGNOSIS — I219 Acute myocardial infarction, unspecified: Secondary | ICD-10-CM | POA: Diagnosis not present

## 2023-07-19 DIAGNOSIS — N1832 Chronic kidney disease, stage 3b: Secondary | ICD-10-CM | POA: Diagnosis not present

## 2023-07-19 DIAGNOSIS — E876 Hypokalemia: Secondary | ICD-10-CM | POA: Diagnosis not present

## 2023-07-19 DIAGNOSIS — R0789 Other chest pain: Secondary | ICD-10-CM | POA: Diagnosis not present

## 2023-07-19 DIAGNOSIS — I129 Hypertensive chronic kidney disease with stage 1 through stage 4 chronic kidney disease, or unspecified chronic kidney disease: Secondary | ICD-10-CM | POA: Diagnosis not present

## 2023-07-19 DIAGNOSIS — R9389 Abnormal findings on diagnostic imaging of other specified body structures: Secondary | ICD-10-CM | POA: Diagnosis not present

## 2023-07-19 DIAGNOSIS — Z86718 Personal history of other venous thrombosis and embolism: Secondary | ICD-10-CM | POA: Diagnosis not present

## 2023-07-19 DIAGNOSIS — E869 Volume depletion, unspecified: Secondary | ICD-10-CM | POA: Diagnosis not present

## 2023-07-19 DIAGNOSIS — E1159 Type 2 diabetes mellitus with other circulatory complications: Secondary | ICD-10-CM | POA: Diagnosis not present

## 2023-07-19 DIAGNOSIS — R609 Edema, unspecified: Secondary | ICD-10-CM | POA: Diagnosis not present

## 2023-07-19 DIAGNOSIS — M7989 Other specified soft tissue disorders: Secondary | ICD-10-CM | POA: Diagnosis not present

## 2023-07-19 DIAGNOSIS — I10 Essential (primary) hypertension: Secondary | ICD-10-CM | POA: Diagnosis not present

## 2023-07-19 DIAGNOSIS — Z90710 Acquired absence of both cervix and uterus: Secondary | ICD-10-CM | POA: Diagnosis not present

## 2023-07-19 DIAGNOSIS — I2609 Other pulmonary embolism with acute cor pulmonale: Secondary | ICD-10-CM | POA: Diagnosis not present

## 2023-07-19 DIAGNOSIS — I82441 Acute embolism and thrombosis of right tibial vein: Secondary | ICD-10-CM | POA: Diagnosis not present

## 2023-07-19 DIAGNOSIS — K219 Gastro-esophageal reflux disease without esophagitis: Secondary | ICD-10-CM | POA: Diagnosis not present

## 2023-07-19 DIAGNOSIS — R079 Chest pain, unspecified: Secondary | ICD-10-CM | POA: Diagnosis not present

## 2023-07-19 DIAGNOSIS — I82811 Embolism and thrombosis of superficial veins of right lower extremities: Secondary | ICD-10-CM | POA: Diagnosis not present

## 2023-07-22 ENCOUNTER — Other Ambulatory Visit: Payer: Self-pay

## 2023-07-22 ENCOUNTER — Emergency Department (HOSPITAL_COMMUNITY)

## 2023-07-22 ENCOUNTER — Encounter (HOSPITAL_COMMUNITY): Payer: Self-pay | Admitting: Emergency Medicine

## 2023-07-22 ENCOUNTER — Inpatient Hospital Stay (HOSPITAL_COMMUNITY)
Admission: EM | Admit: 2023-07-22 | Discharge: 2023-07-27 | DRG: 176 | Disposition: A | Discharge status: Home / Self Care | Attending: Internal Medicine | Admitting: Internal Medicine

## 2023-07-22 DIAGNOSIS — Z79899 Other long term (current) drug therapy: Secondary | ICD-10-CM | POA: Diagnosis not present

## 2023-07-22 DIAGNOSIS — E876 Hypokalemia: Secondary | ICD-10-CM | POA: Diagnosis present

## 2023-07-22 DIAGNOSIS — Z86718 Personal history of other venous thrombosis and embolism: Secondary | ICD-10-CM | POA: Diagnosis not present

## 2023-07-22 DIAGNOSIS — I872 Venous insufficiency (chronic) (peripheral): Secondary | ICD-10-CM | POA: Diagnosis not present

## 2023-07-22 DIAGNOSIS — I2609 Other pulmonary embolism with acute cor pulmonale: Secondary | ICD-10-CM | POA: Diagnosis not present

## 2023-07-22 DIAGNOSIS — I2699 Other pulmonary embolism without acute cor pulmonale: Secondary | ICD-10-CM | POA: Diagnosis present

## 2023-07-22 DIAGNOSIS — I129 Hypertensive chronic kidney disease with stage 1 through stage 4 chronic kidney disease, or unspecified chronic kidney disease: Secondary | ICD-10-CM | POA: Diagnosis present

## 2023-07-22 DIAGNOSIS — K219 Gastro-esophageal reflux disease without esophagitis: Secondary | ICD-10-CM | POA: Diagnosis present

## 2023-07-22 DIAGNOSIS — E559 Vitamin D deficiency, unspecified: Secondary | ICD-10-CM | POA: Diagnosis present

## 2023-07-22 DIAGNOSIS — R600 Localized edema: Secondary | ICD-10-CM | POA: Diagnosis not present

## 2023-07-22 DIAGNOSIS — Z90710 Acquired absence of both cervix and uterus: Secondary | ICD-10-CM | POA: Diagnosis not present

## 2023-07-22 DIAGNOSIS — I82431 Acute embolism and thrombosis of right popliteal vein: Secondary | ICD-10-CM | POA: Diagnosis present

## 2023-07-22 DIAGNOSIS — I219 Acute myocardial infarction, unspecified: Secondary | ICD-10-CM | POA: Diagnosis not present

## 2023-07-22 DIAGNOSIS — I10 Essential (primary) hypertension: Secondary | ICD-10-CM | POA: Diagnosis not present

## 2023-07-22 DIAGNOSIS — I82441 Acute embolism and thrombosis of right tibial vein: Secondary | ICD-10-CM | POA: Diagnosis present

## 2023-07-22 DIAGNOSIS — Z6824 Body mass index (BMI) 24.0-24.9, adult: Secondary | ICD-10-CM | POA: Diagnosis not present

## 2023-07-22 DIAGNOSIS — E1159 Type 2 diabetes mellitus with other circulatory complications: Secondary | ICD-10-CM | POA: Diagnosis not present

## 2023-07-22 DIAGNOSIS — I82401 Acute embolism and thrombosis of unspecified deep veins of right lower extremity: Secondary | ICD-10-CM | POA: Diagnosis not present

## 2023-07-22 DIAGNOSIS — N1832 Chronic kidney disease, stage 3b: Secondary | ICD-10-CM | POA: Diagnosis present

## 2023-07-22 LAB — CBC
HCT: 47.7 % — ABNORMAL HIGH (ref 36.0–46.0)
Hemoglobin: 15.9 g/dL — ABNORMAL HIGH (ref 12.0–15.0)
MCH: 32.8 pg (ref 26.0–34.0)
MCHC: 33.3 g/dL (ref 30.0–36.0)
MCV: 98.4 fL (ref 80.0–100.0)
Platelets: 246 10*3/uL (ref 150–400)
RBC: 4.85 MIL/uL (ref 3.87–5.11)
RDW: 14.9 % (ref 11.5–15.5)
WBC: 8.6 10*3/uL (ref 4.0–10.5)
nRBC: 0 % (ref 0.0–0.2)

## 2023-07-22 LAB — BASIC METABOLIC PANEL
Anion gap: 10 (ref 5–15)
BUN: 31 mg/dL — ABNORMAL HIGH (ref 8–23)
CO2: 24 mmol/L (ref 22–32)
Calcium: 8.3 mg/dL — ABNORMAL LOW (ref 8.9–10.3)
Chloride: 109 mmol/L (ref 98–111)
Creatinine, Ser: 1.61 mg/dL — ABNORMAL HIGH (ref 0.44–1.00)
GFR, Estimated: 35 mL/min — ABNORMAL LOW (ref >60)
Glucose, Bld: 100 mg/dL — ABNORMAL HIGH (ref 70–99)
Potassium: 3.9 mmol/L (ref 3.5–5.1)
Sodium: 143 mmol/L (ref 135–145)

## 2023-07-22 LAB — TROPONIN I (HIGH SENSITIVITY)
Troponin I (High Sensitivity): 136 ng/L (ref <18)
Troponin I (High Sensitivity): 120 ng/L (ref <18)

## 2023-07-22 LAB — MAGNESIUM: Magnesium: 2.1 mg/dL (ref 1.7–2.4)

## 2023-07-22 LAB — D-DIMER, QUANTITATIVE: D-Dimer, Quant: 3.1 ug{FEU}/mL — ABNORMAL HIGH (ref 0.00–0.50)

## 2023-07-22 LAB — HEPARIN LEVEL (UNFRACTIONATED): Heparin Unfractionated: 0.3 [IU]/mL (ref 0.30–0.70)

## 2023-07-22 LAB — BRAIN NATRIURETIC PEPTIDE: B Natriuretic Peptide: 210 pg/mL — ABNORMAL HIGH (ref 0.0–100.0)

## 2023-07-22 LAB — TSH: TSH: 1.629 u[IU]/mL (ref 0.350–4.500)

## 2023-07-22 MED ORDER — ONDANSETRON HCL 4 MG PO TABS: 4.0000 mg | ORAL_TABLET | Freq: Four times a day (QID) | ORAL | Status: DC | PRN

## 2023-07-22 MED ORDER — ASPIRIN 81 MG PO CHEW
324.0000 mg | CHEWABLE_TABLET | Freq: Once | ORAL | Status: AC
  Administered 2023-07-22: 324 mg via ORAL
  Filled 2023-07-22: qty 4

## 2023-07-22 MED ORDER — PANTOPRAZOLE SODIUM 40 MG PO TBEC
40.0000 mg | DELAYED_RELEASE_TABLET | Freq: Every day | ORAL | Status: DC
  Administered 2023-07-22 – 2023-07-27 (×6): 40 mg via ORAL
  Filled 2023-07-22 (×6): qty 1

## 2023-07-22 MED ORDER — ONDANSETRON HCL 4 MG/2ML IJ SOLN: 4.0000 mg | Freq: Four times a day (QID) | INTRAMUSCULAR | Status: DC | PRN

## 2023-07-22 MED ORDER — HEPARIN (PORCINE) 25000 UT/250ML-% IV SOLN
1350.0000 [IU]/h | INTRAVENOUS | Status: DC
  Administered 2023-07-22: 1100 [IU]/h via INTRAVENOUS
  Administered 2023-07-23 – 2023-07-24 (×2): 1350 [IU]/h via INTRAVENOUS
  Filled 2023-07-22 (×3): qty 250

## 2023-07-22 MED ORDER — ACETAMINOPHEN 325 MG PO TABS
650.0000 mg | ORAL_TABLET | Freq: Four times a day (QID) | ORAL | Status: DC | PRN
  Administered 2023-07-27: 650 mg via ORAL
  Filled 2023-07-22: qty 2

## 2023-07-22 MED ORDER — HYDROMORPHONE HCL 1 MG/ML IJ SOLN
1.0000 mg | INTRAMUSCULAR | Status: DC | PRN
  Administered 2023-07-23 – 2023-07-26 (×10): 1 mg via INTRAVENOUS
  Filled 2023-07-22 (×10): qty 1

## 2023-07-22 MED ORDER — OXYCODONE HCL 5 MG PO TABS
5.0000 mg | ORAL_TABLET | Freq: Four times a day (QID) | ORAL | Status: DC | PRN
  Administered 2023-07-25 – 2023-07-26 (×4): 5 mg via ORAL
  Filled 2023-07-22 (×4): qty 1

## 2023-07-22 MED ORDER — HYDRALAZINE HCL 20 MG/ML IJ SOLN: 10.0000 mg | Freq: Three times a day (TID) | INTRAMUSCULAR | Status: DC | PRN

## 2023-07-22 MED ORDER — HEPARIN BOLUS VIA INFUSION
4000.0000 [IU] | Freq: Once | INTRAVENOUS | Status: AC
  Administered 2023-07-22: 4000 [IU] via INTRAVENOUS

## 2023-07-22 MED ORDER — AMLODIPINE BESYLATE 10 MG PO TABS
10.0000 mg | ORAL_TABLET | Freq: Every day | ORAL | Status: DC
  Administered 2023-07-22 – 2023-07-27 (×6): 10 mg via ORAL
  Filled 2023-07-22: qty 2
  Filled 2023-07-22 (×5): qty 1

## 2023-07-22 MED ORDER — IOHEXOL 350 MG/ML SOLN
60.0000 mL | Freq: Once | INTRAVENOUS | Status: AC | PRN
  Administered 2023-07-22: 60 mL via INTRAVENOUS

## 2023-07-22 MED ORDER — SODIUM CHLORIDE 0.9 % IV BOLUS
500.0000 mL | Freq: Once | INTRAVENOUS | Status: AC
  Administered 2023-07-22: 500 mL via INTRAVENOUS

## 2023-07-22 MED ORDER — ACETAMINOPHEN 650 MG RE SUPP: 650.0000 mg | Freq: Four times a day (QID) | RECTAL | Status: DC | PRN

## 2023-07-22 NOTE — Progress Notes (Signed)
 PHARMACY - ANTICOAGULATION CONSULT NOTE  Pharmacy Consult for Heparin Indication: pulmonary embolus  No Known Allergies  Patient Measurements: Height: 5\' 4"  (162.6 cm) Weight: 69.9 kg (154 lb) IBW/kg (Calculated) : 54.7 HEPARIN DW (KG): 68.8   Vital Signs: Temp: 98.2 F (36.8 C) (03/20 0911) Temp Source: Oral (03/20 0911) BP: 144/97 (03/20 1000) Pulse Rate: 96 (03/20 1000)  Labs: Recent Labs    07/22/23 0933 07/22/23 1112  HGB 15.9*  --   HCT 47.7*  --   PLT 246  --   CREATININE 1.61*  --   TROPONINIHS 136* 120*    Estimated Creatinine Clearance: 32.5 mL/min (A) (by C-G formula based on SCr of 1.61 mg/dL (H)).   Medical History: Past Medical History:  Diagnosis Date   Hypertension     Medications:  See med rec  Assessment: 68 y.o. female.  She is brought in by ambulance from her PCPs office after she presented there with 2 days of chest pain and shortness of breath. CTA shows s moderate to large volume acute pulmonary embolism with CT evidence of right heart strain. Patient not on oral anticoagulants PTA. Pharmacy asked to start Heparin.   Goal of Therapy:  Heparin level 0.3-0.7 units/ml Monitor platelets by anticoagulation protocol: Yes   Plan:  Give 4000 units bolus x 1 Start heparin infusion at 1100 units/hr Check anti-Xa level in ~6-8 hours and daily while on heparin Continue to monitor H&H and platelets  Elder Cyphers, BS Pharm D, BCPS Clinical Pharmacist 07/22/2023,11:44 AM

## 2023-07-22 NOTE — ED Triage Notes (Signed)
 Pt bib rcems w/ c/o positional chest pain X2 days. Pt states CP has moved around in her chest and hurts worse with movement and breathing. She stated breathing deep gets "cut off like a hiccup." Pt reports some swelling in her BLE

## 2023-07-22 NOTE — H&P (Signed)
 History and Physical    Patient: Betty Zamora GMW:[id number] DOB: 05-29-1955 DOA: 07/22/2023 DOS: the patient was seen and examined on 07/22/2023 PCP: Assunta Found, MD  Patient coming from: Home  Chief Complaint:  Chief Complaint  Patient presents with   Chest Pain   HPI: Betty Zamora is a 68 y.o. female with medical history significant of hypertension, vitamin D deficiency, GERD and chronic kidney disease; who presented to the hospital secondary to chest pain.  Patient reports approximately 2-3 days of pleuritic chest discomfort for with associated shortness of breath with activity.  Patient reports worsening pain with deep inspiration, movements and any coughing spell.    On further questioning she has noticed increased leg swelling bilaterally for over weeks now.  Patient expressed no dysuria, no hematuria, no melena, no hematochezia, no focal weaknesses, no hemoptysis, headaches, blurred vision, fever/chills or any sick contacts.  Workup in the ED demonstrating mildly elevated troponin, elevated D-dimer and CT angiogram with large pulmonary embolism and right heart strain.  EKG normal, without acute ischemic changes.  Case discussed with PCCM (Dr. Vassie Loll) with recommendations to treat with IV heparin and transferred to [facility] in case she required the need for thrombolysis therapy. TRH contacted to place patient in the hospital for further evaluation and management.   Review of Systems: As mentioned in the history of present illness. All other systems reviewed and are negative. Past Medical History:  Diagnosis Date   Hypertension    Past Surgical History:  Procedure Laterality Date   ABDOMINAL HYSTERECTOMY     Social History:  reports that she has quit smoking. Her smoking use included cigarettes. She has never used smokeless tobacco. She reports current alcohol use. She reports that she does not use drugs.  No Known Allergies  History reviewed. No  pertinent family history.  Prior to Admission medications   Medication Sig Start Date End Date Taking? Authorizing Provider  amLODipine (NORVASC) 10 MG tablet Take 10 mg by mouth daily.    [provider]  Vitamin D, Ergocalciferol, (DRISDOL) 1.25 MG (50000 UNIT) CAPS capsule Take 1 capsule (50,000 Units total) by mouth every 7 (seven) days. 04/08/22   Dani Gobble, NP    Physical Exam: Vitals:   07/22/23 1000 07/22/23 1245 07/22/23 1300 07/22/23 1315  BP: (!) 144/97 (!) 175/99 (!) 160/100 (!) 151/95  Pulse: 96 94 92 90  Resp: 18 (!) 31 (!) 31 (!) 30  Temp:      TempSrc:      SpO2: 96% 93% 92% 94%  Weight:      Height:       General exam: Alert, awake, oriented x 3; no nausea, no vomiting, no shortness of breath while resting. Respiratory system: Positive tachypnea with minimal exertion appreciated; no wheezing, no crackles, no using accessory muscle.  Good saturation on room air. Cardiovascular system:RRR.  No rubs, no gallops, no JVD on exam. Gastrointestinal system: Abdomen is nondistended, soft and nontender. No organomegaly or masses felt. Normal bowel sounds heard. Central nervous system:  No focal neurological deficits. Extremities: No cyanosis or clubbing; 1-2+ edema appreciated bilaterally. Skin: No petechiae. Psychiatry: Judgement and insight appear normal. Mood & affect appropriate.   Data Reviewed: Basic metabolic panel: Sodium 143, potassium 3.9, chloride 109, bicarb 24, BUN 31, creatinine 1.6 and GFR 35 CBC: White blood cells 8.6, hemoglobin 15.9 and platelet count 2 46K Troponin: 136 >> 120 D-dimer: 3.10 BNP: 210  Assessment and Plan: 1-acute pulmonary embolism -Patient  started on IV heparin -No oxygen requirement currently appreciated -Stable blood pressure -2D echo and lower extremity Dopplers has been ordered -Close monitoring recommended given findings of large pulmonary embolism and right heart strain on CT. -Continue as needed  analgesics. -PCCM available for consultation in case of decompensation as she will need thrombolysis therapy.  2-elevated blood pressure -Resume home antihypertensive agents -Closely follow vital signs -As needed hydralazine ordered.  3-chronic kidney disease stage IIIb -Most recent renal function for over a year ago -Patient denies any urinary retention or complaints of any urinary symptoms at the moment -Electrolytes are stable -Closely follow renal function trend -Maintain adequate hydration -Minimize nephrotoxic agents -If needed will order renal ultrasound. -Outpatient follow-up with nephrology service recommended.  4-GERD -Continue PPI.  5-history of vitamin D deficiency -Resume weekly high-dose vitamin D supplementation at discharge.  6-elevated troponins -Appears to be secondary to pulmonary embolism -EKG without acute ischemic changes -Follow 2D echo result -Continue treatment as mentioned above with heparin drip.     Advance Care Planning:   Code Status: Full Code   Consults: PCCM consulted by EDP; recommending transfer to [facility] in case thrombolysis therapy needed.  Service will need to be reconsulted once at [facility] based on clinical response.  Family Communication: Sister at bedside  Severity of Illness: The appropriate patient status for this patient is INPATIENT. Inpatient status is judged to be reasonable and necessary in order to provide the required intensity of service to ensure the patient's safety. The patient's presenting symptoms, physical exam findings, and initial radiographic and laboratory data in the context of their chronic comorbidities is felt to place them at high risk for further clinical deterioration. Furthermore, it is not anticipated that the patient will be medically stable for discharge from the hospital within 2 midnights of admission.   * I certify that at the point of admission it is my clinical judgment that the patient  will require inpatient hospital care spanning beyond 2 midnights from the point of admission due to high intensity of service, high risk for further deterioration and high frequency of surveillance required.*  Author: Vassie Loll, MD 07/22/2023 1:25 PM  For on call review [email or web address].

## 2023-07-22 NOTE — ED Notes (Signed)
 Pt ambulated to the restroom.

## 2023-07-22 NOTE — ED Provider Notes (Signed)
 [place] EMERGENCY DEPARTMENT AT [facility] Provider Note   CSN: [id number] Arrival date & time: 07/22/23  0845     History  Chief Complaint  Patient presents with   Chest Pain    Betty Zamora is a 68 y.o. female.  She is brought in by ambulance from her PCPs office after she presented there with 2 days of chest pain and shortness of breath.  She said the pain initially started on the left side and now is migrated around to the right side.  Associated with shortness of breath dyspnea on exertion.  She denies any cough fever nausea vomiting diaphoresis.  Symptoms worse with climbing a flight of stairs.  She is a smoker.  No history of coronary disease.  Denies any drug use.  The history is provided by the patient.  Chest Pain Pain location:  Substernal area, L chest and R chest Pain quality: aching   Pain severity:  Severe Onset quality:  Gradual Duration:  4 days Timing:  Intermittent Progression:  Unchanged Chronicity:  New Relieved by:  None tried Worsened by:  Exertion and movement Ineffective treatments:  Rest Associated symptoms: dizziness and shortness of breath   Associated symptoms: no abdominal pain, no cough, no diaphoresis, no fever, no nausea and no vomiting   Risk factors: hypertension and smoking        Home Medications Prior to Admission medications   Medication Sig Start Date End Date Taking? Authorizing Provider  amLODipine (NORVASC) 10 MG tablet Take 10 mg by mouth daily.    [provider]  Vitamin D, Ergocalciferol, (DRISDOL) 1.25 MG (50000 UNIT) CAPS capsule Take 1 capsule (50,000 Units total) by mouth every 7 (seven) days. 04/08/22   Dani Gobble, NP      Allergies    Patient has no known allergies.    Review of Systems   Review of Systems  Constitutional:  Negative for diaphoresis and fever.  Respiratory:  Positive for shortness of breath. Negative for cough.   Cardiovascular:  Positive for chest pain.   Gastrointestinal:  Negative for abdominal pain, nausea and vomiting.  Neurological:  Positive for dizziness.    Physical Exam Updated Vital Signs BP (!) 137/119 (BP Location: Left Arm)   Pulse 96   Temp 98.2 F (36.8 C) (Oral)   Resp 16   Ht 5\' 4"  (1.626 m)   Wt 69.9 kg   SpO2 97%   BMI 26.43 kg/m  Physical Exam Vitals and nursing note reviewed.  Constitutional:      General: She is not in acute distress.    Appearance: She is well-developed.  HENT:     Head: Normocephalic and atraumatic.  Eyes:     Conjunctiva/sclera: Conjunctivae normal.  Cardiovascular:     Rate and Rhythm: Normal rate and regular rhythm.     Heart sounds: Normal heart sounds. No murmur heard. Pulmonary:     Effort: Pulmonary effort is normal. No respiratory distress.     Breath sounds: Normal breath sounds.  Abdominal:     Palpations: Abdomen is soft.     Tenderness: There is no abdominal tenderness.  Musculoskeletal:        General: No swelling.     Cervical back: Neck supple.     Right lower leg: No tenderness. No edema.     Left lower leg: No tenderness. No edema.  Skin:    General: Skin is warm and dry.     Capillary Refill: Capillary refill  takes less than 2 seconds.  Neurological:     General: No focal deficit present.     Mental Status: She is alert.     ED Results / Procedures / Treatments   Labs (all labs ordered are listed, but only abnormal results are displayed) Labs Reviewed  BASIC METABOLIC PANEL - Abnormal; Notable for the following components:      Result Value   Glucose, Bld 100 (*)    BUN 31 (*)    Creatinine, Ser 1.61 (*)    Calcium 8.3 (*)    GFR, Estimated 35 (*)    All other components within normal limits  CBC - Abnormal; Notable for the following components:   Hemoglobin 15.9 (*)    HCT 47.7 (*)    All other components within normal limits  BRAIN NATRIURETIC PEPTIDE - Abnormal; Notable for the following components:   B Natriuretic Peptide 210.0 (*)    All  other components within normal limits  D-DIMER, QUANTITATIVE - Abnormal; Notable for the following components:   D-Dimer, Quant 3.10 (*)    All other components within normal limits  TROPONIN I (HIGH SENSITIVITY) - Abnormal; Notable for the following components:   Troponin I (High Sensitivity) 136 (*)    All other components within normal limits  TROPONIN I (HIGH SENSITIVITY) - Abnormal; Notable for the following components:   Troponin I (High Sensitivity) 120 (*)    All other components within normal limits  MAGNESIUM  HEPARIN LEVEL (UNFRACTIONATED)  TSH  HIV ANTIBODY (ROUTINE TESTING W REFLEX)    EKG EKG Interpretation Date/Time:  Thursday July 22 2023 09:32:23 EDT Ventricular Rate:  91 PR Interval:  124 QRS Duration:  86 QT Interval:  381 QTC Calculation: 469 R Axis:   -23  Text Interpretation: Sinus rhythm Inferior infarct, old Probable anterior infarct, age indeterminate No significant change since last tracing today Confirmed by Meridee Score [phone]) on 07/22/2023 9:36:41 AM  Radiology US Venous Img Lower Bilateral (DVT) Result Date: 07/22/2023 CLINICAL DATA:  141700 Pulmonary embolism (HCC) 141700. Bilateral lower extremity swelling. Shortness of breath. EXAM: BILATERAL LOWER EXTREMITY VENOUS DOPPLER ULTRASOUND TECHNIQUE: Gray-scale sonography with compression, as well as color and duplex ultrasound, were performed to evaluate the deep venous system(s) from the level of the common femoral vein through the popliteal and proximal calf veins. COMPARISON:  None Available. FINDINGS: VENOUS Right lower extremity: Normal compressibility of the common femoral, profunda femoral and superficial femoral veins. There is thrombosis of the popliteal vein and 1 of the 2 posterior tibial vein. There is also thrombosis of the great saphenous vein at the saphenofemoral junction region. Left lower extremity: Normal compressibility of the common femoral, superficial femoral, and popliteal veins,  as well as the visualized calf veins. Visualized portions of profunda femoral vein and great saphenous vein unremarkable. No filling defects to suggest DVT on grayscale or color Doppler imaging. Doppler waveforms show normal direction of venous flow, normal respiratory plasticity and response to augmentation. OTHER Bilateral calf subcutaneous edema noted. Limitations: none IMPRESSION: 1. Right lower extremity DVT involving the popliteal and posterior tibial veins. There is also thrombosis of the right great saphenous vein at the saphenofemoral junction region. 2. No left lower extremity DVT. 3. Bilateral calf subcutaneous edema. Electronically Signed   By: Jules Schick M.D.   On: 07/22/2023 14:50   CT Angio Chest PE W/Cm &/Or Wo Cm Addendum Date: 07/22/2023 ADDENDUM REPORT: 07/22/2023 11:28 ADDENDUM: Critical Value/emergent results were called by telephone at the time of  interpretation on 07/22/2023 at 11:27 am to provider [facility] , who verbally acknowledged these results. Electronically Signed   By: Jules Schick M.D.   On: 07/22/2023 11:28   Result Date: 07/22/2023 CLINICAL DATA:  Pulmonary embolism (PE) suspected, low to intermediate prob, positive D-dimer. Positional chest pain for 2 days. EXAM: CT ANGIOGRAPHY CHEST WITH CONTRAST TECHNIQUE: Multidetector CT imaging of the chest was performed using the standard protocol during bolus administration of intravenous contrast. Multiplanar CT image reconstructions and MIPs were obtained to evaluate the vascular anatomy. RADIATION DOSE REDUCTION: This exam was performed according to the departmental dose-optimization program which includes automated exposure control, adjustment of the mA and/or kV according to patient size and/or use of iterative reconstruction technique. CONTRAST:  60mL OMNIPAQUE IOHEXOL 350 MG/ML SOLN COMPARISON:  CT angiography chest from 10/25/2004. FINDINGS: Cardiovascular: There is moderate to large volume acute pulmonary embolism.  On the left, there is nonocclusive thrombus in the left main pulmonary artery with extension into the lobar, segmental and proximal subsegmental pulmonary artery branches which is nonocclusive. On the right, there is also nonocclusive thrombus in the right main pulmonary artery with extension into the right upper lobe segmental pulmonary artery cough which is nonocclusive. However, there is occlusive thrombus in the middle lobe lobar pulmonary artery and there is nonocclusive thrombus in the right lung lower lobe lobar pulmonary artery with extension into the segmental and proximal subsegmental pulmonary arteries. There is dilation of the main pulmonary trunk measuring up to 3.4 cm, which is nonspecific but can be seen with pulmonary artery hypertension. There is flattening of the interventricular septum, concerning for right heart strain. There is linear opacity with associated volume loss in the middle lobe, which does not exhibit enhancement of the parenchyma and is concerning for lung infarction. Normal cardiac size. No pericardial effusion. No aortic aneurysm. Mediastinum/Nodes: Visualized thyroid gland appears grossly unremarkable. No solid / cystic mediastinal masses. The esophagus is nondistended precluding optimal assessment. No axillary, mediastinal or hilar lymphadenopathy by size criteria. Lungs/Pleura: The central tracheo-bronchial tree is patent. There is small right pleural effusion. There are patchy atelectatic changes in the right upper lobe and right lower lobe. There are focal ground-glass opacities in the right lung lower lobe inferiorly, which are nonspecific and may represent sequela of infection or inflammation. No suspicious lung mass. No left pleural effusion. No pneumothorax on either side. No suspicious lung nodule. The Upper Abdomen: There is a subcapsular 5 mm sized calcification in the right hepatic lobe, favored to represent calcified granuloma. There is a 2.1 x 2.1 cm simple cyst in  the right kidney. Remaining visualized upper abdominal viscera within normal limits. Musculoskeletal: The visualized soft tissues of the chest wall are grossly unremarkable. No suspicious osseous lesions. There are mild to moderate multilevel degenerative changes in the visualized spine. Multiple old healed right-sided rib fractures noted. Review of the MIP images confirms the above findings. IMPRESSION: 1. There is moderate to large volume acute pulmonary embolism with CT evidence of right heart strain. There is nonocclusive thrombus in the left main, right upper lobe and right lower lobe lobar pulmonary arteries. There is occlusive thrombus in the middle lobe lobar pulmonary artery. 2. There is a linear opacity with associated volume loss in the middle lobe, which does not exhibit enhancement of the parenchyma and is concerning for lung infarction. 3. Multiple other nonacute observations, as described above. 4. There are focal ground-glass opacities in the right lung lower lobe inferiorly, which are nonspecific and may  represent sequela of infection or inflammation. Electronically Signed: By: Jules Schick M.D. On: 07/22/2023 11:20   DG Chest Port 1 View Result Date: 07/22/2023 CLINICAL DATA:  Chest pain. EXAM: PORTABLE CHEST 1 VIEW COMPARISON:  10/19/2008. FINDINGS: Focal areas of atelectasis/scarring noted overlying the right lung base. There is associated mild to moderate elevation of right hemidiaphragm. Bilateral lung fields are otherwise clear. No dense consolidation. Bilateral costophrenic angles are clear. No pneumothorax. Normal cardio-mediastinal silhouette. No acute osseous abnormalities. The soft tissues are within normal limits. IMPRESSION: *No active disease. Electronically Signed   By: Jules Schick M.D.   On: 07/22/2023 09:46    Procedures .Critical Care  Performed by: Terrilee Files, MD Authorized by: Terrilee Files, MD   Critical care provider statement:    Critical care time  (minutes):  45   Critical care time was exclusive of:  Separately billable procedures and treating other patients   Critical care was necessary to treat or prevent imminent or life-threatening deterioration of the following conditions:  Respiratory failure   Critical care was time spent personally by me on the following activities:  Development of treatment plan with patient or surrogate, discussions with consultants, evaluation of patient's response to treatment, examination of patient, obtaining history from patient or surrogate, ordering and performing treatments and interventions, ordering and review of laboratory studies, ordering and review of radiographic studies, pulse oximetry and re-evaluation of patient's condition   I assumed direction of critical care for this patient from another provider in my specialty: no       Medications Ordered in ED Medications  heparin bolus via infusion 4,000 Units (4,000 Units Intravenous Bolus from Bag 07/22/23 1237)    Followed by  heparin ADULT infusion 100 units/mL (25000 units/266mL) (1,100 Units/hr Intravenous New Bag/Given 07/22/23 1240)  pantoprazole (PROTONIX) EC tablet 40 mg (40 mg Oral Given 07/22/23 1243)  acetaminophen (TYLENOL) tablet 650 mg (has no administration in time range)    Or  acetaminophen (TYLENOL) suppository 650 mg (has no administration in time range)  ondansetron (ZOFRAN) tablet 4 mg (has no administration in time range)    Or  ondansetron (ZOFRAN) injection 4 mg (has no administration in time range)  oxyCODONE (Oxy IR/ROXICODONE) immediate release tablet 5 mg (has no administration in time range)  HYDROmorphone (DILAUDID) injection 1 mg (has no administration in time range)  amLODipine (NORVASC) tablet 10 mg (10 mg Oral Given 07/22/23 1359)  hydrALAZINE (APRESOLINE) injection 10 mg (has no administration in time range)  aspirin chewable tablet 324 mg (324 mg Oral Given 07/22/23 1021)  sodium chloride 0.9 % bolus 500 mL (0 mLs  Intravenous Stopped 07/22/23 1235)  iohexol (OMNIPAQUE) 350 MG/ML injection 60 mL (60 mLs Intravenous Contrast Given 07/22/23 1035)    ED Course/ Medical Decision Making/ A&P Clinical Course as of 07/22/23 1659  Thu Jul 22, 2023  1126 Patient has large volume PE.  Have ordered heparin and will consult intensivist. [MB]  1152 Discussed with critical care Dr. Vassie Loll.  He is recommending standard therapy with IV heparin at this time.  He said over the next 24 hours if she worsens she may be a candidate for more aggressive treatment. [MB]  1220 Discussed with [facility] hospitalist Dr. Gwenlyn Perking who will evaluate patient for admission.  Patient has been updated on the plan and she is in agreement. [MB]    Clinical Course User Index [MB] Terrilee Files, MD  Medical Decision Making Amount and/or Complexity of Data Reviewed Labs: ordered. Radiology: ordered.  Risk OTC drugs. Prescription drug management. Decision regarding hospitalization.   This patient complains of chest pain and shortness of breath; this involves an extensive number of treatment Options and is a complaint that carries with it a high risk of complications and morbidity. The differential includes ACS, pneumonia, CHF, PE, vascular, pneumothorax, infection  I ordered, reviewed and interpreted labs, which included CBC normal, chemistries with new AKI, troponins elevated BNP elevated D-dimer elevated I ordered medication IV fluids, aspirin, IV heparin and reviewed PMP when indicated. I ordered imaging studies which included chest x-ray and CT angio chest and I independently    visualized and interpreted imaging which showed acute bilateral PE Previous records obtained and reviewed in epic including recent PCP note I consulted critical care Dr. Vassie Loll and [facility] hospitalist Dr. Gwenlyn Perking and discussed lab and imaging findings and discussed disposition.  Cardiac monitoring reviewed, normal sinus  rhythm Social determinants considered, no significant barriers Critical Interventions: Workup and initiation of IV heparin for patient's acute PE  After the interventions stated above, I reevaluated the patient and found patient still to be mildly increased work of breathing although hemodynamically stable Admission and further testing considered, no indications for thrombolytics at this time.  Patient will need admission to the hospital for IV heparin and further management.  She is in agreement with plan for admission.         Final Clinical Impression(s) / ED Diagnoses Final diagnoses:  Acute pulmonary embolism without acute cor pulmonale, unspecified pulmonary embolism type [facility])    Rx / DC Orders ED Discharge Orders     None         Terrilee Files, MD 07/22/23 [id number]

## 2023-07-22 NOTE — Progress Notes (Signed)
 PHARMACY - ANTICOAGULATION CONSULT NOTE  Pharmacy Consult for Heparin Indication: pulmonary embolus  No Known Allergies  Patient Measurements: Height: 5\' 4"  (162.6 cm) Weight: 69.9 kg (154 lb) IBW/kg (Calculated) : 54.7 HEPARIN DW (KG): 68.8   Vital Signs: Temp: 98.9 F (37.2 C) (03/20 1359) Temp Source: Oral (03/20 1359) BP: 150/89 (03/20 1500) Pulse Rate: 89 (03/20 1500)  Labs: Recent Labs    07/22/23 0933 07/22/23 1112  HGB 15.9*  --   HCT 47.7*  --   PLT 246  --   CREATININE 1.61*  --   TROPONINIHS 136* 120*   Estimated Creatinine Clearance: 32.5 mL/min (A) (by C-G formula based on SCr of 1.61 mg/dL (H)).  Medical History: Past Medical History:  Diagnosis Date   Hypertension    Medications:  See med rec  Assessment: 68 y.o. female. She is brought in by ambulance from her PCPs office after she presented there with 2 days of chest pain and shortness of breath. CTA shows s moderate to large volume acute pulmonary embolism with CT evidence of right heart strain. Patient not on oral anticoagulants PTA. Pharmacy asked to start Heparin.  Goal of Therapy:  Heparin level 0.3-0.7 units/ml Monitor platelets by anticoagulation protocol: Yes  Labs:  0320 1900 HL 0.30, therapeutic x 1    Plan:  HL on very low end of therapeutic range  Given PE, will increase rate to 1200 units/hr  Check next HL 6 hours after rate change  Monitor CBC daily and for s/sx of bleeding   Littie Deeds, PharmD Pharmacy Resident  07/22/2023 7:18 PM

## 2023-07-23 ENCOUNTER — Inpatient Hospital Stay (HOSPITAL_COMMUNITY)

## 2023-07-23 ENCOUNTER — Telehealth (HOSPITAL_COMMUNITY): Payer: Self-pay | Admitting: Pharmacy Technician

## 2023-07-23 ENCOUNTER — Other Ambulatory Visit (HOSPITAL_COMMUNITY): Payer: Self-pay

## 2023-07-23 DIAGNOSIS — I82401 Acute embolism and thrombosis of unspecified deep veins of right lower extremity: Secondary | ICD-10-CM

## 2023-07-23 DIAGNOSIS — I2699 Other pulmonary embolism without acute cor pulmonale: Secondary | ICD-10-CM | POA: Diagnosis not present

## 2023-07-23 DIAGNOSIS — I2609 Other pulmonary embolism with acute cor pulmonale: Secondary | ICD-10-CM

## 2023-07-23 LAB — HEPARIN LEVEL (UNFRACTIONATED)
Heparin Unfractionated: 0.24 [IU]/mL — ABNORMAL LOW (ref 0.30–0.70)
Heparin Unfractionated: 0.34 [IU]/mL (ref 0.30–0.70)
Heparin Unfractionated: 0.51 [IU]/mL (ref 0.30–0.70)

## 2023-07-23 LAB — CBC
HCT: 45 % (ref 36.0–46.0)
Hemoglobin: 15.1 g/dL — ABNORMAL HIGH (ref 12.0–15.0)
MCH: 32.4 pg (ref 26.0–34.0)
MCHC: 33.6 g/dL (ref 30.0–36.0)
MCV: 96.6 fL (ref 80.0–100.0)
Platelets: 254 10*3/uL (ref 150–400)
RBC: 4.66 MIL/uL (ref 3.87–5.11)
RDW: 14.7 % (ref 11.5–15.5)
WBC: 8.8 10*3/uL (ref 4.0–10.5)
nRBC: 0 % (ref 0.0–0.2)

## 2023-07-23 LAB — ECHOCARDIOGRAM COMPLETE
AR max vel: 2.67 cm2
AV Peak grad: 5.9 mmHg
Ao pk vel: 1.21 m/s
Area-P 1/2: 4.41 cm2
Height: 64 in
P 1/2 time: 329 ms
S' Lateral: 2.6 cm
Weight: 2464 [oz_av]

## 2023-07-23 LAB — MRSA NEXT GEN BY PCR, NASAL: MRSA by PCR Next Gen: NOT DETECTED

## 2023-07-23 LAB — BASIC METABOLIC PANEL
Anion gap: 6 (ref 5–15)
BUN: 30 mg/dL — ABNORMAL HIGH (ref 8–23)
CO2: 21 mmol/L — ABNORMAL LOW (ref 22–32)
Calcium: 8 mg/dL — ABNORMAL LOW (ref 8.9–10.3)
Chloride: 110 mmol/L (ref 98–111)
Creatinine, Ser: 1.31 mg/dL — ABNORMAL HIGH (ref 0.44–1.00)
GFR, Estimated: 45 mL/min — ABNORMAL LOW (ref >60)
Glucose, Bld: 136 mg/dL — ABNORMAL HIGH (ref 70–99)
Potassium: 3.3 mmol/L — ABNORMAL LOW (ref 3.5–5.1)
Sodium: 137 mmol/L (ref 135–145)

## 2023-07-23 MED ORDER — LACTATED RINGERS IV SOLN: INTRAVENOUS | Status: AC

## 2023-07-23 MED ORDER — HEPARIN BOLUS VIA INFUSION
2000.0000 [IU] | Freq: Once | INTRAVENOUS | Status: AC
  Administered 2023-07-23: 2000 [IU] via INTRAVENOUS
  Filled 2023-07-23: qty 2000

## 2023-07-23 MED ORDER — POTASSIUM CHLORIDE CRYS ER 20 MEQ PO TBCR
40.0000 meq | EXTENDED_RELEASE_TABLET | Freq: Four times a day (QID) | ORAL | Status: AC
  Administered 2023-07-23 – 2023-07-24 (×2): 40 meq via ORAL
  Filled 2023-07-23 (×2): qty 2

## 2023-07-23 NOTE — Progress Notes (Signed)
 PHARMACY - ANTICOAGULATION Pharmacy Consult for Heparin Indication: pulmonary embolus Brief A/P: Heparin level therapeutic.  Continue heparin at 1350 units / hr.  No Known Allergies  Patient Measurements: Height: 5\' 4"  (162.6 cm) Weight: 69.9 kg (154 lb) IBW/kg (Calculated) : 54.7 HEPARIN DW (KG): 68.8   Vital Signs: Temp: 98.6 F (37 C) (03/21 1137) Temp Source: Oral (03/21 1137) BP: 119/77 (03/21 1137) Pulse Rate: 94 (03/21 1137)  Labs: Recent Labs    07/22/23 0933 07/22/23 1112 07/22/23 1827 07/23/23 0111 07/23/23 0411 07/23/23 0924 07/23/23 1138  HGB 15.9*  --   --  15.1*  --   --   --   HCT 47.7*  --   --  45.0  --   --   --   PLT 246  --   --  254  --   --   --   HEPARINUNFRC  --   --    < > 0.24* 0.34  --  0.51  CREATININE 1.61*  --   --   --   --  1.31*  --   TROPONINIHS 136* 120*  --   --   --   --   --    < > = values in this interval not displayed.   Estimated Creatinine Clearance: 40 mL/min (A) (by C-G formula based on SCr of 1.31 mg/dL (H)).  Assessment: 68 y.o. female with PE for heparin  Goal of Therapy:  Heparin level 0.3-0.7 units/ml Monitor platelets by anticoagulation protocol: Yes   Plan:  Heparin at 1350 units / hr  Thank you.   Okey Regal, PharmD 07/23/2023 12:40 PM

## 2023-07-23 NOTE — Progress Notes (Signed)
  Echocardiogram 2D Echocardiogram has been performed.  Lucendia Herrlich 07/23/2023, 1:16 PM

## 2023-07-23 NOTE — Progress Notes (Signed)
 PHARMACY - ANTICOAGULATION Pharmacy Consult for Heparin Indication: pulmonary embolus Brief A/P: Heparin level subtherapeutic Increase Heparin rate  No Known Allergies  Patient Measurements: Height: 5\' 4"  (162.6 cm) Weight: 69.9 kg (154 lb) IBW/kg (Calculated) : 54.7 HEPARIN DW (KG): 68.8   Vital Signs: Temp: 97.9 F (36.6 C) (03/21 0400) Temp Source: Oral (03/21 0400) BP: 124/77 (03/21 0400) Pulse Rate: 90 (03/21 0400)  Labs: Recent Labs    07/22/23 0933 07/22/23 1112 07/22/23 1827  HGB 15.9*  --   --   HCT 47.7*  --   --   PLT 246  --   --   HEPARINUNFRC  --   --  0.30  CREATININE 1.61*  --   --   TROPONINIHS 136* 120*  --    Estimated Creatinine Clearance: 32.5 mL/min (A) (by C-G formula based on SCr of 1.61 mg/dL (H)).  Assessment: 68 y.o. female with PE for heparin  Goal of Therapy:  Heparin level 0.3-0.7 units/ml Monitor platelets by anticoagulation protocol: Yes   Plan:  Heparin 2000 units IV bolus, then increase heparin 1350 units/hr Check heparin level in 6 hours.   Geannie Risen, PharmD, BCPS   07/23/2023 5:18 AM

## 2023-07-23 NOTE — TOC Initial Note (Signed)
 Transition of Care [facility]) - Initial/Assessment Note    Patient Details  Name: Betty Zamora MRN: [id number] Date of Birth: 04/02/1956  [facility] CM/SW Contact:    Lamonte Sakai, Student-Social Work Phone Number: 07/23/2023, 8:58 AM  Clinical Narrative:                  Pt admitted from home. No current TOC needs, please consult as needs arise.        Patient Goals and CMS Choice            Expected Discharge Plan and Services       Living arrangements for the past 2 months: Single Family Home                                      Prior Living Arrangements/Services Living arrangements for the past 2 months: Single Family Home                     Activities of Daily Living      Permission Sought/Granted                  Emotional Assessment       Orientation: : Oriented to Situation, Oriented to  Time, Oriented to Place, Oriented to Self      Admission diagnosis:  Pulmonary embolism (HCC) [I26.99] Acute pulmonary embolism without acute cor pulmonale, unspecified pulmonary embolism type (HCC) [I26.99] Patient Active Problem List   Diagnosis Date Noted   Pulmonary embolism (HCC) 07/22/2023   Essential hypertension, benign 02/23/2017   Subclinical hyperthyroidism 02/08/2017   PCP:  Assunta Found, MD Pharmacy:   [facility] [address] [place] [address] [place] [address] [place] [address] Phone: [phone] Fax: [phone]     Social Drivers of Health (SDOH) Social History: SDOH Screenings   Tobacco Use: Medium Risk (07/22/2023)   SDOH Interventions:     Readmission Risk Interventions     No data to display

## 2023-07-23 NOTE — Telephone Encounter (Signed)
 Patient Product/process development scientist completed.    The patient is insured through [facility]. Patient has [employer], may use a copay card, and/or apply for patient assistance if available.    Ran test claim for Eliquis 5 mg and the current 30 day co-pay is $547.98 due to a $1650.00 deductible  Ran test claim for Xarelto 20 mg and the current 30 day co-pay is $540.55 due to a $1650.00 deductible  This test claim was processed through [employer]- copay amounts may vary at other pharmacies due to [employer], or as the patient moves through the different stages of their insurance plan.     Roland Earl, CPHT Pharmacy Technician III Certified Patient Advocate [facility] Pharm[facility] Patient Advocate Team Direct Number: [phone]  Fax: [phone]

## 2023-07-23 NOTE — Consult Note (Signed)
 NAME:  Betty Zamora, MRN:  [id number], DOB:  Jan 27, 1956, LOS: 1 ADMISSION DATE:  07/22/2023, CONSULTATION DATE:  07/23/23 REFERRING MD:  EDP CHIEF COMPLAINT:  Pulmonary embolism   History of Present Illness:  89F with history of hypertension, vit d deficiency, GERD and CKD who is admitted for pulmonary embolism and started on heparin drip.  Lower ext US shows right lower extremity DVT of the popliteal and posterior tibial veins Echo with LV EF 65-70%. RV systolic size and function are normal.   Trop 136 > 120, BNP 210  Pertinent  Medical History   Past Medical History:  Diagnosis Date   Hypertension    Significant Hospital Events: Including procedures, antibiotic start and stop dates in addition to other pertinent events   3/20 admitted for PE, started on heparin  Interim History / Subjective:   No acute events since admission Patient complains of right sided back and chest pain Breathing feels ok  Objective   Blood pressure 124/77, pulse 90, temperature 97.9 F (36.6 C), temperature source Oral, resp. rate 13, height 5\' 4"  (1.626 m), weight 69.9 kg, SpO2 95%.        Intake/Output Summary (Last 24 hours) at 07/23/2023 0848 Last data filed at 07/22/2023 1235 Gross per 24 hour  Intake 500 ml  Output --  Net 500 ml   Filed Weights   07/22/23 7829  Weight: 69.9 kg    Examination: General: elderly woman, no acute distress HENT: [place]/AT, moist mucous membranes Lungs: diminished breath sounds, no wheezing Cardiovascular: rrr, no murmurs Abdomen: soft, non-tender, non-distended Extremities: warm, no edema Neuro: alert, moving all extremities GU: n/a  Echo shows normal RV function  Resolved Hospital Problem list     Assessment & Plan:  Acute Pulmonary Embolism with pulmonary infarction Acute right lower extremity DVT  Plan: - continue heparin drip - transition to DOAC after 48 hours of heparin - Will plan for at least 6 months of anticoagulation but  thromboembolic disease appears to be unprovoked - CT abdomen/pelvis ordered for solid tumor work up  [employer] will continue to follow  [employer] (right click and "Reselect all SmartList Selections" daily)   Per primary  Labs   CBC: Recent Labs  Lab 07/22/23 0933 07/23/23 0111  WBC 8.6 8.8  HGB 15.9* 15.1*  HCT 47.7* 45.0  MCV 98.4 96.6  PLT 246 254    Basic Metabolic Panel: Recent Labs  Lab 07/22/23 0933 07/22/23 1112  NA 143  --   K 3.9  --   CL 109  --   CO2 24  --   GLUCOSE 100*  --   BUN 31*  --   CREATININE 1.61*  --   CALCIUM 8.3*  --   MG  --  2.1   GFR: Estimated Creatinine Clearance: 32.5 mL/min (A) (by C-G formula based on SCr of 1.61 mg/dL (H)). Recent Labs  Lab 07/22/23 0933 07/23/23 0111  WBC 8.6 8.8    Liver Function Tests: No results for input(s): "AST", "ALT", "ALKPHOS", "BILITOT", "PROT", "ALBUMIN" in the last 168 hours. No results for input(s): "LIPASE", "AMYLASE" in the last 168 hours. No results for input(s): "AMMONIA" in the last 168 hours.  ABG No results found for: "PHART", "PCO2ART", "PO2ART", "HCO3", "TCO2", "ACIDBASEDEF", "O2SAT"   Coagulation Profile: No results for input(s): "INR", "PROTIME" in the last 168 hours.  Cardiac Enzymes: No results for input(s): "CKTOTAL", "CKMB", "CKMBINDEX", "TROPONINI" in the last 168 hours.  HbA1C: No results found for: "HGBA1C"  CBG: No results for input(s): "GLUCAP" in the last 168 hours.  Review of Systems:   Review of Systems  Constitutional:  Negative for chills, fever, malaise/fatigue and weight loss.  HENT:  Negative for congestion, sinus pain and sore throat.   Eyes: Negative.   Respiratory:  Positive for shortness of breath. Negative for cough, hemoptysis, sputum production and wheezing.   Cardiovascular:  Positive for chest pain. Negative for palpitations, orthopnea, claudication and leg swelling.  Gastrointestinal:  Negative for abdominal pain, heartburn, nausea and  vomiting.  Genitourinary: Negative.   Musculoskeletal:  Negative for joint pain and myalgias.  Skin:  Negative for rash.  Neurological:  Negative for weakness.  Endo/Heme/Allergies: Negative.   Psychiatric/Behavioral: Negative.       Past Medical History:  She,  has a past medical history of Hypertension.   Surgical History:   Past Surgical History:  Procedure Laterality Date   ABDOMINAL HYSTERECTOMY       Social History:   reports that she has quit smoking. Her smoking use included cigarettes. She has never used smokeless tobacco. She reports current alcohol use. She reports that she does not use drugs.   Family History:  Her family history is not on file.   Allergies No Known Allergies   Home Medications  Prior to Admission medications   Medication Sig Start Date End Date Taking? Authorizing Provider  olmesartan (BENICAR) 40 MG tablet Take 40 mg by mouth daily. 07/14/23  Yes [provider]     Critical care time: n/a    Melody Comas, MD [facility] Office: [phone]   See Amion for personal pager PCCM on call pager [phone] until 7pm. Please call Elink 7p-7a. [phone]

## 2023-07-23 NOTE — Progress Notes (Addendum)
 PROGRESS NOTE    Betty Zamora  WUJ:[id number] DOB: 03-Feb-1956 DOA: 07/22/2023 PCP: Assunta Found, MD   Chief Complaint  Patient presents with   Chest Pain    Brief Narrative:   Betty Zamora is a 68 y.o. female with medical history significant of hypertension, vitamin D deficiency, GERD and chronic kidney disease; who presented to the hospital secondary to chest pain.  Patient reports approximately 2-3 days of pleuritic chest discomfort for with associated shortness of breath with activity.  Patient reports worsening pain with deep inspiration, movements and any coughing spell.     On further questioning she has noticed increased leg swelling bilaterally for over weeks now.   Patient expressed no dysuria, no hematuria, no melena, no hematochezia, no focal weaknesses, no hemoptysis, headaches, blurred vision, fever/chills or any sick contacts.   Workup in the ED demonstrating mildly elevated troponin, elevated D-dimer and CT angiogram with large pulmonary embolism and right heart strain.  EKG normal, without acute ischemic changes.  Case discussed with PCCM (Dr. Vassie Loll) with recommendations to treat with IV heparin and transferred to [facility] in case she required the need for thrombolysis therapy. [facility] contacted to place patient in the hospital for further evaluation and management.   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 -Continue as needed  analgesics. -Dense of pulmonary infarction on imaging, she was encouraged with incentive spirometry and flutter valve -Provoking factor I will obtain CT abdomen pelvis to rule out any malignancy -PCCM available for consultation in case of decompensation as she will need thrombolysis therapy.   Hypertension -Resume home antihypertensive agents -Closely follow vital signs -As needed hydralazine ordered.   chronic kidney disease stage IIIb -Most recent renal function for over a year ago -Patient denies any urinary retention or complaints of any urinary symptoms at the moment -Electrolytes are stable -Closely follow renal function trend -Maintain adequate hydration -Minimize nephrotoxic agents  Hypokalemia - replaced   GERD -Continue PPI.   history of vitamin D deficiency -Resume weekly high-dose vitamin D supplementation at discharge.   elevated troponins -Appears to be secondary to pulmonary embolism -EKG without acute ischemic changes -Follow 2D echo result -Continue treatment as mentioned above with heparin drip.       DVT prophylaxis: Heparin GTT Code Status: Full Family Communication: Discussed with patient Disposition:   Status is: Inpatient    Consultants:  None  Subjective: She does report some chest pain, denies dyspnea  Objective: Vitals:   07/23/23 0000 07/23/23 0400 07/23/23 0800 07/23/23 1137  BP: 115/77 124/77 (!) 148/90 119/77  Pulse: 85 90  94  Resp: 19 13 (!) 23 20  Temp: 97.6 F (36.4 C) 97.9 F (36.6 C) 98.1 F (36.7 C) 98.6 F (37 C)  TempSrc: Oral Oral Oral Oral  SpO2: 95% 95% 97% 95%  Weight:      Height:        Intake/Output Summary (Last 24 hours) at 07/23/2023 1315 Last data filed at 07/23/2023 1000 Gross per 24 hour  Intake 966.88 ml  Output --  Net 966.88 ml   Filed Weights   07/22/23 0907  Weight: 69.9 kg    Examination:  Awake Alert, Oriented X 3, No new F.N deficits, Normal affect Symmetrical Chest wall  movement, Good air movement bilaterally, CTAB RRR,No Gallops,Rubs or new Murmurs, No Parasternal Heave +ve B.Sounds, Abd Soft, No tenderness, No rebound - guarding or rigidity. No Cyanosis, Clubbing, mild right lower extremity edema    Data Reviewed: I have personally reviewed following labs and imaging studies  CBC: Recent Labs  Lab 07/22/23 0933 07/23/23 0111  WBC 8.6 8.8  HGB 15.9* 15.1*  HCT 47.7* 45.0  MCV 98.4 96.6  PLT 246 254    Basic Metabolic Panel: Recent Labs  Lab 07/22/23 0933 07/22/23 1112 07/23/23 0924  NA 143  --  137  K 3.9  --  3.3*  CL 109  --  110  CO2 24  --  21*  GLUCOSE 100*  --  136*  BUN 31*  --  30*  CREATININE 1.61*  --  1.31*  CALCIUM 8.3*  --  8.0*  MG  --  2.1  --     GFR: Estimated Creatinine Clearance: 40 mL/min (A) (by C-G formula based on SCr of 1.31 mg/dL (H)).  Liver Function Tests: No results for input(s): "AST", "ALT", "ALKPHOS", "BILITOT", "PROT", "ALBUMIN" in the last 168 hours.  CBG: No results for input(s): "GLUCAP" in the last 168 hours.   No results found for this or any previous visit (from the past 240 hours).       Radiology Studies: US Venous Img Lower Bilateral (DVT) Result Date: 07/22/2023 CLINICAL DATA:  141700 Pulmonary embolism (HCC) 141700. Bilateral lower extremity swelling. Shortness of breath. EXAM: BILATERAL LOWER EXTREMITY VENOUS DOPPLER ULTRASOUND TECHNIQUE: Gray-scale sonography with compression, as well as color and duplex ultrasound, were performed to evaluate the deep venous system(s) from the level of the common femoral vein through the popliteal and proximal calf veins. COMPARISON:  None Available. FINDINGS: VENOUS Right lower extremity: Normal compressibility of the common femoral, profunda femoral and superficial femoral veins. There is thrombosis of the popliteal vein and 1 of the 2 posterior tibial vein. There is also thrombosis of the great saphenous vein at the saphenofemoral junction  region. Left lower extremity: Normal compressibility of the common femoral, superficial femoral, and popliteal veins, as well as the visualized calf veins. Visualized portions of profunda femoral vein and great saphenous vein unremarkable. No filling defects to suggest DVT on grayscale or color Doppler imaging. Doppler waveforms show normal direction of venous flow, normal respiratory plasticity and response to augmentation. OTHER Bilateral calf subcutaneous edema noted. Limitations: none IMPRESSION: 1. Right lower extremity DVT involving the popliteal and posterior tibial veins. There is also thrombosis of the right great saphenous vein at the saphenofemoral junction region. 2. No left lower extremity DVT. 3. Bilateral calf subcutaneous edema. Electronically Signed   By: Jules Schick M.D.   On: 07/22/2023 14:50   CT Angio Chest PE W/Cm &/Or Wo Cm Addendum Date: 07/22/2023 ADDENDUM REPORT: 07/22/2023 11:28 ADDENDUM: Critical Value/emergent results were called by telephone at the time of interpretation on 07/22/2023 at 11:27 am to provider [facility] , who verbally acknowledged these results. Electronically Signed   By: Jules Schick M.D.   On: 07/22/2023 11:28   Result Date: 07/22/2023 CLINICAL DATA:  Pulmonary embolism (PE) suspected, low to intermediate prob, positive D-dimer. Positional chest pain for 2 days. EXAM: CT ANGIOGRAPHY CHEST WITH CONTRAST TECHNIQUE: Multidetector CT imaging of  the chest was performed using the standard protocol during bolus administration of intravenous contrast. Multiplanar CT image reconstructions and MIPs were obtained to evaluate the vascular anatomy. RADIATION DOSE REDUCTION: This exam was performed according to the departmental dose-optimization program which includes automated exposure control, adjustment of the mA and/or kV according to patient size and/or use of iterative reconstruction technique. CONTRAST:  60mL OMNIPAQUE IOHEXOL 350 MG/ML SOLN COMPARISON:  CT  angiography chest from 10/25/2004. FINDINGS: Cardiovascular: There is moderate to large volume acute pulmonary embolism. On the left, there is nonocclusive thrombus in the left main pulmonary artery with extension into the lobar, segmental and proximal subsegmental pulmonary artery branches which is nonocclusive. On the right, there is also nonocclusive thrombus in the right main pulmonary artery with extension into the right upper lobe segmental pulmonary artery cough which is nonocclusive. However, there is occlusive thrombus in the middle lobe lobar pulmonary artery and there is nonocclusive thrombus in the right lung lower lobe lobar pulmonary artery with extension into the segmental and proximal subsegmental pulmonary arteries. There is dilation of the main pulmonary trunk measuring up to 3.4 cm, which is nonspecific but can be seen with pulmonary artery hypertension. There is flattening of the interventricular septum, concerning for right heart strain. There is linear opacity with associated volume loss in the middle lobe, which does not exhibit enhancement of the parenchyma and is concerning for lung infarction. Normal cardiac size. No pericardial effusion. No aortic aneurysm. Mediastinum/Nodes: Visualized thyroid gland appears grossly unremarkable. No solid / cystic mediastinal masses. The esophagus is nondistended precluding optimal assessment. No axillary, mediastinal or hilar lymphadenopathy by size criteria. Lungs/Pleura: The central tracheo-bronchial tree is patent. There is small right pleural effusion. There are patchy atelectatic changes in the right upper lobe and right lower lobe. There are focal ground-glass opacities in the right lung lower lobe inferiorly, which are nonspecific and may represent sequela of infection or inflammation. No suspicious lung mass. No left pleural effusion. No pneumothorax on either side. No suspicious lung nodule. The Upper Abdomen: There is a subcapsular 5 mm sized  calcification in the right hepatic lobe, favored to represent calcified granuloma. There is a 2.1 x 2.1 cm simple cyst in the right kidney. Remaining visualized upper abdominal viscera within normal limits. Musculoskeletal: The visualized soft tissues of the chest wall are grossly unremarkable. No suspicious osseous lesions. There are mild to moderate multilevel degenerative changes in the visualized spine. Multiple old healed right-sided rib fractures noted. Review of the MIP images confirms the above findings. IMPRESSION: 1. There is moderate to large volume acute pulmonary embolism with CT evidence of right heart strain. There is nonocclusive thrombus in the left main, right upper lobe and right lower lobe lobar pulmonary arteries. There is occlusive thrombus in the middle lobe lobar pulmonary artery. 2. There is a linear opacity with associated volume loss in the middle lobe, which does not exhibit enhancement of the parenchyma and is concerning for lung infarction. 3. Multiple other nonacute observations, as described above. 4. There are focal ground-glass opacities in the right lung lower lobe inferiorly, which are nonspecific and may represent sequela of infection or inflammation. Electronically Signed: By: Jules Schick M.D. On: 07/22/2023 11:20   DG Chest Port 1 View Result Date: 07/22/2023 CLINICAL DATA:  Chest pain. EXAM: PORTABLE CHEST 1 VIEW COMPARISON:  10/19/2008. FINDINGS: Focal areas of atelectasis/scarring noted overlying the right lung base. There is associated mild to moderate elevation of right hemidiaphragm. Bilateral lung fields are otherwise  clear. No dense consolidation. Bilateral costophrenic angles are clear. No pneumothorax. Normal cardio-mediastinal silhouette. No acute osseous abnormalities. The soft tissues are within normal limits. IMPRESSION: *No active disease. Electronically Signed   By: Jules Schick M.D.   On: 07/22/2023 09:46        Scheduled Meds:  amLODipine  10  mg Oral Daily   pantoprazole  40 mg Oral Daily   Continuous Infusions:  heparin 1,350 Units/hr (07/23/23 1000)   lactated ringers 75 mL/hr at 07/23/23 1000     LOS: 1 day       Huey Bienenstock, MD [facility]   To contact the attending provider between 7A-7P or the covering provider during after hours 7P-7A, please log into the web site [email or web address] and access using universal [place] password for that web site. If you do not have the password, please call the hospital operator.  07/23/2023, 1:15 PM

## 2023-07-24 ENCOUNTER — Inpatient Hospital Stay (HOSPITAL_COMMUNITY)

## 2023-07-24 ENCOUNTER — Telehealth: Payer: Self-pay | Admitting: Pulmonary Disease

## 2023-07-24 DIAGNOSIS — I2699 Other pulmonary embolism without acute cor pulmonale: Secondary | ICD-10-CM | POA: Diagnosis not present

## 2023-07-24 DIAGNOSIS — I82401 Acute embolism and thrombosis of unspecified deep veins of right lower extremity: Secondary | ICD-10-CM | POA: Diagnosis not present

## 2023-07-24 LAB — CBC
HCT: 42.5 % (ref 36.0–46.0)
Hemoglobin: 14.3 g/dL (ref 12.0–15.0)
MCH: 32.8 pg (ref 26.0–34.0)
MCHC: 33.6 g/dL (ref 30.0–36.0)
MCV: 97.5 fL (ref 80.0–100.0)
Platelets: 254 10*3/uL (ref 150–400)
RBC: 4.36 MIL/uL (ref 3.87–5.11)
RDW: 14.3 % (ref 11.5–15.5)
WBC: 8.9 10*3/uL (ref 4.0–10.5)
nRBC: 0 % (ref 0.0–0.2)

## 2023-07-24 LAB — BASIC METABOLIC PANEL
Anion gap: 5 (ref 5–15)
BUN: 27 mg/dL — ABNORMAL HIGH (ref 8–23)
CO2: 23 mmol/L (ref 22–32)
Calcium: 8 mg/dL — ABNORMAL LOW (ref 8.9–10.3)
Chloride: 110 mmol/L (ref 98–111)
Creatinine, Ser: 1.17 mg/dL — ABNORMAL HIGH (ref 0.44–1.00)
GFR, Estimated: 51 mL/min — ABNORMAL LOW (ref >60)
Glucose, Bld: 98 mg/dL (ref 70–99)
Potassium: 4.3 mmol/L (ref 3.5–5.1)
Sodium: 138 mmol/L (ref 135–145)

## 2023-07-24 LAB — HEPARIN LEVEL (UNFRACTIONATED): Heparin Unfractionated: 0.12 [IU]/mL — ABNORMAL LOW (ref 0.30–0.70)

## 2023-07-24 LAB — BRAIN NATRIURETIC PEPTIDE: B Natriuretic Peptide: 123.6 pg/mL — ABNORMAL HIGH (ref 0.0–100.0)

## 2023-07-24 MED ORDER — APIXABAN 5 MG PO TABS
10.0000 mg | ORAL_TABLET | Freq: Two times a day (BID) | ORAL | Status: DC
  Administered 2023-07-24 – 2023-07-27 (×7): 10 mg via ORAL
  Filled 2023-07-24 (×7): qty 2

## 2023-07-24 MED ORDER — FUROSEMIDE 10 MG/ML IJ SOLN
40.0000 mg | Freq: Once | INTRAMUSCULAR | Status: AC
  Administered 2023-07-24: 40 mg via INTRAVENOUS
  Filled 2023-07-24: qty 4

## 2023-07-24 MED ORDER — APIXABAN 5 MG PO TABS: 5.0000 mg | ORAL_TABLET | Freq: Two times a day (BID) | ORAL | Status: DC

## 2023-07-24 NOTE — Plan of Care (Signed)

## 2023-07-24 NOTE — Progress Notes (Addendum)
 Addendum: Will stop heparin and transition to eliquis today.   PHARMACY - ANTICOAGULATION CONSULT NOTE  Pharmacy Consult for heparin Indication:  PE  No Known Allergies  Patient Measurements: Height: 5\' 4"  (162.6 cm) Weight: 69.9 kg (154 lb) IBW/kg (Calculated) : 54.7 Heparin Dosing Weight: 68.8 kg  Vital Signs: Temp: 99.4 F (37.4 C) (03/22 0800) Temp Source: Oral (03/22 0800) BP: 147/93 (03/22 0800) Pulse Rate: 90 (03/22 0600)  Labs: Recent Labs    07/22/23 0933 07/22/23 1112 07/22/23 1827 07/23/23 0111 07/23/23 0411 07/23/23 0924 07/23/23 1138 07/24/23 0542  HGB 15.9*  --   --  15.1*  --   --   --  14.3  HCT 47.7*  --   --  45.0  --   --   --  42.5  PLT 246  --   --  254  --   --   --  254  HEPARINUNFRC  --   --    < > 0.24* 0.34  --  0.51 0.12*  CREATININE 1.61*  --   --   --   --  1.31*  --  1.17*  TROPONINIHS 136* 120*  --   --   --   --   --   --    < > = values in this interval not displayed.    Estimated Creatinine Clearance: 44.8 mL/min (A) (by C-G formula based on SCr of 1.17 mg/dL (H)).   Medical History: Past Medical History:  Diagnosis Date   Hypertension     Medications:  Infusions:   heparin 1,350 Units/hr (07/24/23 0011)    Assessment: Patient is a 68 year old female with a large volume acute PE confirmed by CT with right heart strain. Patient is on room air saturating at 94% per most recent charting. Patient was not on anticoagulation prior to admission.   Heparin level low this morning at 0.12 on 1350 units/hour despite previous therapeutic values. Nurse notes that line was occluded this morning when she reported for her shift. No bleeding concerns noted and RN aware to message pharmacy with any concerns or issues with the line.   Patient has a very high copay for a DOAC due to a high deductible. Patient will need a coppay card or might be a good candidate for lovenox. MD opts to make no changes today due to concern for a hemothorax.    Goal of Therapy:  Heparin level 0.3-0.7 units/ml Monitor platelets by anticoagulation protocol: Yes   Plan:  Continue heparin at 1350 units/hr  Follow up DOAC tomorrow  Check anti-Xa level in 8 hours and daily while on heparin Continue to monitor H&H and platelets  Blane Ohara, PharmD, BCPS PGY2 Pharmacy Resident

## 2023-07-24 NOTE — Progress Notes (Addendum)
 PROGRESS NOTE    Betty Zamora  ZOX:[id number] DOB: 30-May-1955 DOA: 07/22/2023 PCP: Assunta Found, MD   Chief Complaint  Patient presents with   Chest Pain    Brief Narrative:   Betty Zamora is a 68 y.o. female with medical history significant of hypertension, vitamin D deficiency, GERD and chronic kidney disease; who presented to the hospital secondary to chest pain.  Patient reports approximately 2-3 days of pleuritic chest discomfort for with associated shortness of breath with activity.  Patient reports worsening pain with deep inspiration, movements and any coughing spell.     On further questioning she has noticed increased leg swelling bilaterally for over weeks now.   Patient expressed no dysuria, no hematuria, no melena, no hematochezia, no focal weaknesses, no hemoptysis, headaches, blurred vision, fever/chills or any sick contacts.   Workup in the ED demonstrating mildly elevated troponin, elevated D-dimer and CT angiogram with large pulmonary embolism and right heart strain.  EKG normal, without acute ischemic changes.  Case discussed with PCCM (Dr. Vassie Loll) with recommendations to treat with IV heparin and transferred to [facility] in case she required the need for thrombolysis therapy. TRH contacted to place patient in the hospital for further evaluation and management.   Assessment & Plan:   Principal Problem:   Pulmonary embolism (HCC)   acute pulmonary embolism Pulmonary infarction Acute right lower extremity DVT -No provoking factor, she is have an active job, reactive and ambulatory, no history of recent travel -No family history of VTE's -No oxygen requirement currently  -Stable blood pressure -Troponins are elevated but downtrending -2D echo with a preserved EF, and no concern of right heart strain. -Transition from heparin GTT to Eliquis -Doppler significant for acute right lower extremity DVT and lower extremity Dopplers has been  ordered -Close monitoring recommended given findings of large pulmonary embolism and right heart strain on CT. -Continue as needed analgesics. -Evidence of pulmonary infarction on imaging, she was encouraged with incentive spirometry and flutter valve -No provoking factor, CT abdomen pelvis has been obtained, no evidence or concern for malignancy  -Remains dyspneic today, with significant chest pain, this is likely due to her pulmonary infarction, it is limiting her good inspiratory effort, x-ray this morning showing worsening light lower lung opacity, most likely worsening atelectasis versus infarction, continue with pain medicine, encouraged use incentive spirometry and flutter valve again as well today.  Evidence of pleural effusion as well, but I do not think that significant were  it needs to be tapped, she received IV Lasix today as well.  Hypertension -Resume home antihypertensive agents -Closely follow vital signs -As needed hydralazine ordered.   chronic kidney disease stage IIIb -Most recent renal function for over a year ago -Patient denies any urinary retention or complaints of any urinary symptoms at the moment -Electrolytes are stable -Closely follow renal function trend -Maintain adequate hydration -Minimize nephrotoxic agents  Hypokalemia - replaced   GERD -Continue PPI.   history of vitamin D deficiency -Resume weekly high-dose vitamin D supplementation at discharge.   elevated troponins -Appears to be secondary to pulmonary embolism -EKG without acute ischemic changes -Follow 2D echo result -Continue treatment as mentioned above with heparin drip.       DVT prophylaxis: Heparin GTT Code Status: Full Family Communication: Discussed with patient Disposition:   Status is: Inpatient    Consultants:  None  Subjective: She does report some chest pain, denies dyspnea  Objective: Vitals:   07/23/23 2000 07/24/23 0420 07/24/23  0600 07/24/23 0800  BP:  (!) 142/79 (!) 127/92 127/73 (!) 147/93  Pulse: 94 90 90   Resp: 13 17  18   Temp: 98.1 F (36.7 C) 98.5 F (36.9 C) 98.6 F (37 C) 99.4 F (37.4 C)  TempSrc: Oral Oral Oral Oral  SpO2: 96%  98% 94%  Weight:      Height:        Intake/Output Summary (Last 24 hours) at 07/24/2023 1220 Last data filed at 07/23/2023 1600 Gross per 24 hour  Intake 590.42 ml  Output --  Net 590.42 ml   Filed Weights   07/22/23 0907  Weight: 69.9 kg    Examination:  Awake Alert, Oriented X 3, appears to be uncomfortable today with some pain Symmetrical Chest wall movement, diminished air entry at lung bases bilaterally with Rales in the right lung RRR,No Gallops,Rubs or new Murmurs, No Parasternal Heave +ve B.Sounds, Abd Soft, No tenderness, No rebound - guarding or rigidity. No Cyanosis, Clubbing or edema, No new Rash or bruise       Data Reviewed: I have personally reviewed following labs and imaging studies  CBC: Recent Labs  Lab 07/22/23 0933 07/23/23 0111 07/24/23 0542  WBC 8.6 8.8 8.9  HGB 15.9* 15.1* 14.3  HCT 47.7* 45.0 42.5  MCV 98.4 96.6 97.5  PLT 246 254 254    Basic Metabolic Panel: Recent Labs  Lab 07/22/23 0933 07/22/23 1112 07/23/23 0924 07/24/23 0542  NA 143  --  137 138  K 3.9  --  3.3* 4.3  CL 109  --  110 110  CO2 24  --  21* 23  GLUCOSE 100*  --  136* 98  BUN 31*  --  30* 27*  CREATININE 1.61*  --  1.31* 1.17*  CALCIUM 8.3*  --  8.0* 8.0*  MG  --  2.1  --   --     GFR: Estimated Creatinine Clearance: 44.8 mL/min (A) (by C-G formula based on SCr of 1.17 mg/dL (H)).  Liver Function Tests: No results for input(s): "AST", "ALT", "ALKPHOS", "BILITOT", "PROT", "ALBUMIN" in the last 168 hours.  CBG: No results for input(s): "GLUCAP" in the last 168 hours.   Recent Results (from the past 240 hours)  MRSA Next Gen by PCR, Nasal     Status: None   Collection Time: 07/23/23  4:12 PM   Specimen: Nasal Mucosa; Nasal Swab  Result Value Ref Range Status    MRSA by PCR Next Gen NOT DETECTED NOT DETECTED Final    Comment: (NOTE) The GeneXpert MRSA Assay (FDA approved for NASAL specimens only), is one component of a comprehensive MRSA colonization surveillance program. It is not intended to diagnose MRSA infection nor to guide or monitor treatment for MRSA infections. Test performance is not FDA approved in patients less than 85 years old. Performed at [facility], [address], [place], [place] [address]          Radiology Studies: DG Chest Port 1 View Result Date: 07/24/2023 CLINICAL DATA:  Worsening shortness of breath.  Pulmonary embolism. EXAM: PORTABLE CHEST 1 VIEW COMPARISON:  07/22/2023 FINDINGS: The heart size and mediastinal contours are within normal limits. Increased right lower lobe infiltrate and small right pleural effusion since prior exam. Left lung remains clear. Several old left rib fracture deformities again seen. IMPRESSION: Increased right lower lobe infiltrate and small right pleural effusion. Electronically Signed   By: Danae Orleans M.D.   On: 07/24/2023 10:46   CT  ABDOMEN PELVIS WO CONTRAST Result Date: 07/24/2023 CLINICAL DATA:  Unprovoked DVT and pulmonary embolus. To assess for malignancy. EXAM: CT ABDOMEN AND PELVIS WITHOUT CONTRAST TECHNIQUE: Multidetector CT imaging of the abdomen and pelvis was performed following the standard protocol without IV contrast. RADIATION DOSE REDUCTION: This exam was performed according to the departmental dose-optimization program which includes automated exposure control, adjustment of the mA and/or kV according to patient size and/or use of iterative reconstruction technique. COMPARISON:  CT abdomen and pelvis 05/03/2019.  CT chest 07/22/2023 FINDINGS: Lower chest: Bilateral pleural effusions and atelectasis or consolidation. See prior report of CT chest.Old rib fractures. Hepatobiliary: No focal liver abnormality is seen. No gallstones, gallbladder wall thickening, or  biliary dilatation. Pancreas: Unremarkable. No pancreatic ductal dilatation or surrounding inflammatory changes. Spleen: Normal in size without focal abnormality. Adrenals/Urinary Tract: Adrenal glands are unremarkable. Kidneys are normal, without renal calculi, focal lesion, or hydronephrosis. Bladder is unremarkable. Stomach/Bowel: Stomach is within normal limits. Appendix appears normal. No evidence of bowel wall thickening, distention, or inflammatory changes. Vascular/Lymphatic: Aortic atherosclerosis. No enlarged abdominal or pelvic lymph nodes. Reproductive: Status post hysterectomy. No adnexal masses. Other: No abdominal wall hernia or abnormality. No abdominopelvic ascites. Diffuse edema in the subcutaneous fat. Musculoskeletal: Degenerative changes in the spine. No acute or significant osseous findings. IMPRESSION: 1. Bilateral pleural effusions and basilar consolidation, greater on the right. 2. No primary carcinoma seen. Note that contrast enhanced CT would be more sensitive for evaluation of the solid organs. Electronically Signed   By: Burman Nieves M.D.   On: 07/24/2023 02:52   ECHOCARDIOGRAM COMPLETE Result Date: 07/23/2023    ECHOCARDIOGRAM REPORT   Patient Name:   Betty Zamora Date of Exam: 07/23/2023 Medical Rec #:  [id number]           Height:       64.0 in Accession #:    [id number]          Weight:       154.0 lb Date of Birth:  Nov 25, 1955            BSA:          1.751 m Patient Age:    68 years            BP:           119/77 mmHg Patient Gender: F                   HR:           94 bpm. Exam Location:  Inpatient Procedure: 2D Echo, Cardiac Doppler and Color Doppler (Both Spectral and Color            Flow Doppler were utilized during procedure). Indications:    Stroke I63.9  History:        Patient has no prior history of Echocardiogram examinations.                 Risk Factors:Hypertension.  Sonographer:    Lucendia Herrlich RCS Referring Phys: [phone] CARLOS MADERA IMPRESSIONS  1.  Left ventricular ejection fraction, by estimation, is 65 to 70%. Left ventricular ejection fraction by PLAX is 67 %. The left ventricle has normal function. The left ventricle has no regional wall motion abnormalities. There is moderate asymmetric left ventricular hypertrophy of the basal-septal segment. Left ventricular diastolic parameters are consistent with Grade I diastolic dysfunction (impaired relaxation).  2. Right ventricular systolic function is normal. The right ventricular size is normal. There is  normal pulmonary artery systolic pressure. The estimated right ventricular systolic pressure is 25.8 mmHg.  3. Left atrial size was mildly dilated.  4. The mitral valve is grossly normal. Trivial mitral valve regurgitation.  5. The aortic valve is tricuspid. Aortic valve regurgitation is trivial.  6. Aortic dilatation noted. There is borderline dilatation of the aortic root, measuring 38 mm. There is mild dilatation of the ascending aorta, measuring 41 mm.  7. The inferior vena cava is normal in size with greater than 50% respiratory variability, suggesting right atrial pressure of 3 mmHg. Comparison(s): No prior Echocardiogram. FINDINGS  Left Ventricle: Left ventricular ejection fraction, by estimation, is 65 to 70%. Left ventricular ejection fraction by PLAX is 67 %. The left ventricle has normal function. The left ventricle has no regional wall motion abnormalities. The left ventricular internal cavity size was normal in size. There is moderate asymmetric left ventricular hypertrophy of the basal-septal segment. Left ventricular diastolic parameters are consistent with Grade I diastolic dysfunction (impaired relaxation). Indeterminate filling pressures. Right Ventricle: The right ventricular size is normal. No increase in right ventricular wall thickness. Right ventricular systolic function is normal. There is normal pulmonary artery systolic pressure. The tricuspid regurgitant velocity is 2.39 m/s, and   with an assumed right atrial pressure of 3 mmHg, the estimated right ventricular systolic pressure is 25.8 mmHg. Left Atrium: Left atrial size was mildly dilated. Right Atrium: Right atrial size was normal in size. Pericardium: There is no evidence of pericardial effusion. Mitral Valve: The mitral valve is grossly normal. Trivial mitral valve regurgitation. Tricuspid Valve: The tricuspid valve is grossly normal. Tricuspid valve regurgitation is trivial. Aortic Valve: The aortic valve is tricuspid. Aortic valve regurgitation is trivial. Aortic regurgitation PHT measures 329 msec. Aortic valve peak gradient measures 5.9 mmHg. Pulmonic Valve: The pulmonic valve was grossly normal. Pulmonic valve regurgitation is trivial. Aorta: Aortic dilatation noted. There is borderline dilatation of the aortic root, measuring 38 mm. There is mild dilatation of the ascending aorta, measuring 41 mm. Venous: The inferior vena cava is normal in size with greater than 50% respiratory variability, suggesting right atrial pressure of 3 mmHg. IAS/Shunts: The interatrial septum is aneurysmal. No atrial level shunt detected by color flow Doppler.  LEFT VENTRICLE PLAX 2D LV EF:         Left            Diastology                ventricular     LV e' medial:    6.09 cm/s                ejection        LV E/e' medial:  10.2                fraction by     LV e' lateral:   8.16 cm/s                PLAX is 67      LV E/e' lateral: 7.6                %. LVIDd:         4.10 cm LVIDs:         2.60 cm LV PW:         1.10 cm LV IVS:        1.00 cm LVOT diam:     2.20 cm LV SV:  47 LV SV Index:   27 LVOT Area:     3.80 cm  RIGHT VENTRICLE             IVC RV S prime:     12.70 cm/s  IVC diam: 1.70 cm TAPSE (M-mode): 1.3 cm LEFT ATRIUM             Index        RIGHT ATRIUM           Index LA diam:        3.70 cm 2.11 cm/m   RA Area:     11.50 cm LA Vol (A2C):   44.0 ml 25.13 ml/m  RA Volume:   23.80 ml  13.59 ml/m LA Vol (A4C):   38.3 ml 21.88  ml/m LA Biplane Vol: 39.1 ml 22.33 ml/m  AORTIC VALVE AV Area (Vmax): 2.67 cm AV Vmax:        121.00 cm/s AV Peak Grad:   5.9 mmHg LVOT Vmax:      85.00 cm/s LVOT Vmean:     52.500 cm/s LVOT VTI:       0.124 m AI PHT:         329 msec  AORTA Ao Root diam: 3.80 cm Ao Asc diam:  4.10 cm MITRAL VALVE               TRICUSPID VALVE MV Area (PHT): 4.41 cm    TR Peak grad:   22.8 mmHg MV Decel Time: 172 msec    TR Vmax:        239.00 cm/s MV E velocity: 62.30 cm/s MV A velocity: 75.30 cm/s  SHUNTS MV E/A ratio:  0.83        Systemic VTI:  0.12 m                            Systemic Diam: 2.20 cm Zoila Shutter MD Electronically signed by Zoila Shutter MD Signature Date/Time: 07/23/2023/2:28:23 PM    Final    US Venous Img Lower Bilateral (DVT) Result Date: 07/22/2023 CLINICAL DATA:  141700 Pulmonary embolism (HCC) 141700. Bilateral lower extremity swelling. Shortness of breath. EXAM: BILATERAL LOWER EXTREMITY VENOUS DOPPLER ULTRASOUND TECHNIQUE: Gray-scale sonography with compression, as well as color and duplex ultrasound, were performed to evaluate the deep venous system(s) from the level of the common femoral vein through the popliteal and proximal calf veins. COMPARISON:  None Available. FINDINGS: VENOUS Right lower extremity: Normal compressibility of the common femoral, profunda femoral and superficial femoral veins. There is thrombosis of the popliteal vein and 1 of the 2 posterior tibial vein. There is also thrombosis of the great saphenous vein at the saphenofemoral junction region. Left lower extremity: Normal compressibility of the common femoral, superficial femoral, and popliteal veins, as well as the visualized calf veins. Visualized portions of profunda femoral vein and great saphenous vein unremarkable. No filling defects to suggest DVT on grayscale or color Doppler imaging. Doppler waveforms show normal direction of venous flow, normal respiratory plasticity and response to augmentation. OTHER  Bilateral calf subcutaneous edema noted. Limitations: none IMPRESSION: 1. Right lower extremity DVT involving the popliteal and posterior tibial veins. There is also thrombosis of the right great saphenous vein at the saphenofemoral junction region. 2. No left lower extremity DVT. 3. Bilateral calf subcutaneous edema. Electronically Signed   By: Jules Schick M.D.   On: 07/22/2023 14:50        Scheduled Meds:  amLODipine  10 mg Oral Daily   apixaban  10 mg Oral BID   Followed by   Melene Muller ON 07/31/2023] apixaban  5 mg Oral BID   pantoprazole  40 mg Oral Daily   Continuous Infusions:     LOS: 2 days       Huey Bienenstock, MD [facility]   To contact the attending provider between 7A-7P or the covering provider during after hours 7P-7A, please log into the web site [email or web address] and access using universal [place] password for that web site. If you do not have the password, please call the hospital operator.  07/24/2023, 12:20 PM

## 2023-07-24 NOTE — Discharge Instructions (Addendum)
 Information on my medicine - ELIQUIS (apixaban)  This medication education was reviewed with me or my healthcare representative as part of my discharge preparation.    Why was Eliquis prescribed for you? Eliquis was prescribed to treat blood clots that may have been found in the veins of your legs (deep vein thrombosis) or in your lungs (pulmonary embolism) and to reduce the risk of them occurring again.  What do You need to know about Eliquis ? The starting dose is 10 mg (two 5 mg tablets) taken TWICE daily for the FIRST SEVEN (7) DAYS, then on March 29th, the dose is reduced to ONE 5 mg tablet taken TWICE daily.  Eliquis may be taken with or without food.   Try to take the dose about the same time in the morning and in the evening. If you have difficulty swallowing the tablet whole please discuss with your pharmacist how to take the medication safely.  Take Eliquis exactly as prescribed and DO NOT stop taking Eliquis without talking to the doctor who prescribed the medication.  Stopping may increase your risk of developing a new blood clot.  Refill your prescription before you run out.  After discharge, you should have regular check-up appointments with your healthcare provider that is prescribing your Eliquis.    What do you do if you miss a dose? If a dose of ELIQUIS is not taken at the scheduled time, take it as soon as possible on the same day and twice-daily administration should be resumed. The dose should not be doubled to make up for a missed dose.  Important Safety Information A possible side effect of Eliquis is bleeding. You should call your healthcare provider right away if you experience any of the following: Bleeding from an injury or your nose that does not stop. Unusual colored urine (red or dark brown) or unusual colored stools (red or black). Unusual bruising for unknown reasons. A serious fall or if you hit your head (even if there is no bleeding).  Some  medicines may interact with Eliquis and might increase your risk of bleeding or clotting while on Eliquis. To help avoid this, consult your healthcare provider or pharmacist prior to using any new prescription or non-prescription medications, including herbals, vitamins, non-steroidal anti-inflammatory drugs (NSAIDs) and supplements.  This website has more information on Eliquis (apixaban): http://www.eliquis.com/eliquis/home

## 2023-07-24 NOTE — Telephone Encounter (Signed)
 Please schedule patient for hospital follow up for pulmonary embolism with APP or myself in 4-6 weeks. Can use a blocked slot if needed on  my schedule.  Thanks, JD

## 2023-07-24 NOTE — OR Nursing (Signed)
 0300: Patient to CT scan with RN, due to receiving a Heparin drip.

## 2023-07-24 NOTE — Consult Note (Signed)
 NAME:  Betty Zamora, MRN:  [id number], DOB:  02-04-1956, LOS: 2 ADMISSION DATE:  07/22/2023, CONSULTATION DATE:  07/23/23 REFERRING MD:  EDP CHIEF COMPLAINT:  Pulmonary embolism   History of Present Illness:  75F with history of hypertension, vit d deficiency, GERD and CKD who is admitted for pulmonary embolism and started on heparin drip.  Lower ext US shows right lower extremity DVT of the popliteal and posterior tibial veins Echo with LV EF 65-70%. RV systolic size and function are normal.   Trop 136 > 120, BNP 210  Pertinent  Medical History   Past Medical History:  Diagnosis Date   Hypertension    Significant Hospital Events: Including procedures, antibiotic start and stop dates in addition to other pertinent events   3/20 admitted for PE, started on heparin 3/21 remained on heparin drip, right sided chest pain  Interim History / Subjective:   No acute events since admission Breathing feels better along with chest pain  Objective   Blood pressure (!) 147/93, pulse 90, temperature 99.4 F (37.4 C), temperature source Oral, resp. rate 18, height 5\' 4"  (1.626 m), weight 69.9 kg, SpO2 94%.        Intake/Output Summary (Last 24 hours) at 07/24/2023 1112 Last data filed at 07/23/2023 1600 Gross per 24 hour  Intake 997.99 ml  Output --  Net 997.99 ml   Filed Weights   07/22/23 0907  Weight: 69.9 kg    Examination: General: elderly woman, no acute distress HENT: [place]/AT, moist mucous membranes Lungs: diminished breath sounds, crackles on right, no wheezing Cardiovascular: rrr, no murmurs Abdomen: soft, non-tender, non-distended Extremities: warm, no edema Neuro: alert, moving all extremities GU: n/a  Echo shows normal RV function  Resolved Hospital Problem list     Assessment & Plan:  Acute Pulmonary Embolism with pulmonary infarction Acute right lower extremity DVT  Plan: - transition to eliquis today - Will plan for at least 6 months of  anticoagulation but thromboembolic disease appears to be unprovoked - Will repeat CT Chest in the future regarding pulmonary infarction  PCCM will sign off. Messaged clinic to arrange outpatient follow up.  Best Practice (right click and "Reselect all SmartList Selections" daily)   Per primary  Labs   CBC: Recent Labs  Lab 07/22/23 0933 07/23/23 0111 07/24/23 0542  WBC 8.6 8.8 8.9  HGB 15.9* 15.1* 14.3  HCT 47.7* 45.0 42.5  MCV 98.4 96.6 97.5  PLT 246 254 254    Basic Metabolic Panel: Recent Labs  Lab 07/22/23 0933 07/22/23 1112 07/23/23 0924 07/24/23 0542  NA 143  --  137 138  K 3.9  --  3.3* 4.3  CL 109  --  110 110  CO2 24  --  21* 23  GLUCOSE 100*  --  136* 98  BUN 31*  --  30* 27*  CREATININE 1.61*  --  1.31* 1.17*  CALCIUM 8.3*  --  8.0* 8.0*  MG  --  2.1  --   --    GFR: Estimated Creatinine Clearance: 44.8 mL/min (A) (by C-G formula based on SCr of 1.17 mg/dL (H)). Recent Labs  Lab 07/22/23 0933 07/23/23 0111 07/24/23 0542  WBC 8.6 8.8 8.9    Liver Function Tests: No results for input(s): "AST", "ALT", "ALKPHOS", "BILITOT", "PROT", "ALBUMIN" in the last 168 hours. No results for input(s): "LIPASE", "AMYLASE" in the last 168 hours. No results for input(s): "AMMONIA" in the last 168 hours.  ABG No results found for: "PHART", "  PCO2ART", "PO2ART", "HCO3", "TCO2", "ACIDBASEDEF", "O2SAT"   Coagulation Profile: No results for input(s): "INR", "PROTIME" in the last 168 hours.  Cardiac Enzymes: No results for input(s): "CKTOTAL", "CKMB", "CKMBINDEX", "TROPONINI" in the last 168 hours.  HbA1C: No results found for: "HGBA1C"  CBG: No results for input(s): "GLUCAP" in the last 168 hours.  Critical care time: n/a    Melody Comas, MD [facility] Office: [phone]   See Amion for personal pager PCCM on call pager [phone] until 7pm. Please call Elink 7p-7a. [phone]

## 2023-07-25 DIAGNOSIS — I2699 Other pulmonary embolism without acute cor pulmonale: Secondary | ICD-10-CM | POA: Diagnosis not present

## 2023-07-25 LAB — CBC
HCT: 41.2 % (ref 36.0–46.0)
Hemoglobin: 14 g/dL (ref 12.0–15.0)
MCH: 32.6 pg (ref 26.0–34.0)
MCHC: 34 g/dL (ref 30.0–36.0)
MCV: 96 fL (ref 80.0–100.0)
Platelets: 277 10*3/uL (ref 150–400)
RBC: 4.29 MIL/uL (ref 3.87–5.11)
RDW: 14 % (ref 11.5–15.5)
WBC: 8.8 10*3/uL (ref 4.0–10.5)
nRBC: 0 % (ref 0.0–0.2)

## 2023-07-25 LAB — BASIC METABOLIC PANEL
Anion gap: 7 (ref 5–15)
BUN: 28 mg/dL — ABNORMAL HIGH (ref 8–23)
CO2: 19 mmol/L — ABNORMAL LOW (ref 22–32)
Calcium: 7.8 mg/dL — ABNORMAL LOW (ref 8.9–10.3)
Chloride: 110 mmol/L (ref 98–111)
Creatinine, Ser: 1.34 mg/dL — ABNORMAL HIGH (ref 0.44–1.00)
GFR, Estimated: 43 mL/min — ABNORMAL LOW (ref >60)
Glucose, Bld: 93 mg/dL (ref 70–99)
Potassium: 4.4 mmol/L (ref 3.5–5.1)
Sodium: 136 mmol/L (ref 135–145)

## 2023-07-25 LAB — GLUCOSE, CAPILLARY: Glucose-Capillary: 88 mg/dL (ref 70–99)

## 2023-07-25 NOTE — Plan of Care (Signed)

## 2023-07-25 NOTE — Progress Notes (Signed)
 PROGRESS NOTE    Betty Zamora  JWJ:[id number] DOB: November 19, 1955 DOA: 07/22/2023 PCP: Assunta Found, MD   Chief Complaint  Patient presents with   Chest Pain    Brief Narrative:   Betty Zamora is a 68 y.o. female with medical history significant of hypertension, vitamin D deficiency, GERD and chronic kidney disease; who presented to the hospital secondary to chest pain.  Patient reports approximately 2-3 days of pleuritic chest discomfort for with associated shortness of breath with activity.  Patient reports worsening pain with deep inspiration, movements and any coughing spell.     On further questioning she has noticed increased leg swelling bilaterally for over weeks now.   Patient expressed no dysuria, no hematuria, no melena, no hematochezia, no focal weaknesses, no hemoptysis, headaches, blurred vision, fever/chills or any sick contacts.   Workup in the ED demonstrating mildly elevated troponin, elevated D-dimer and CT angiogram with large pulmonary embolism and right heart strain.  EKG normal, without acute ischemic changes.  Case discussed with PCCM (Dr. Vassie Loll) with recommendations to treat with IV heparin and transferred to [facility] in case she required the need for thrombolysis therapy. TRH contacted to place patient in the hospital for further evaluation and management.   Assessment & Plan:   Principal Problem:   Pulmonary embolism (HCC)   acute pulmonary embolism Pulmonary infarction Acute right lower extremity DVT -No provoking factor, she is have an active job, reactive and ambulatory, no history of recent travel -No family history of VTE's -No oxygen requirement currently  -Stable blood pressure -Troponins are elevated but downtrending -2D echo with a preserved EF, and no concern of right heart strain. -Transition from heparin GTT to Eliquis -Doppler significant for acute right lower extremity DVT and lower extremity Dopplers has been  ordered -Close monitoring recommended given findings of large pulmonary embolism and right heart strain on CT. -Continue as needed analgesics. -Evidence of pulmonary infarction on imaging, she was encouraged with incentive spirometry and flutter valve -No provoking factor, CT abdomen pelvis has been obtained, no evidence or concern for malignancy  -Was encouraged use incentive spirometry, flutter valve, report her pleuritic right-sided chest pain has been improving but still present, she is on as needed pain regimen, with continued encouragement to use incentive spirometry and flutter valve not to develop atelectasis.   -He looks much more comfortable today on room air. -IV Lasix as needed, will hold on further Lasix today as creatinine is up today. -Will need repeat CT chest in the future regarding pulmonary infarction, to follow-up with PCCM as an outpatient.  Hypertension -Resume home antihypertensive agents -Closely follow vital signs -As needed hydralazine ordered.   chronic kidney disease stage IIIb -Most recent renal function for over a year ago -Patient denies any urinary retention or complaints of any urinary symptoms at the moment -Electrolytes are stable -Closely follow renal function trend -Maintain adequate hydration -Minimize nephrotoxic agents  Hypokalemia - replaced   GERD -Continue PPI.   history of vitamin D deficiency -Resume weekly high-dose vitamin D supplementation at discharge.   elevated troponins -Appears to be secondary to pulmonary embolism -EKG without acute ischemic changes -Follow 2D echo result -Continue treatment as mentioned above with heparin drip.       DVT prophylaxis: Heparin GTT Code Status: Full Family Communication: Discussed with patient Disposition:   Status is: Inpatient    Consultants:  PCCM  Subjective: Reports right chest pain much improved, no hypoxia,  Objective: Vitals:   07/25/23  0430 07/25/23 0500 07/25/23  0700 07/25/23 0818  BP: 130/76   (!) 140/89  Pulse: 80     Resp: 18 (!) 23 20 20   Temp: 99 F (37.2 C)   98.4 F (36.9 C)  TempSrc: Oral   Oral  SpO2:      Weight:      Height:       No intake or output data in the 24 hours ending 07/25/23 1153  Filed Weights   07/22/23 0907  Weight: 69.9 kg    Examination:  Awake Alert, Oriented X 3, No new F.N deficits, Normal affect Symmetrical Chest wall movement, air entry improved but remains diminished at right lung base with some Rales RRR,No Gallops,Rubs or new Murmurs, No Parasternal Heave +ve B.Sounds, Abd Soft, No tenderness, No rebound - guarding or rigidity. No Cyanosis, Clubbing or edema, No new Rash or bruise       Data Reviewed: I have personally reviewed following labs and imaging studies  CBC: Recent Labs  Lab 07/22/23 0933 07/23/23 0111 07/24/23 0542 07/25/23 0551  WBC 8.6 8.8 8.9 8.8  HGB 15.9* 15.1* 14.3 14.0  HCT 47.7* 45.0 42.5 41.2  MCV 98.4 96.6 97.5 96.0  PLT 246 254 254 277    Basic Metabolic Panel: Recent Labs  Lab 07/22/23 0933 07/22/23 1112 07/23/23 0924 07/24/23 0542 07/25/23 0551  NA 143  --  137 138 136  K 3.9  --  3.3* 4.3 4.4  CL 109  --  110 110 110  CO2 24  --  21* 23 19*  GLUCOSE 100*  --  136* 98 93  BUN 31*  --  30* 27* 28*  CREATININE 1.61*  --  1.31* 1.17* 1.34*  CALCIUM 8.3*  --  8.0* 8.0* 7.8*  MG  --  2.1  --   --   --     GFR: Estimated Creatinine Clearance: 39.1 mL/min (A) (by C-G formula based on SCr of 1.34 mg/dL (H)).  Liver Function Tests: No results for input(s): "AST", "ALT", "ALKPHOS", "BILITOT", "PROT", "ALBUMIN" in the last 168 hours.  CBG: Recent Labs  Lab 07/25/23 0817  GLUCAP 88     Recent Results (from the past 240 hours)  MRSA Next Gen by PCR, Nasal     Status: None   Collection Time: 07/23/23  4:12 PM   Specimen: Nasal Mucosa; Nasal Swab  Result Value Ref Range Status   MRSA by PCR Next Gen NOT DETECTED NOT DETECTED Final    Comment:  (NOTE) The GeneXpert MRSA Assay (FDA approved for NASAL specimens only), is one component of a comprehensive MRSA colonization surveillance program. It is not intended to diagnose MRSA infection nor to guide or monitor treatment for MRSA infections. Test performance is not FDA approved in patients less than 107 years old. Performed at [facility], [address], [place], [place] [address]          Radiology Studies: DG Chest Port 1 View Result Date: 07/24/2023 CLINICAL DATA:  Worsening shortness of breath.  Pulmonary embolism. EXAM: PORTABLE CHEST 1 VIEW COMPARISON:  07/22/2023 FINDINGS: The heart size and mediastinal contours are within normal limits. Increased right lower lobe infiltrate and small right pleural effusion since prior exam. Left lung remains clear. Several old left rib fracture deformities again seen. IMPRESSION: Increased right lower lobe infiltrate and small right pleural effusion. Electronically Signed   By: Danae Orleans M.D.   On: 07/24/2023 10:46   CT ABDOMEN PELVIS WO  CONTRAST Result Date: 07/24/2023 CLINICAL DATA:  Unprovoked DVT and pulmonary embolus. To assess for malignancy. EXAM: CT ABDOMEN AND PELVIS WITHOUT CONTRAST TECHNIQUE: Multidetector CT imaging of the abdomen and pelvis was performed following the standard protocol without IV contrast. RADIATION DOSE REDUCTION: This exam was performed according to the departmental dose-optimization program which includes automated exposure control, adjustment of the mA and/or kV according to patient size and/or use of iterative reconstruction technique. COMPARISON:  CT abdomen and pelvis 05/03/2019.  CT chest 07/22/2023 FINDINGS: Lower chest: Bilateral pleural effusions and atelectasis or consolidation. See prior report of CT chest.Old rib fractures. Hepatobiliary: No focal liver abnormality is seen. No gallstones, gallbladder wall thickening, or biliary dilatation. Pancreas: Unremarkable. No pancreatic ductal dilatation  or surrounding inflammatory changes. Spleen: Normal in size without focal abnormality. Adrenals/Urinary Tract: Adrenal glands are unremarkable. Kidneys are normal, without renal calculi, focal lesion, or hydronephrosis. Bladder is unremarkable. Stomach/Bowel: Stomach is within normal limits. Appendix appears normal. No evidence of bowel wall thickening, distention, or inflammatory changes. Vascular/Lymphatic: Aortic atherosclerosis. No enlarged abdominal or pelvic lymph nodes. Reproductive: Status post hysterectomy. No adnexal masses. Other: No abdominal wall hernia or abnormality. No abdominopelvic ascites. Diffuse edema in the subcutaneous fat. Musculoskeletal: Degenerative changes in the spine. No acute or significant osseous findings. IMPRESSION: 1. Bilateral pleural effusions and basilar consolidation, greater on the right. 2. No primary carcinoma seen. Note that contrast enhanced CT would be more sensitive for evaluation of the solid organs. Electronically Signed   By: Burman Nieves M.D.   On: 07/24/2023 02:52   ECHOCARDIOGRAM COMPLETE Result Date: 07/23/2023    ECHOCARDIOGRAM REPORT   Patient Name:   FLORINA GLAS Date of Exam: 07/23/2023 Medical Rec #:  [id number]           Height:       64.0 in Accession #:    [id number]          Weight:       154.0 lb Date of Birth:  May 15, 1955            BSA:          1.751 m Patient Age:    68 years            BP:           119/77 mmHg Patient Gender: F                   HR:           94 bpm. Exam Location:  Inpatient Procedure: 2D Echo, Cardiac Doppler and Color Doppler (Both Spectral and Color            Flow Doppler were utilized during procedure). Indications:    Stroke I63.9  History:        Patient has no prior history of Echocardiogram examinations.                 Risk Factors:Hypertension.  Sonographer:    Lucendia Herrlich RCS Referring Phys: [phone] CARLOS MADERA IMPRESSIONS  1. Left ventricular ejection fraction, by estimation, is 65 to 70%. Left  ventricular ejection fraction by PLAX is 67 %. The left ventricle has normal function. The left ventricle has no regional wall motion abnormalities. There is moderate asymmetric left ventricular hypertrophy of the basal-septal segment. Left ventricular diastolic parameters are consistent with Grade I diastolic dysfunction (impaired relaxation).  2. Right ventricular systolic function is normal. The right ventricular size is normal. There is normal pulmonary artery  systolic pressure. The estimated right ventricular systolic pressure is 25.8 mmHg.  3. Left atrial size was mildly dilated.  4. The mitral valve is grossly normal. Trivial mitral valve regurgitation.  5. The aortic valve is tricuspid. Aortic valve regurgitation is trivial.  6. Aortic dilatation noted. There is borderline dilatation of the aortic root, measuring 38 mm. There is mild dilatation of the ascending aorta, measuring 41 mm.  7. The inferior vena cava is normal in size with greater than 50% respiratory variability, suggesting right atrial pressure of 3 mmHg. Comparison(s): No prior Echocardiogram. FINDINGS  Left Ventricle: Left ventricular ejection fraction, by estimation, is 65 to 70%. Left ventricular ejection fraction by PLAX is 67 %. The left ventricle has normal function. The left ventricle has no regional wall motion abnormalities. The left ventricular internal cavity size was normal in size. There is moderate asymmetric left ventricular hypertrophy of the basal-septal segment. Left ventricular diastolic parameters are consistent with Grade I diastolic dysfunction (impaired relaxation). Indeterminate filling pressures. Right Ventricle: The right ventricular size is normal. No increase in right ventricular wall thickness. Right ventricular systolic function is normal. There is normal pulmonary artery systolic pressure. The tricuspid regurgitant velocity is 2.39 m/s, and  with an assumed right atrial pressure of 3 mmHg, the estimated right  ventricular systolic pressure is 25.8 mmHg. Left Atrium: Left atrial size was mildly dilated. Right Atrium: Right atrial size was normal in size. Pericardium: There is no evidence of pericardial effusion. Mitral Valve: The mitral valve is grossly normal. Trivial mitral valve regurgitation. Tricuspid Valve: The tricuspid valve is grossly normal. Tricuspid valve regurgitation is trivial. Aortic Valve: The aortic valve is tricuspid. Aortic valve regurgitation is trivial. Aortic regurgitation PHT measures 329 msec. Aortic valve peak gradient measures 5.9 mmHg. Pulmonic Valve: The pulmonic valve was grossly normal. Pulmonic valve regurgitation is trivial. Aorta: Aortic dilatation noted. There is borderline dilatation of the aortic root, measuring 38 mm. There is mild dilatation of the ascending aorta, measuring 41 mm. Venous: The inferior vena cava is normal in size with greater than 50% respiratory variability, suggesting right atrial pressure of 3 mmHg. IAS/Shunts: The interatrial septum is aneurysmal. No atrial level shunt detected by color flow Doppler.  LEFT VENTRICLE PLAX 2D LV EF:         Left            Diastology                ventricular     LV e' medial:    6.09 cm/s                ejection        LV E/e' medial:  10.2                fraction by     LV e' lateral:   8.16 cm/s                PLAX is 67      LV E/e' lateral: 7.6                %. LVIDd:         4.10 cm LVIDs:         2.60 cm LV PW:         1.10 cm LV IVS:        1.00 cm LVOT diam:     2.20 cm LV SV:         47 LV SV  Index:   27 LVOT Area:     3.80 cm  RIGHT VENTRICLE             IVC RV S prime:     12.70 cm/s  IVC diam: 1.70 cm TAPSE (M-mode): 1.3 cm LEFT ATRIUM             Index        RIGHT ATRIUM           Index LA diam:        3.70 cm 2.11 cm/m   RA Area:     11.50 cm LA Vol (A2C):   44.0 ml 25.13 ml/m  RA Volume:   23.80 ml  13.59 ml/m LA Vol (A4C):   38.3 ml 21.88 ml/m LA Biplane Vol: 39.1 ml 22.33 ml/m  AORTIC VALVE AV Area  (Vmax): 2.67 cm AV Vmax:        121.00 cm/s AV Peak Grad:   5.9 mmHg LVOT Vmax:      85.00 cm/s LVOT Vmean:     52.500 cm/s LVOT VTI:       0.124 m AI PHT:         329 msec  AORTA Ao Root diam: 3.80 cm Ao Asc diam:  4.10 cm MITRAL VALVE               TRICUSPID VALVE MV Area (PHT): 4.41 cm    TR Peak grad:   22.8 mmHg MV Decel Time: 172 msec    TR Vmax:        239.00 cm/s MV E velocity: 62.30 cm/s MV A velocity: 75.30 cm/s  SHUNTS MV E/A ratio:  0.83        Systemic VTI:  0.12 m                            Systemic Diam: 2.20 cm Zoila Shutter MD Electronically signed by Zoila Shutter MD Signature Date/Time: 07/23/2023/2:28:23 PM    Final         Scheduled Meds:  amLODipine  10 mg Oral Daily   apixaban  10 mg Oral BID   Followed by   Melene Muller ON 07/31/2023] apixaban  5 mg Oral BID   pantoprazole  40 mg Oral Daily   Continuous Infusions:     LOS: 3 days       Huey Bienenstock, MD [facility]   To contact the attending provider between 7A-7P or the covering provider during after hours 7P-7A, please log into the web site [email or web address] and access using universal [place] password for that web site. If you do not have the password, please call the hospital operator.  07/25/2023, 11:53 AM

## 2023-07-26 DIAGNOSIS — I2699 Other pulmonary embolism without acute cor pulmonale: Secondary | ICD-10-CM | POA: Diagnosis not present

## 2023-07-26 LAB — CBC
HCT: 43.9 % (ref 36.0–46.0)
Hemoglobin: 14.3 g/dL (ref 12.0–15.0)
MCH: 31.8 pg (ref 26.0–34.0)
MCHC: 32.6 g/dL (ref 30.0–36.0)
MCV: 97.8 fL (ref 80.0–100.0)
Platelets: 293 10*3/uL (ref 150–400)
RBC: 4.49 MIL/uL (ref 3.87–5.11)
RDW: 13.8 % (ref 11.5–15.5)
WBC: 7.9 10*3/uL (ref 4.0–10.5)
nRBC: 0 % (ref 0.0–0.2)

## 2023-07-26 LAB — BASIC METABOLIC PANEL
Anion gap: 5 (ref 5–15)
BUN: 32 mg/dL — ABNORMAL HIGH (ref 8–23)
CO2: 21 mmol/L — ABNORMAL LOW (ref 22–32)
Calcium: 7.9 mg/dL — ABNORMAL LOW (ref 8.9–10.3)
Chloride: 111 mmol/L (ref 98–111)
Creatinine, Ser: 1.47 mg/dL — ABNORMAL HIGH (ref 0.44–1.00)
GFR, Estimated: 39 mL/min — ABNORMAL LOW (ref >60)
Glucose, Bld: 113 mg/dL — ABNORMAL HIGH (ref 70–99)
Potassium: 4.2 mmol/L (ref 3.5–5.1)
Sodium: 137 mmol/L (ref 135–145)

## 2023-07-26 LAB — BRAIN NATRIURETIC PEPTIDE: B Natriuretic Peptide: 123.9 pg/mL — ABNORMAL HIGH (ref 0.0–100.0)

## 2023-07-26 NOTE — Plan of Care (Signed)

## 2023-07-26 NOTE — Progress Notes (Addendum)
   07/26/23 1249  Mobility  Activity Ambulated with assistance in hallway  Level of Assistance Contact guard assist, steadying assist (CG to MinA d/t SOB/ Pain)  Assistive Device Other (Comment) (Reaching for Furniture (handrails/ bedrails))  Distance Ambulated (ft) 60 ft  Activity Response Tolerated fair  Mobility Referral Yes  Mobility visit 1 Mobility  Mobility Specialist Start Time (ACUTE ONLY) 1236  Mobility Specialist Stop Time (ACUTE ONLY) 1249  Mobility Specialist Time Calculation (min) (ACUTE ONLY) 13 min   Mobility Specialist: Progress Note  Pt visited earlier, received in bed - required new SPO2 cord as there was no pleth reading. SV for bed mobility. Returned to sitting EOB awaiting walk with all needs met - call bell within reach. .   Pre Mobility: HR 97, SPO2 93% RA During Mobility: HR 100s, SPO2 87-93% RA Post Mobility: HR 93, SPO2 92% RA Pt agreeable to mobility session - received in bed. C/o  BLE pain rated 8/10 and chest pain rated 8/10 requiring one seated break. Returned to EOB with all needs met - call bell within reach.    Barnie Mort, BS Mobility Specialist Please contact via SecureChat or  Rehab office at [phone].

## 2023-07-26 NOTE — Progress Notes (Signed)
 PROGRESS NOTE    Betty Zamora  ZOX:[id number] DOB: Apr 27, 1956 DOA: 07/22/2023 PCP: Assunta Found, MD   Chief Complaint  Patient presents with   Chest Pain    Brief Narrative:   Betty Zamora is a 68 y.o. female with medical history significant of hypertension, vitamin D deficiency, GERD and chronic kidney disease; who presented to the hospital secondary to chest pain.  Patient reports approximately 2-3 days of pleuritic chest discomfort for with associated shortness of breath with activity.  Patient reports worsening pain with deep inspiration, movements and any coughing spell.     On further questioning she has noticed increased leg swelling bilaterally for over weeks now.   Patient expressed no dysuria, no hematuria, no melena, no hematochezia, no focal weaknesses, no hemoptysis, headaches, blurred vision, fever/chills or any sick contacts.   Workup in the ED demonstrating mildly elevated troponin, elevated D-dimer and CT angiogram with large pulmonary embolism and right heart strain.  EKG normal, without acute ischemic changes.  Case discussed with PCCM (Dr. Vassie Loll) with recommendations to treat with IV heparin and transferred to [facility] in case she required the need for thrombolysis therapy. TRH contacted to place patient in the hospital for further evaluation and management.   Assessment & Plan:   Principal Problem:   Pulmonary embolism (HCC)   acute pulmonary embolism Pulmonary infarction Acute right lower extremity DVT -No provoking factor, she is have an active job, reactive and ambulatory, no history of recent travel -No family history of VTE's -No oxygen requirement currently  -Stable blood pressure -Troponins are elevated but downtrending -2D echo with a preserved EF, and no concern of right heart strain. -Transition from heparin GTT to Eliquis -Doppler significant for acute right lower extremity DVT and lower extremity Dopplers has been  ordered -Close monitoring recommended given findings of large pulmonary embolism and right heart strain on CT. -Evidence of pulmonary infarction on imaging, she was encouraged with incentive spirometry and flutter valve -No provoking factor, CT abdomen pelvis has been obtained, no evidence or concern for malignancy  -Was encouraged use incentive spirometry, flutter valve, report her pleuritic right-sided chest pain has been improving but still present, she is on as needed pain regimen, with continued encouragement to use incentive spirometry and flutter valve not to develop atelectasis.   -He is tolerating room air. -IV Lasix as needed, holding further Lasix given creatinine trending up. -Will need repeat CT chest in the future regarding pulmonary infarction, to follow-up with PCCM as an outpatient. -Was encouraged to keep using incentive spirometry and flutter valve, she still having significant pleuritic chest pain secondary to pulmonary infarction, will ambulate in the hallway today.  Hypertension -Resume home antihypertensive agents -Closely follow vital signs -As needed hydralazine ordered.   chronic kidney disease stage IIIb -Most recent renal function for over a year ago -Patient denies any urinary retention or complaints of any urinary symptoms at the moment -Electrolytes are stable -Closely follow renal function trend -Maintain adequate hydration -Minimize nephrotoxic agents  Hypokalemia - replaced   GERD -Continue PPI.   history of vitamin D deficiency -Resume weekly high-dose vitamin D supplementation at discharge.   elevated troponins -Appears to be secondary to pulmonary embolism -EKG without acute ischemic changes -Follow 2D echo result -Continue treatment as mentioned above with heparin drip.       DVT prophylaxis: Heparin GTT Code Status: Full Family Communication: Discussed with patient Disposition:   Status is: Inpatient    Consultants:   PCCM  Subjective: She is with significant pleuritic chest pain at right middle chest area of pulmonary infarct, she was encouraged to keep using incentive spirometer, flutter valve.  Objective: Vitals:   07/25/23 0818 07/25/23 2352 07/26/23 0500 07/26/23 0757  BP: (!) 140/89 127/89  129/85  Pulse:  90    Resp: 20 17  (!) 24  Temp: 98.4 F (36.9 C) 98.2 F (36.8 C)  98.5 F (36.9 C)  TempSrc: Oral Oral  Oral  SpO2:  98%    Weight:   69.6 kg   Height:       No intake or output data in the 24 hours ending 07/26/23 1352  Filed Weights   07/22/23 0907 07/26/23 0500  Weight: 69.9 kg 69.6 kg    Examination:  Awake Alert, Oriented X 3, No new F.N deficits, Normal affect Symmetrical Chest wall movement, air entry improved but remains diminished at right lung base with some Rales RRR,No Gallops,Rubs or new Murmurs, No Parasternal Heave +ve B.Sounds, Abd Soft, No tenderness, No rebound - guarding or rigidity. No Cyanosis, Clubbing or edema, No new Rash or bruise        Data Reviewed: I have personally reviewed following labs and imaging studies  CBC: Recent Labs  Lab 07/22/23 0933 07/23/23 0111 07/24/23 0542 07/25/23 0551 07/26/23 0508  WBC 8.6 8.8 8.9 8.8 7.9  HGB 15.9* 15.1* 14.3 14.0 14.3  HCT 47.7* 45.0 42.5 41.2 43.9  MCV 98.4 96.6 97.5 96.0 97.8  PLT 246 254 254 277 293    Basic Metabolic Panel: Recent Labs  Lab 07/22/23 0933 07/22/23 1112 07/23/23 0924 07/24/23 0542 07/25/23 0551 07/26/23 0508  NA 143  --  137 138 136 137  K 3.9  --  3.3* 4.3 4.4 4.2  CL 109  --  110 110 110 111  CO2 24  --  21* 23 19* 21*  GLUCOSE 100*  --  136* 98 93 113*  BUN 31*  --  30* 27* 28* 32*  CREATININE 1.61*  --  1.31* 1.17* 1.34* 1.47*  CALCIUM 8.3*  --  8.0* 8.0* 7.8* 7.9*  MG  --  2.1  --   --   --   --     GFR: Estimated Creatinine Clearance: 35.6 mL/min (A) (by C-G formula based on SCr of 1.47 mg/dL (H)).  Liver Function Tests: No results for input(s):  "AST", "ALT", "ALKPHOS", "BILITOT", "PROT", "ALBUMIN" in the last 168 hours.  CBG: Recent Labs  Lab 07/25/23 0817  GLUCAP 88     Recent Results (from the past 240 hours)  MRSA Next Gen by PCR, Nasal     Status: None   Collection Time: 07/23/23  4:12 PM   Specimen: Nasal Mucosa; Nasal Swab  Result Value Ref Range Status   MRSA by PCR Next Gen NOT DETECTED NOT DETECTED Final    Comment: (NOTE) The GeneXpert MRSA Assay (FDA approved for NASAL specimens only), is one component of a comprehensive MRSA colonization surveillance program. It is not intended to diagnose MRSA infection nor to guide or monitor treatment for MRSA infections. Test performance is not FDA approved in patients less than 63 years old. Performed at [facility], [address], [place], [place] [address]          Radiology Studies: No results found.       Scheduled Meds:  amLODipine  10 mg Oral Daily   apixaban  10 mg Oral BID   Followed by   Melene Muller  ON 07/31/2023] apixaban  5 mg Oral BID   pantoprazole  40 mg Oral Daily   Continuous Infusions:     LOS: 4 days       Huey Bienenstock, MD [facility]   To contact the attending provider between 7A-7P or the covering provider during after hours 7P-7A, please log into the web site [email or web address] and access using universal [place] password for that web site. If you do not have the password, please call the hospital operator.  07/26/2023, 1:52 PM

## 2023-07-26 NOTE — Evaluation (Signed)
 Physical Therapy Evaluation Patient Details Name: Betty Zamora MRN: [id number] DOB: [birth year] Today's Date: 07/26/2023  History of Present Illness  68 y.o. female presents to [facility] hospital on 07/22/2023 with chest pain and SOB. CTA with findings of large PE with R heart strain. Pt also with RLE DVT. PMH includes HTN, GERD, CKD.  Clinical Impression  Pt presents to PT with deficits in activity tolerance and cardiopulmonary function. Pt is able to ambulate for household distances however she demonstrates increased work of breathing and desaturates to 86% when mobilizing on room air. Pt will benefit from frequent mobilization in an effort to improve pulmonary function and activity tolerance. PT does not anticipate any post-acute PT needs.        If plan is discharge home, recommend the following: A little help with bathing/dressing/bathroom;Assistance with cooking/housework;Help with stairs or ramp for entrance   Can travel by private vehicle        Equipment Recommendations None recommended by PT  Recommendations for Other Services       Functional Status Assessment Patient has had a recent decline in their functional status and demonstrates the ability to make significant improvements in function in a reasonable and predictable amount of time.     Precautions / Restrictions Precautions Precautions: Fall;Other (comment) Recall of Precautions/Restrictions: Intact Precaution/Restrictions Comments: monitor SpO2 levels Restrictions Weight Bearing Restrictions Per Provider Order: No      Mobility  Bed Mobility Overal bed mobility: Independent                  Transfers Overall transfer level: Independent Equipment used: None                    Ambulation/Gait Ambulation/Gait assistance: Modified independent (Device/Increase time) Gait Distance (Feet): 300 Feet Assistive device: None Gait Pattern/deviations: Step-through pattern, Decreased stance time -  right Gait velocity: functional Gait velocity interpretation: 1.31 - 2.62 ft/sec, indicative of limited community ambulator   General Gait Details: pt with slowed step-through gait, reduced stance time on RLE  Stairs            Wheelchair Mobility     Tilt Bed    Modified Rankin (Stroke Patients Only)       Balance Overall balance assessment: Needs assistance Sitting-balance support: No upper extremity supported, Feet supported Sitting balance-Leahy Scale: Good     Standing balance support: No upper extremity supported, During functional activity Standing balance-Leahy Scale: Good                               Pertinent Vitals/Pain Pain Assessment Pain Assessment: Faces Faces Pain Scale: Hurts little more Pain Location: R calf, chest Pain Descriptors / Indicators: Grimacing Pain Intervention(s): Monitored during session    Home Living Family/patient expects to be discharged to:: Private residence Living Arrangements: Other relatives (brother, nephews) Available Help at Discharge: Family;Available PRN/intermittently Type of Home: House Home Access: Level entry     Alternate Level Stairs-Number of Steps: 12 Home Layout: Two level Home Equipment: Agricultural consultant (2 wheels)      Prior Function Prior Level of Function : Independent/Modified Independent;Driving             Mobility Comments: ambulatory without DME       Extremity/Trunk Assessment   Upper Extremity Assessment Upper Extremity Assessment: Overall WFL for tasks assessed    Lower Extremity Assessment Lower Extremity Assessment: Overall WFL for tasks assessed (pt with  RLE edema due to DVT)    Cervical / Trunk Assessment Cervical / Trunk Assessment: Normal  Communication   Communication Communication: No apparent difficulties    Cognition Arousal: Alert Behavior During Therapy: WFL for tasks assessed/performed   PT - Cognitive impairments: No apparent impairments                          Following commands: Intact       Cueing Cueing Techniques: Verbal cues     General Comments General comments (skin integrity, edema, etc.): pt on RA upon PT arrival with sats in mid 90s. With ambulation the pt desaturates to 86%, increased work of breathing noted by PT throughout ambulation. Pt requires ~2 minutes to recover to 91% on room air when seated and resting. HR peak at 114 when ambulating.    Exercises     Assessment/Plan    PT Assessment Patient needs continued PT services  PT Problem List Decreased activity tolerance;Decreased mobility;Cardiopulmonary status limiting activity       PT Treatment Interventions Stair training;Gait training;Therapeutic activities;Therapeutic exercise;Functional mobility training;Patient/family education    PT Goals (Current goals can be found in the Care Plan section)  Acute Rehab PT Goals Patient Stated Goal: to improve activity tolerance PT Goal Formulation: With patient Time For Goal Achievement: 08/09/23 Potential to Achieve Goals: Good Additional Goals Additional Goal #1: Pt will report 1/4 DOE or less when ambulating >500' to demonstrate improved tolerance for community mobility    Frequency Min 2X/week     Co-evaluation               AM-PAC PT "6 Clicks" Mobility  Outcome Measure Help needed turning from your back to your side while in a flat bed without using bedrails?: None Help needed moving from lying on your back to sitting on the side of a flat bed without using bedrails?: None Help needed moving to and from a bed to a chair (including a wheelchair)?: None Help needed standing up from a chair using your arms (e.g., wheelchair or bedside chair)?: None Help needed to walk in hospital room?: None Help needed climbing 3-5 steps with a railing? : A Little 6 Click Score: 23    End of Session   Activity Tolerance: Patient tolerated treatment well (DOE with mobility) Patient left:  in bed;with call bell/phone within reach Nurse Communication: Mobility status PT Visit Diagnosis: Other abnormalities of gait and mobility (R26.89)    Time: 1610-9604 PT Time Calculation (min) (ACUTE ONLY): 12 min   Charges:   PT Evaluation $PT Eval Low Complexity: 1 Low   PT General Charges $$ ACUTE PT VISIT: 1 Visit         Arlyss Gandy, PT, DPT Acute Rehabilitation Office [phone]   Arlyss Gandy 07/26/2023, 3:28 PM

## 2023-07-27 ENCOUNTER — Other Ambulatory Visit (HOSPITAL_COMMUNITY): Payer: Self-pay

## 2023-07-27 DIAGNOSIS — I2699 Other pulmonary embolism without acute cor pulmonale: Secondary | ICD-10-CM | POA: Diagnosis not present

## 2023-07-27 LAB — CBC
HCT: 42.7 % (ref 36.0–46.0)
Hemoglobin: 14.1 g/dL (ref 12.0–15.0)
MCH: 32.1 pg (ref 26.0–34.0)
MCHC: 33 g/dL (ref 30.0–36.0)
MCV: 97.3 fL (ref 80.0–100.0)
Platelets: 321 10*3/uL (ref 150–400)
RBC: 4.39 MIL/uL (ref 3.87–5.11)
RDW: 13.5 % (ref 11.5–15.5)
WBC: 6.9 10*3/uL (ref 4.0–10.5)
nRBC: 0 % (ref 0.0–0.2)

## 2023-07-27 LAB — BASIC METABOLIC PANEL
Anion gap: 5 (ref 5–15)
BUN: 31 mg/dL — ABNORMAL HIGH (ref 8–23)
CO2: 22 mmol/L (ref 22–32)
Calcium: 8 mg/dL — ABNORMAL LOW (ref 8.9–10.3)
Chloride: 110 mmol/L (ref 98–111)
Creatinine, Ser: 1.26 mg/dL — ABNORMAL HIGH (ref 0.44–1.00)
GFR, Estimated: 47 mL/min — ABNORMAL LOW (ref >60)
Glucose, Bld: 93 mg/dL (ref 70–99)
Potassium: 4.1 mmol/L (ref 3.5–5.1)
Sodium: 137 mmol/L (ref 135–145)

## 2023-07-27 MED ORDER — POTASSIUM CHLORIDE CRYS ER 20 MEQ PO TBCR: 40.0000 meq | EXTENDED_RELEASE_TABLET | Freq: Once | ORAL | Status: DC

## 2023-07-27 MED ORDER — PANTOPRAZOLE SODIUM 40 MG PO TBEC
40.0000 mg | DELAYED_RELEASE_TABLET | Freq: Every day | ORAL | 0 refills | Status: DC
  Filled 2023-07-27: qty 30, 30d supply, fill #0

## 2023-07-27 MED ORDER — APIXABAN (ELIQUIS) VTE STARTER PACK (10MG AND 5MG)
ORAL_TABLET | ORAL | 0 refills | Status: DC
  Filled 2023-07-27: qty 74, 30d supply, fill #0

## 2023-07-27 MED ORDER — ACETAMINOPHEN 325 MG PO TABS: 650.0000 mg | ORAL_TABLET | Freq: Four times a day (QID) | ORAL | Status: DC | PRN

## 2023-07-27 MED ORDER — OXYCODONE HCL 5 MG PO TABS
5.0000 mg | ORAL_TABLET | Freq: Four times a day (QID) | ORAL | 0 refills | Status: AC | PRN
  Filled 2023-07-27: qty 24, 6d supply, fill #0

## 2023-07-27 NOTE — Progress Notes (Signed)
 Reviewed AVS, patient expressed understanding of medications, MD follow up reviewed.   Removed IV, Site clean, dry and intact.  Patient states all belongings brought to the hospital at time of admission are accounted for and packed to take home.  In formed to up medications from [facility] pharmacy on the way to discharge lounge.  Pt transported to Discharge lounge to wait for transportation home.

## 2023-07-27 NOTE — Discharge Summary (Signed)
 Physician Discharge Summary  Betty Zamora ZOX:[id number] DOB: 29-Jul-1955 DOA: 07/22/2023  PCP: Assunta Found, MD  Admit date: 07/22/2023 Discharge date: 07/27/2023  Admitted From: (Home) Disposition:  (Home )  Recommendations for Outpatient Follow-up:  Follow up with PCP in 1-2 weeks Please obtain BMP/CBC in one week Bilateral referral has been sent to hematology, patient to be followed by pulmonary, they will arrange for outpatient follow-up   Diet recommendation: Heart Healthy  Brief/Interim Summary:   Betty Zamora is a 68 y.o. female with medical history significant of hypertension, vitamin D deficiency, GERD and chronic kidney disease; who presented to the hospital secondary to chest pain.  Patient reports approximately 2-3 days of pleuritic chest discomfort for with associated shortness of breath with activity.  Patient reports worsening pain with deep inspiration, movements and any coughing spell.     On further questioning she has noticed increased leg swelling bilaterally for over weeks now.   Patient expressed no dysuria, no hematuria, no melena, no hematochezia, no focal weaknesses, no hemoptysis, headaches, blurred vision, fever/chills or any sick contacts.   Workup in the ED demonstrating mildly elevated troponin, elevated D-dimer and CT angiogram with large pulmonary embolism and right heart strain.  EKG normal, without acute ischemic changes.  Case discussed with PCCM (Dr. Vassie Loll) with recommendations to treat with IV heparin and transferred to [facility] in case she required the need for thrombolysis therapy.  He was admitted to progressive care at [facility], please see discussion below.      acute pulmonary embolism Pulmonary infarction Acute right lower extremity DVT -No provoking factor, she is have an active job, reactive and ambulatory, no history of recent travel -No family history of VTE's -No oxygen requirement currently during  hospital stay, ambulated before discharge, remains on room air. -Stable blood pressure -Troponins are elevated but downtrending -2D echo with a preserved EF, and no concern of right heart strain. -On heparin gtt., transition to Eliquis. -Doppler significant for acute right lower extremity DVT. -Significant right pleuritic chest pain, related to her pulmonary infarction, she was encouraged with incentive spirometry, flutter valve, she is on pain regimen as well. -No provoking factor, CT abdomen pelvis has been obtained, no evidence or concern for malignancy  -Was encouraged use incentive spirometry, flutter valve, report her pleuritic right-sided chest pain has been improving but still present, she is on as needed pain regimen, with continued encouragement to use incentive spirometry and flutter valve not to develop atelectasis.   -Will need repeat CT chest in the future regarding pulmonary infarction, to follow-up with PCCM as an outpatient.    Hypertension -Resume home antihypertensive agents    chronic kidney disease stage IIIb -Most recent renal function for over a year ago -Patient denies any urinary retention or complaints of any urinary symptoms at the moment -Electrolytes are stable -Minimize nephrotoxic agents   Hypokalemia - replaced   GERD -Continue PPI.   history of vitamin D deficiency -Resume weekly high-dose vitamin D supplementation at discharge.   elevated troponins -Appears to be secondary to pulmonary embolism -EKG without acute ischemic changes -The echo is reassuring       Discharge Diagnoses:  Principal Problem:   Pulmonary embolism (HCC) Active Problems:   Essential hypertension, benign    Discharge Instructions  Discharge Instructions     Diet - low sodium heart healthy   Complete by: As directed    Discharge instructions   Complete by: As directed    Follow  with Primary MD Assunta Found, MD in 7 days   Get CBC, CMP, 2 view Chest X ray  checked  by Primary MD next visit.    Activity: As tolerated with Full fall precautions use walker/cane & assistance as needed   Disposition Home    Diet: Heart Healthy    On your next visit with your primary care physician please Get Medicines reviewed and adjusted.   Please request your Prim.MD to go over all Hospital Tests and Procedure/Radiological results at the follow up, please get all Hospital records sent to your Prim MD by signing hospital release before you go home.   If you experience worsening of your admission symptoms, develop shortness of breath, life threatening emergency, suicidal or homicidal thoughts you must seek medical attention immediately by calling 911 or calling your MD immediately  if symptoms less severe.  You Must read complete instructions/literature along with all the possible adverse reactions/side effects for all the Medicines you take and that have been prescribed to you. Take any new Medicines after you have completely understood and accpet all the possible adverse reactions/side effects.   Do not drive, operating heavy machinery, perform activities at heights, swimming or participation in water activities or provide baby sitting services if your were admitted for syncope or siezures until you have seen by Primary MD or a Neurologist and advised to do so again.  Do not drive when taking Pain medications.    Do not take more than prescribed Pain, Sleep and Anxiety Medications  Special Instructions: If you have smoked or chewed Tobacco  in the last 2 yrs please stop smoking, stop any regular Alcohol  and or any Recreational drug use.  Wear Seat belts while driving.   Please note  You were cared for by a hospitalist during your hospital stay. If you have any questions about your discharge medications or the care you received while you were in the hospital after you are discharged, you can call the unit and asked to speak with the hospitalist on  call if the hospitalist that took care of you is not available. Once you are discharged, your primary care physician will handle any further medical issues. Please note that NO REFILLS for any discharge medications will be authorized once you are discharged, as it is imperative that you return to your primary care physician (or establish a relationship with a primary care physician if you do not have one) for your aftercare needs so that they can reassess your need for medications and monitor your lab values.   Increase activity slowly   Complete by: As directed       Allergies as of 07/27/2023   No Known Allergies      Medication List     TAKE these medications    acetaminophen 325 MG tablet Commonly known as: TYLENOL Take 2 tablets (650 mg total) by mouth every 6 (six) hours as needed for mild pain (pain score 1-3) (or Fever >/= 101).   Apixaban Starter Pack (10mg  and 5mg ) Commonly known as: ELIQUIS STARTER PACK Take as directed on package: start with two-5mg  tablets twice daily for 5 days. On day 6, switch to one-5mg  tablet twice daily on 07/31/2023.   olmesartan 40 MG tablet Commonly known as: BENICAR Take 40 mg by mouth daily.   oxyCODONE 5 MG immediate release tablet Commonly known as: Oxy IR/ROXICODONE Take 1 tablet (5 mg total) by mouth every 6 (six) hours as needed for up to 6 days  for moderate pain (pain score 4-6).   pantoprazole 40 MG tablet Commonly known as: PROTONIX Take 1 tablet (40 mg total) by mouth daily.               Durable Medical Equipment  (From admission, onward)           Start     Ordered   07/27/23 0852  For home use only DME oxygen  Once       Question Answer Comment  Length of Need 6 Months   Mode or (Route) Nasal cannula   Liters per Minute 2   Frequency Continuous (stationary and portable oxygen unit needed)   Oxygen delivery system Gas      07/27/23 0851            Follow-up Information     Assunta Found, MD Follow  up.   Specialty: Family Medicine Contact information: [address] [place] [address] [phone]                No Known Allergies  Consultations: PCCM  Procedures/Studies: DG Chest Port 1 View Result Date: 07/24/2023 CLINICAL DATA:  Worsening shortness of breath.  Pulmonary embolism. EXAM: PORTABLE CHEST 1 VIEW COMPARISON:  07/22/2023 FINDINGS: The heart size and mediastinal contours are within normal limits. Increased right lower lobe infiltrate and small right pleural effusion since prior exam. Left lung remains clear. Several old left rib fracture deformities again seen. IMPRESSION: Increased right lower lobe infiltrate and small right pleural effusion. Electronically Signed   By: Danae Orleans M.D.   On: 07/24/2023 10:46   CT ABDOMEN PELVIS WO CONTRAST Result Date: 07/24/2023 CLINICAL DATA:  Unprovoked DVT and pulmonary embolus. To assess for malignancy. EXAM: CT ABDOMEN AND PELVIS WITHOUT CONTRAST TECHNIQUE: Multidetector CT imaging of the abdomen and pelvis was performed following the standard protocol without IV contrast. RADIATION DOSE REDUCTION: This exam was performed according to the departmental dose-optimization program which includes automated exposure control, adjustment of the mA and/or kV according to patient size and/or use of iterative reconstruction technique. COMPARISON:  CT abdomen and pelvis 05/03/2019.  CT chest 07/22/2023 FINDINGS: Lower chest: Bilateral pleural effusions and atelectasis or consolidation. See prior report of CT chest.Old rib fractures. Hepatobiliary: No focal liver abnormality is seen. No gallstones, gallbladder wall thickening, or biliary dilatation. Pancreas: Unremarkable. No pancreatic ductal dilatation or surrounding inflammatory changes. Spleen: Normal in size without focal abnormality. Adrenals/Urinary Tract: Adrenal glands are unremarkable. Kidneys are normal, without renal calculi, focal lesion, or hydronephrosis. Bladder is  unremarkable. Stomach/Bowel: Stomach is within normal limits. Appendix appears normal. No evidence of bowel wall thickening, distention, or inflammatory changes. Vascular/Lymphatic: Aortic atherosclerosis. No enlarged abdominal or pelvic lymph nodes. Reproductive: Status post hysterectomy. No adnexal masses. Other: No abdominal wall hernia or abnormality. No abdominopelvic ascites. Diffuse edema in the subcutaneous fat. Musculoskeletal: Degenerative changes in the spine. No acute or significant osseous findings. IMPRESSION: 1. Bilateral pleural effusions and basilar consolidation, greater on the right. 2. No primary carcinoma seen. Note that contrast enhanced CT would be more sensitive for evaluation of the solid organs. Electronically Signed   By: Burman Nieves M.D.   On: 07/24/2023 02:52   ECHOCARDIOGRAM COMPLETE Result Date: 07/23/2023    ECHOCARDIOGRAM REPORT   Patient Name:   AZRAEL HUSS Date of Exam: 07/23/2023 Medical Rec #:  [id number]           Height:       64.0 in Accession #:    [id number]  Weight:       154.0 lb Date of Birth:  July 24, 1955            BSA:          1.751 m Patient Age:    68 years            BP:           119/77 mmHg Patient Gender: F                   HR:           94 bpm. Exam Location:  Inpatient Procedure: 2D Echo, Cardiac Doppler and Color Doppler (Both Spectral and Color            Flow Doppler were utilized during procedure). Indications:    Stroke I63.9  History:        Patient has no prior history of Echocardiogram examinations.                 Risk Factors:Hypertension.  Sonographer:    Lucendia Herrlich RCS Referring Phys: [phone] CARLOS MADERA IMPRESSIONS  1. Left ventricular ejection fraction, by estimation, is 65 to 70%. Left ventricular ejection fraction by PLAX is 67 %. The left ventricle has normal function. The left ventricle has no regional wall motion abnormalities. There is moderate asymmetric left ventricular hypertrophy of the basal-septal segment.  Left ventricular diastolic parameters are consistent with Grade I diastolic dysfunction (impaired relaxation).  2. Right ventricular systolic function is normal. The right ventricular size is normal. There is normal pulmonary artery systolic pressure. The estimated right ventricular systolic pressure is 25.8 mmHg.  3. Left atrial size was mildly dilated.  4. The mitral valve is grossly normal. Trivial mitral valve regurgitation.  5. The aortic valve is tricuspid. Aortic valve regurgitation is trivial.  6. Aortic dilatation noted. There is borderline dilatation of the aortic root, measuring 38 mm. There is mild dilatation of the ascending aorta, measuring 41 mm.  7. The inferior vena cava is normal in size with greater than 50% respiratory variability, suggesting right atrial pressure of 3 mmHg. Comparison(s): No prior Echocardiogram. FINDINGS  Left Ventricle: Left ventricular ejection fraction, by estimation, is 65 to 70%. Left ventricular ejection fraction by PLAX is 67 %. The left ventricle has normal function. The left ventricle has no regional wall motion abnormalities. The left ventricular internal cavity size was normal in size. There is moderate asymmetric left ventricular hypertrophy of the basal-septal segment. Left ventricular diastolic parameters are consistent with Grade I diastolic dysfunction (impaired relaxation). Indeterminate filling pressures. Right Ventricle: The right ventricular size is normal. No increase in right ventricular wall thickness. Right ventricular systolic function is normal. There is normal pulmonary artery systolic pressure. The tricuspid regurgitant velocity is 2.39 m/s, and  with an assumed right atrial pressure of 3 mmHg, the estimated right ventricular systolic pressure is 25.8 mmHg. Left Atrium: Left atrial size was mildly dilated. Right Atrium: Right atrial size was normal in size. Pericardium: There is no evidence of pericardial effusion. Mitral Valve: The mitral valve is  grossly normal. Trivial mitral valve regurgitation. Tricuspid Valve: The tricuspid valve is grossly normal. Tricuspid valve regurgitation is trivial. Aortic Valve: The aortic valve is tricuspid. Aortic valve regurgitation is trivial. Aortic regurgitation PHT measures 329 msec. Aortic valve peak gradient measures 5.9 mmHg. Pulmonic Valve: The pulmonic valve was grossly normal. Pulmonic valve regurgitation is trivial. Aorta: Aortic dilatation noted. There is borderline dilatation of the aortic root, measuring 38  mm. There is mild dilatation of the ascending aorta, measuring 41 mm. Venous: The inferior vena cava is normal in size with greater than 50% respiratory variability, suggesting right atrial pressure of 3 mmHg. IAS/Shunts: The interatrial septum is aneurysmal. No atrial level shunt detected by color flow Doppler.  LEFT VENTRICLE PLAX 2D LV EF:         Left            Diastology                ventricular     LV e' medial:    6.09 cm/s                ejection        LV E/e' medial:  10.2                fraction by     LV e' lateral:   8.16 cm/s                PLAX is 67      LV E/e' lateral: 7.6                %. LVIDd:         4.10 cm LVIDs:         2.60 cm LV PW:         1.10 cm LV IVS:        1.00 cm LVOT diam:     2.20 cm LV SV:         47 LV SV Index:   27 LVOT Area:     3.80 cm  RIGHT VENTRICLE             IVC RV S prime:     12.70 cm/s  IVC diam: 1.70 cm TAPSE (M-mode): 1.3 cm LEFT ATRIUM             Index        RIGHT ATRIUM           Index LA diam:        3.70 cm 2.11 cm/m   RA Area:     11.50 cm LA Vol (A2C):   44.0 ml 25.13 ml/m  RA Volume:   23.80 ml  13.59 ml/m LA Vol (A4C):   38.3 ml 21.88 ml/m LA Biplane Vol: 39.1 ml 22.33 ml/m  AORTIC VALVE AV Area (Vmax): 2.67 cm AV Vmax:        121.00 cm/s AV Peak Grad:   5.9 mmHg LVOT Vmax:      85.00 cm/s LVOT Vmean:     52.500 cm/s LVOT VTI:       0.124 m AI PHT:         329 msec  AORTA Ao Root diam: 3.80 cm Ao Asc diam:  4.10 cm MITRAL VALVE                TRICUSPID VALVE MV Area (PHT): 4.41 cm    TR Peak grad:   22.8 mmHg MV Decel Time: 172 msec    TR Vmax:        239.00 cm/s MV E velocity: 62.30 cm/s MV A velocity: 75.30 cm/s  SHUNTS MV E/A ratio:  0.83        Systemic VTI:  0.12 m                            Systemic Diam: 2.20 cm  Zoila Shutter MD Electronically signed by Zoila Shutter MD Signature Date/Time: 07/23/2023/2:28:23 PM    Final    US Venous Img Lower Bilateral (DVT) Result Date: 07/22/2023 CLINICAL DATA:  141700 Pulmonary embolism (HCC) 141700. Bilateral lower extremity swelling. Shortness of breath. EXAM: BILATERAL LOWER EXTREMITY VENOUS DOPPLER ULTRASOUND TECHNIQUE: Gray-scale sonography with compression, as well as color and duplex ultrasound, were performed to evaluate the deep venous system(s) from the level of the common femoral vein through the popliteal and proximal calf veins. COMPARISON:  None Available. FINDINGS: VENOUS Right lower extremity: Normal compressibility of the common femoral, profunda femoral and superficial femoral veins. There is thrombosis of the popliteal vein and 1 of the 2 posterior tibial vein. There is also thrombosis of the great saphenous vein at the saphenofemoral junction region. Left lower extremity: Normal compressibility of the common femoral, superficial femoral, and popliteal veins, as well as the visualized calf veins. Visualized portions of profunda femoral vein and great saphenous vein unremarkable. No filling defects to suggest DVT on grayscale or color Doppler imaging. Doppler waveforms show normal direction of venous flow, normal respiratory plasticity and response to augmentation. OTHER Bilateral calf subcutaneous edema noted. Limitations: none IMPRESSION: 1. Right lower extremity DVT involving the popliteal and posterior tibial veins. There is also thrombosis of the right great saphenous vein at the saphenofemoral junction region. 2. No left lower extremity DVT. 3. Bilateral calf subcutaneous  edema. Electronically Signed   By: Jules Schick M.D.   On: 07/22/2023 14:50   CT Angio Chest PE W/Cm &/Or Wo Cm Addendum Date: 07/22/2023 ADDENDUM REPORT: 07/22/2023 11:28 ADDENDUM: Critical Value/emergent results were called by telephone at the time of interpretation on 07/22/2023 at 11:27 am to provider [facility] , who verbally acknowledged these results. Electronically Signed   By: Jules Schick M.D.   On: 07/22/2023 11:28   Result Date: 07/22/2023 CLINICAL DATA:  Pulmonary embolism (PE) suspected, low to intermediate prob, positive D-dimer. Positional chest pain for 2 days. EXAM: CT ANGIOGRAPHY CHEST WITH CONTRAST TECHNIQUE: Multidetector CT imaging of the chest was performed using the standard protocol during bolus administration of intravenous contrast. Multiplanar CT image reconstructions and MIPs were obtained to evaluate the vascular anatomy. RADIATION DOSE REDUCTION: This exam was performed according to the departmental dose-optimization program which includes automated exposure control, adjustment of the mA and/or kV according to patient size and/or use of iterative reconstruction technique. CONTRAST:  60mL OMNIPAQUE IOHEXOL 350 MG/ML SOLN COMPARISON:  CT angiography chest from 10/25/2004. FINDINGS: Cardiovascular: There is moderate to large volume acute pulmonary embolism. On the left, there is nonocclusive thrombus in the left main pulmonary artery with extension into the lobar, segmental and proximal subsegmental pulmonary artery branches which is nonocclusive. On the right, there is also nonocclusive thrombus in the right main pulmonary artery with extension into the right upper lobe segmental pulmonary artery cough which is nonocclusive. However, there is occlusive thrombus in the middle lobe lobar pulmonary artery and there is nonocclusive thrombus in the right lung lower lobe lobar pulmonary artery with extension into the segmental and proximal subsegmental pulmonary arteries. There is  dilation of the main pulmonary trunk measuring up to 3.4 cm, which is nonspecific but can be seen with pulmonary artery hypertension. There is flattening of the interventricular septum, concerning for right heart strain. There is linear opacity with associated volume loss in the middle lobe, which does not exhibit enhancement of the parenchyma and is concerning for lung infarction. Normal cardiac size. No pericardial effusion. No  aortic aneurysm. Mediastinum/Nodes: Visualized thyroid gland appears grossly unremarkable. No solid / cystic mediastinal masses. The esophagus is nondistended precluding optimal assessment. No axillary, mediastinal or hilar lymphadenopathy by size criteria. Lungs/Pleura: The central tracheo-bronchial tree is patent. There is small right pleural effusion. There are patchy atelectatic changes in the right upper lobe and right lower lobe. There are focal ground-glass opacities in the right lung lower lobe inferiorly, which are nonspecific and may represent sequela of infection or inflammation. No suspicious lung mass. No left pleural effusion. No pneumothorax on either side. No suspicious lung nodule. The Upper Abdomen: There is a subcapsular 5 mm sized calcification in the right hepatic lobe, favored to represent calcified granuloma. There is a 2.1 x 2.1 cm simple cyst in the right kidney. Remaining visualized upper abdominal viscera within normal limits. Musculoskeletal: The visualized soft tissues of the chest wall are grossly unremarkable. No suspicious osseous lesions. There are mild to moderate multilevel degenerative changes in the visualized spine. Multiple old healed right-sided rib fractures noted. Review of the MIP images confirms the above findings. IMPRESSION: 1. There is moderate to large volume acute pulmonary embolism with CT evidence of right heart strain. There is nonocclusive thrombus in the left main, right upper lobe and right lower lobe lobar pulmonary arteries. There is  occlusive thrombus in the middle lobe lobar pulmonary artery. 2. There is a linear opacity with associated volume loss in the middle lobe, which does not exhibit enhancement of the parenchyma and is concerning for lung infarction. 3. Multiple other nonacute observations, as described above. 4. There are focal ground-glass opacities in the right lung lower lobe inferiorly, which are nonspecific and may represent sequela of infection or inflammation. Electronically Signed: By: Jules Schick M.D. On: 07/22/2023 11:20   DG Chest Port 1 View Result Date: 07/22/2023 CLINICAL DATA:  Chest pain. EXAM: PORTABLE CHEST 1 VIEW COMPARISON:  10/19/2008. FINDINGS: Focal areas of atelectasis/scarring noted overlying the right lung base. There is associated mild to moderate elevation of right hemidiaphragm. Bilateral lung fields are otherwise clear. No dense consolidation. Bilateral costophrenic angles are clear. No pneumothorax. Normal cardio-mediastinal silhouette. No acute osseous abnormalities. The soft tissues are within normal limits. IMPRESSION: *No active disease. Electronically Signed   By: Jules Schick M.D.   On: 07/22/2023 09:46   US ARTERIAL ABI (SCREENING LOWER EXTREMITY) Result Date: 07/21/2023 CLINICAL DATA:  Venous insufficiency, bilateral lower extremity edema EXAM: NONINVASIVE PHYSIOLOGIC VASCULAR STUDY OF BILATERAL LOWER EXTREMITIES TECHNIQUE: Evaluation of both lower extremities were performed at rest, including calculation of ankle-brachial indices with single level Doppler, pressure and pulse volume recording. COMPARISON:  None Available. FINDINGS: Right ABI:  1.1 Left ABI:  1.1 Right Lower Extremity:  Normal arterial waveforms at the ankle. Left Lower Extremity:  Normal arterial waveforms at the ankle. 1.0-1.4 Normal IMPRESSION: Normal bilateral resting ankle-brachial indices. Electronically Signed   By: Malachy Moan M.D.   On: 07/21/2023 07:23      Subjective:  Overall reports she is  feeling better, no hypoxia when she ambulated in the hallway, still reports pleuritic chest pain, right-sided, but it is improving, she was instructed to take her incentive spirometer, flutter valve at home with her  Discharge Exam: Vitals:   07/27/23 0630 07/27/23 1049  BP:    Pulse: 83   Resp: 19 16  Temp:  97.8 F (36.6 C)  SpO2: 93% 95%   Vitals:   07/27/23 0437 07/27/23 0500 07/27/23 0630 07/27/23 1049  BP: (!) 150/83  Pulse: 87 83 83   Resp: 20 16 19 16   Temp:    97.8 F (36.6 C)  TempSrc:    Oral  SpO2: 93% 93% 93% 95%  Weight:  74 kg    Height:        General: Pt is alert, awake, not in acute distress Cardiovascular: RRR, S1/S2 +, no rubs, no gallops Respiratory: CTA bilaterally, overall good air entry, improved at right lung base this morning. Abdominal: Soft, NT, ND, bowel sounds + Extremities: no edema, no cyanosis    The results of significant diagnostics from this hospitalization (including imaging, microbiology, ancillary and laboratory) are listed below for reference.     Microbiology: Recent Results (from the past 240 hours)  MRSA Next Gen by PCR, Nasal     Status: None   Collection Time: 07/23/23  4:12 PM   Specimen: Nasal Mucosa; Nasal Swab  Result Value Ref Range Status   MRSA by PCR Next Gen NOT DETECTED NOT DETECTED Final    Comment: (NOTE) The GeneXpert MRSA Assay (FDA approved for NASAL specimens only), is one component of a comprehensive MRSA colonization surveillance program. It is not intended to diagnose MRSA infection nor to guide or monitor treatment for MRSA infections. Test performance is not FDA approved in patients less than 71 years old. Performed at [facility], [address] [place], [place] [address]      Labs: BNP (last 3 results) Recent Labs    07/22/23 0933 07/24/23 0542 07/26/23 0508  BNP 210.0* 123.6* 123.9*   Basic Metabolic Panel: Recent Labs  Lab 07/22/23 1112 07/23/23 0924 07/24/23 0542  07/25/23 0551 07/26/23 0508 07/27/23 0448  NA  --  137 138 136 137 137  K  --  3.3* 4.3 4.4 4.2 4.1  CL  --  110 110 110 111 110  CO2  --  21* 23 19* 21* 22  GLUCOSE  --  136* 98 93 113* 93  BUN  --  30* 27* 28* 32* 31*  CREATININE  --  1.31* 1.17* 1.34* 1.47* 1.26*  CALCIUM  --  8.0* 8.0* 7.8* 7.9* 8.0*  MG 2.1  --   --   --   --   --    Liver Function Tests: No results for input(s): "AST", "ALT", "ALKPHOS", "BILITOT", "PROT", "ALBUMIN" in the last 168 hours. No results for input(s): "LIPASE", "AMYLASE" in the last 168 hours. No results for input(s): "AMMONIA" in the last 168 hours. CBC: Recent Labs  Lab 07/23/23 0111 07/24/23 0542 07/25/23 0551 07/26/23 0508 07/27/23 0448  WBC 8.8 8.9 8.8 7.9 6.9  HGB 15.1* 14.3 14.0 14.3 14.1  HCT 45.0 42.5 41.2 43.9 42.7  MCV 96.6 97.5 96.0 97.8 97.3  PLT 254 254 277 293 321   Cardiac Enzymes: No results for input(s): "CKTOTAL", "CKMB", "CKMBINDEX", "TROPONINI" in the last 168 hours. BNP: Invalid input(s): "POCBNP" CBG: Recent Labs  Lab 07/25/23 0817  GLUCAP 88   D-Dimer No results for input(s): "DDIMER" in the last 72 hours. Hgb A1c No results for input(s): "HGBA1C" in the last 72 hours. Lipid Profile No results for input(s): "CHOL", "HDL", "LDLCALC", "TRIG", "CHOLHDL", "LDLDIRECT" in the last 72 hours. Thyroid function studies No results for input(s): "TSH", "T4TOTAL", "T3FREE", "THYROIDAB" in the last 72 hours.  Invalid input(s): "FREET3" Anemia work up No results for input(s): "VITAMINB12", "FOLATE", "FERRITIN", "TIBC", "IRON", "RETICCTPCT" in the last 72 hours. Urinalysis No results found for: "COLORURINE", "APPEARANCEUR", "LABSPEC", "PHURINE", "GLUCOSEU", "HGBUR", "BILIRUBINUR", "KETONESUR", "  PROTEINUR", "UROBILINOGEN", "NITRITE", "LEUKOCYTESUR" Sepsis Labs Recent Labs  Lab 07/24/23 0542 07/25/23 0551 07/26/23 0508 07/27/23 0448  WBC 8.9 8.8 7.9 6.9   Microbiology Recent Results (from the past 240 hours)   MRSA Next Gen by PCR, Nasal     Status: None   Collection Time: 07/23/23  4:12 PM   Specimen: Nasal Mucosa; Nasal Swab  Result Value Ref Range Status   MRSA by PCR Next Gen NOT DETECTED NOT DETECTED Final    Comment: (NOTE) The GeneXpert MRSA Assay (FDA approved for NASAL specimens only), is one component of a comprehensive MRSA colonization surveillance program. It is not intended to diagnose MRSA infection nor to guide or monitor treatment for MRSA infections. Test performance is not FDA approved in patients less than 62 years old. Performed at [facility], [address], [place], [place] [address]      Time coordinating discharge: Over 30 minutes  SIGNED:   Huey Bienenstock, MD  [facility] 07/27/2023, 11:00 AM Pager   If 7PM-7AM, please contact night-coverage [email or web address] Password [email or web address]

## 2023-07-27 NOTE — Plan of Care (Signed)
 Plan of care is reviewed. Pt has been progressing. She is stable hemodynamically, afebrile, on room air, no SOB with exertions, , SPO2 92-94%, occasionally has complaints of mid sternal chest pain when doing deep breathing. Pain is well tolerated with Oxycodone 5 mg PRN q 6 hrs. She is able to rest well overnight. Effectively using incentive spirometer is encouraged. Pt is able to tolerate doing the breathing exercise. She is able to ambulate independently in her room. No acute distress note tonight.  Problem: Education: Goal: Knowledge of General Education information will improve Description: Including pain rating scale, medication(s)/side effects and non-pharmacologic comfort measures Outcome: Progressing   Problem: Health Behavior/Discharge Planning: Goal: Ability to manage health-related needs will improve Outcome: Progressing   Problem: Clinical Measurements: Goal: Ability to maintain clinical measurements within normal limits will improve Outcome: Progressing Goal: Will remain free from infection Outcome: Progressing Goal: Diagnostic test results will improve Outcome: Progressing Goal: Respiratory complications will improve Outcome: Progressing Goal: Cardiovascular complication will be avoided Outcome: Progressing   Problem: Activity: Goal: Risk for activity intolerance will decrease Outcome: Progressing   Problem: Nutrition: Goal: Adequate nutrition will be maintained Outcome: Progressing   Problem: Elimination: Goal: Will not experience complications related to bowel motility Outcome: Progressing Goal: Will not experience complications related to urinary retention Outcome: Progressing   Problem: Pain Managment: Goal: General experience of comfort will improve and/or be controlled Outcome: Progressing   Problem: Safety: Goal: Ability to remain free from injury will improve Outcome: Progressing   Problem: Skin Integrity: Goal: Risk for impaired skin integrity will  decrease Outcome: Progressing   Filiberto Pinks, RN

## 2023-07-27 NOTE — Progress Notes (Signed)
 SATURATION QUALIFICATIONS: (This note is used to comply with regulatory documentation for home oxygen)  Patient Saturations on Room Air at Rest = 93%  Patient Saturations on Room Air while Ambulating = 97%  Patient Saturations on 0 Liters of oxygen while Ambulating = 95%  Please briefly explain why patient needs home oxygen:  Pt does not need oxygen  Balinda Quails, RN 07/27/2023 11:41 AM

## 2023-07-27 NOTE — TOC Transition Note (Signed)
 Transition of Care [facility]) - Discharge Note   Patient Details  Name: Betty Zamora MRN: [id number] Date of Birth: Aug 22, 1955  [facility] CM/SW Contact:  Gordy Clement, RN Phone Number: 07/27/2023, 11:14 AM   Clinical Narrative:     Patient will DC to home today. No therapy recommended . Patient will follow up as directed on AVS. Family to transport            Patient Goals and CMS Choice            Discharge Placement                       Discharge Plan and Services Additional resources added to the After Visit Summary for                                       Social Drivers of Health (SDOH) Interventions SDOH Screenings   Food Insecurity: No Food Insecurity (07/23/2023)  Housing: Low Risk  (07/23/2023)  Transportation Needs: No Transportation Needs (07/23/2023)  Social Connections: Moderately Isolated (07/23/2023)  Tobacco Use: Medium Risk (07/22/2023)     Readmission Risk Interventions     No data to display

## 2023-07-29 ENCOUNTER — Inpatient Hospital Stay

## 2023-07-29 ENCOUNTER — Other Ambulatory Visit (HOSPITAL_COMMUNITY): Payer: Self-pay | Admitting: Oncology

## 2023-07-29 ENCOUNTER — Inpatient Hospital Stay: Attending: Oncology | Admitting: Oncology

## 2023-07-29 VITALS — BP 153/90 | HR 92 | Temp 97.2°F | Resp 19 | Ht 66.0 in | Wt 155.6 lb

## 2023-07-29 DIAGNOSIS — I82811 Embolism and thrombosis of superficial veins of right lower extremities: Secondary | ICD-10-CM | POA: Insufficient documentation

## 2023-07-29 DIAGNOSIS — I824Y1 Acute embolism and thrombosis of unspecified deep veins of right proximal lower extremity: Secondary | ICD-10-CM | POA: Diagnosis not present

## 2023-07-29 DIAGNOSIS — D6862 Lupus anticoagulant syndrome: Secondary | ICD-10-CM | POA: Insufficient documentation

## 2023-07-29 DIAGNOSIS — Z8041 Family history of malignant neoplasm of ovary: Secondary | ICD-10-CM | POA: Diagnosis not present

## 2023-07-29 DIAGNOSIS — Z8051 Family history of malignant neoplasm of kidney: Secondary | ICD-10-CM | POA: Insufficient documentation

## 2023-07-29 DIAGNOSIS — I2699 Other pulmonary embolism without acute cor pulmonale: Secondary | ICD-10-CM | POA: Diagnosis not present

## 2023-07-29 DIAGNOSIS — I129 Hypertensive chronic kidney disease with stage 1 through stage 4 chronic kidney disease, or unspecified chronic kidney disease: Secondary | ICD-10-CM | POA: Insufficient documentation

## 2023-07-29 DIAGNOSIS — N189 Chronic kidney disease, unspecified: Secondary | ICD-10-CM | POA: Insufficient documentation

## 2023-07-29 DIAGNOSIS — Z7901 Long term (current) use of anticoagulants: Secondary | ICD-10-CM | POA: Insufficient documentation

## 2023-07-29 DIAGNOSIS — Z79899 Other long term (current) drug therapy: Secondary | ICD-10-CM | POA: Insufficient documentation

## 2023-07-29 DIAGNOSIS — N1832 Chronic kidney disease, stage 3b: Secondary | ICD-10-CM

## 2023-07-29 DIAGNOSIS — R531 Weakness: Secondary | ICD-10-CM | POA: Diagnosis not present

## 2023-07-29 DIAGNOSIS — R079 Chest pain, unspecified: Secondary | ICD-10-CM | POA: Diagnosis not present

## 2023-07-29 DIAGNOSIS — I8290 Acute embolism and thrombosis of unspecified vein: Secondary | ICD-10-CM | POA: Diagnosis not present

## 2023-07-29 DIAGNOSIS — F17210 Nicotine dependence, cigarettes, uncomplicated: Secondary | ICD-10-CM | POA: Insufficient documentation

## 2023-07-29 DIAGNOSIS — Z1231 Encounter for screening mammogram for malignant neoplasm of breast: Secondary | ICD-10-CM

## 2023-07-29 NOTE — Patient Instructions (Signed)
 VISIT SUMMARY:  During today's visit, we discussed your recent hospitalization for chest pain and swelling in your feet and toes, which was diagnosed as a venous thromboembolism (VTE). We also reviewed your history of smoking, kidney disease, and family history of cancer. We addressed the need for ongoing anticoagulation therapy, smoking cessation, and cancer screenings.  YOUR PLAN:  -VENOUS THROMBOEMBOLISM (VTE): VTE is a condition where blood clots form in the veins, which can be dangerous if they travel to the lungs. You will need to continue your anticoagulation therapy for three months to prevent recurrence. We will perform a D-dimer test at your three-month follow-up to determine if lifelong anticoagulation is necessary. It is important to stop smoking to reduce your risk of future clots.  -CHRONIC KIDNEY DISEASE (CKD): CKD means your kidneys are not functioning as well as they should. We will refer you to a nephrologist in [place] for further evaluation and management.  -CANCER SCREENING: Given your age and family history of cancer, it is important to keep up with cancer screenings. We will order a colonoscopy and a mammogram to ensure everything is up to date.  INSTRUCTIONS:  Please schedule a follow-up appointment in one month for blood tests and evaluation. Make sure to complete your blood work before this appointment. Additionally, we will refer you to a nephrologist in [place] for further evaluation of your kidney function.

## 2023-07-29 NOTE — Progress Notes (Unsigned)
 [facility]  HEMATOLOGY NEW VISIT  Assunta Found, MD  REASON FOR REFERRAL: PE  SUMMARY OF HEMATOLOGIC HISTORY: 07/22/23: 1st episode: -Right leg DVT and PE -No risk factors -On Eliquis 5mg  BID   HISTORY OF PRESENT ILLNESS: Betty Zamora 68 y.o. female referred for newy diagnosed right leg DVT and PE. She is accompanied by her sister today. She has a PMH of HTN.    I have reviewed the past medical history, past surgical history, social history and family history with the patient   ALLERGIES:  has no known allergies.  MEDICATIONS:  Current Outpatient Medications  Medication Sig Dispense Refill   acetaminophen (TYLENOL) 325 MG tablet Take 2 tablets (650 mg total) by mouth every 6 (six) hours as needed for mild pain (pain score 1-3) (or Fever >/= 101).     APIXABAN (ELIQUIS) VTE STARTER PACK (10MG  AND 5MG ) Take as directed on package: start with two-5mg  tablets twice daily for 5 days. On day 6, switch to one-5mg  tablet twice daily on 07/31/2023. 74 each 0   olmesartan (BENICAR) 40 MG tablet Take 40 mg by mouth daily.     oxyCODONE (OXY IR/ROXICODONE) 5 MG immediate release tablet Take 1 tablet (5 mg total) by mouth every 6 (six) hours as needed for up to 6 days for moderate pain (pain score 4-6). 24 tablet 0   pantoprazole (PROTONIX) 40 MG tablet Take 1 tablet (40 mg total) by mouth daily. 30 tablet 0   No current facility-administered medications for this visit.     REVIEW OF SYSTEMS:   Constitutional: Denies fevers, chills or night sweats Eyes: Denies blurriness of vision Ears, nose, mouth, throat, and face: Denies mucositis or sore throat Respiratory: Denies cough, dyspnea or wheezes Cardiovascular: Denies palpitation, chest discomfort or lower extremity swelling Gastrointestinal:  Denies nausea, heartburn or change in bowel habits Skin: Denies abnormal skin rashes Lymphatics: Denies new lymphadenopathy or easy  bruising Neurological:Denies numbness, tingling or new weaknesses Behavioral/Psych: Mood is stable, no new changes  All other systems were reviewed with the patient and are negative.  PHYSICAL EXAMINATION:   There were no vitals filed for this visit.  GENERAL:alert, no distress and comfortable SKIN: skin color, texture, turgor are normal, no rashes or significant lesions EYES: normal, Conjunctiva are pink and non-injected, sclera clear OROPHARYNX:no exudate, no erythema and lips, buccal mucosa, and tongue normal  NECK: supple, thyroid normal size, non-tender, without nodularity LYMPH:  no palpable lymphadenopathy in the cervical, axillary or inguinal LUNGS: clear to auscultation and percussion with normal breathing effort HEART: regular rate & rhythm and no murmurs and no lower extremity edema ABDOMEN:abdomen soft, non-tender and normal bowel sounds Musculoskeletal:no cyanosis of digits and no clubbing  NEURO: alert & oriented x 3 with fluent speech, no focal motor/sensory deficits  LABORATORY DATA:  I have reviewed the data as listed  Lab Results  Component Value Date   WBC 6.9 07/27/2023   NEUTROABS 3.8 11/02/2010   HGB 14.1 07/27/2023   HCT 42.7 07/27/2023   MCV 97.3 07/27/2023   PLT 321 07/27/2023       Chemistry      Component Value Date/Time   NA 137 07/27/2023 0448   NA 143 03/05/2022 0859   K 4.1 07/27/2023 0448   CL 110 07/27/2023 0448   CO2 22 07/27/2023 0448   BUN 31 (H) 07/27/2023 0448   BUN 11 03/05/2022 0859   CREATININE 1.26 (H) 07/27/2023 0448  Component Value Date/Time   CALCIUM 8.0 (L) 07/27/2023 0448   ALKPHOS 88 03/05/2022 0859   AST 29 03/05/2022 0859   ALT 26 03/05/2022 0859   BILITOT 0.8 03/05/2022 0859       RADIOGRAPHIC STUDIES: I have personally reviewed the radiological images as listed and agreed with the findings in the report.  CT angio chest: 07/22/23:  IMPRESSION: 1. There is moderate to large volume acute pulmonary  embolism with CT evidence of right heart strain. There is nonocclusive thrombus in the left main, right upper lobe and right lower lobe lobar pulmonary arteries. There is occlusive thrombus in the middle lobe lobar pulmonary artery. 2. There is a linear opacity with associated volume loss in the middle lobe, which does not exhibit enhancement of the parenchyma and is concerning for lung infarction. 3. Multiple other nonacute observations, as described above. 4. There are focal ground-glass opacities in the right lung lower lobe inferiorly, which are nonspecific and may represent sequela of infection or inflammation.   [place] LE: 07/22/23:  Narrative & Impression   IMPRESSION: 1. Right lower extremity DVT involving the popliteal and posterior tibial veins. There is also thrombosis of the right great saphenous vein at the saphenofemoral junction region. 2. No left lower extremity DVT. 3. Bilateral calf subcutaneous edema.     ASSESSMENT & PLAN:  No problem-specific Assessment & Plan notes found for this encounter.   No orders of the defined types were placed in this encounter.   The total time spent in the appointment was {CHL ONC TIME VISIT - [id number]} encounter with patients including review of chart and various tests results, discussions about plan of care and coordination of care plan   All questions were answered. The patient knows to call the clinic with any problems, questions or concerns. No barriers to learning was detected.   Cindie Crumbly, MD 3/27/20259:44 AM

## 2023-07-30 DIAGNOSIS — N189 Chronic kidney disease, unspecified: Secondary | ICD-10-CM | POA: Insufficient documentation

## 2023-07-30 DIAGNOSIS — I8290 Acute embolism and thrombosis of unspecified vein: Secondary | ICD-10-CM | POA: Insufficient documentation

## 2023-07-30 NOTE — Assessment & Plan Note (Signed)
 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  Patient is a chronic smoker again for past 10 years.  She did not travel recently, has no family history of blood clots.  Patient did have recent procedures and is very active.  Patient is currently on Eliquis. -Continue Eliquis at this time -Will defer thrombophilia testing at 1 month point as acute clot interferes with some of the test results.  Would only test for anticardiolipin antibodies, beta-2 glycoprotein antibodies, PT gene mutation and factor V Leyden as protein C&S levels, lupus anticoagulant are affected by Eliquis use. -Can consider D-dimer testing at a later stage to cut down the dose of Eliquis are discontinue. -Would recommend cancer screening at this time.  Will refer to GI for colonoscopy and will order a mammogram. -Advised smoking cessation to reduce future clot risk.  Return to clinic in 1 month with labs.

## 2023-07-30 NOTE — Assessment & Plan Note (Signed)
 Patient has new onset chronic kidney disease with a GFR of 47.  Patient has a history of hypertension. -Will refer to nephrology for further evaluation and management.

## 2023-08-02 ENCOUNTER — Encounter (INDEPENDENT_AMBULATORY_CARE_PROVIDER_SITE_OTHER): Payer: Self-pay | Admitting: *Deleted

## 2023-08-05 ENCOUNTER — Encounter (HOSPITAL_COMMUNITY): Payer: Self-pay

## 2023-08-05 ENCOUNTER — Inpatient Hospital Stay (HOSPITAL_COMMUNITY)
Admission: EM | Admit: 2023-08-05 | Discharge: 2023-09-02 | DRG: 208 | Disposition: E | Attending: Pulmonary Disease | Admitting: Pulmonary Disease

## 2023-08-05 ENCOUNTER — Emergency Department (HOSPITAL_COMMUNITY)

## 2023-08-05 ENCOUNTER — Other Ambulatory Visit: Payer: Self-pay

## 2023-08-05 DIAGNOSIS — T85628A Displacement of other specified internal prosthetic devices, implants and grafts, initial encounter: Secondary | ICD-10-CM | POA: Diagnosis not present

## 2023-08-05 DIAGNOSIS — I700 Atherosclerosis of aorta: Secondary | ICD-10-CM | POA: Diagnosis not present

## 2023-08-05 DIAGNOSIS — Z87891 Personal history of nicotine dependence: Secondary | ICD-10-CM | POA: Diagnosis not present

## 2023-08-05 DIAGNOSIS — Z86718 Personal history of other venous thrombosis and embolism: Secondary | ICD-10-CM

## 2023-08-05 DIAGNOSIS — I2489 Other forms of acute ischemic heart disease: Secondary | ICD-10-CM | POA: Diagnosis present

## 2023-08-05 DIAGNOSIS — E663 Overweight: Secondary | ICD-10-CM | POA: Diagnosis not present

## 2023-08-05 DIAGNOSIS — Z66 Do not resuscitate: Secondary | ICD-10-CM | POA: Diagnosis not present

## 2023-08-05 DIAGNOSIS — R0989 Other specified symptoms and signs involving the circulatory and respiratory systems: Secondary | ICD-10-CM | POA: Diagnosis not present

## 2023-08-05 DIAGNOSIS — K7200 Acute and subacute hepatic failure without coma: Secondary | ICD-10-CM | POA: Diagnosis not present

## 2023-08-05 DIAGNOSIS — D62 Acute posthemorrhagic anemia: Secondary | ICD-10-CM | POA: Diagnosis not present

## 2023-08-05 DIAGNOSIS — Z7901 Long term (current) use of anticoagulants: Secondary | ICD-10-CM | POA: Diagnosis not present

## 2023-08-05 DIAGNOSIS — R918 Other nonspecific abnormal finding of lung field: Secondary | ICD-10-CM | POA: Diagnosis not present

## 2023-08-05 DIAGNOSIS — G9349 Other encephalopathy: Secondary | ICD-10-CM | POA: Diagnosis present

## 2023-08-05 DIAGNOSIS — J918 Pleural effusion in other conditions classified elsewhere: Secondary | ICD-10-CM | POA: Diagnosis not present

## 2023-08-05 DIAGNOSIS — Z86711 Personal history of pulmonary embolism: Secondary | ICD-10-CM

## 2023-08-05 DIAGNOSIS — D75839 Thrombocytosis, unspecified: Secondary | ICD-10-CM | POA: Diagnosis not present

## 2023-08-05 DIAGNOSIS — Z6826 Body mass index (BMI) 26.0-26.9, adult: Secondary | ICD-10-CM | POA: Diagnosis not present

## 2023-08-05 DIAGNOSIS — R570 Cardiogenic shock: Secondary | ICD-10-CM | POA: Diagnosis not present

## 2023-08-05 DIAGNOSIS — N1832 Chronic kidney disease, stage 3b: Secondary | ICD-10-CM | POA: Diagnosis present

## 2023-08-05 DIAGNOSIS — Z90710 Acquired absence of both cervix and uterus: Secondary | ICD-10-CM

## 2023-08-05 DIAGNOSIS — R579 Shock, unspecified: Secondary | ICD-10-CM | POA: Diagnosis not present

## 2023-08-05 DIAGNOSIS — N189 Chronic kidney disease, unspecified: Secondary | ICD-10-CM | POA: Diagnosis present

## 2023-08-05 DIAGNOSIS — E872 Acidosis, unspecified: Secondary | ICD-10-CM | POA: Diagnosis not present

## 2023-08-05 DIAGNOSIS — K219 Gastro-esophageal reflux disease without esophagitis: Secondary | ICD-10-CM | POA: Diagnosis present

## 2023-08-05 DIAGNOSIS — J9819 Other pulmonary collapse: Secondary | ICD-10-CM | POA: Diagnosis not present

## 2023-08-05 DIAGNOSIS — Z48813 Encounter for surgical aftercare following surgery on the respiratory system: Secondary | ICD-10-CM | POA: Diagnosis not present

## 2023-08-05 DIAGNOSIS — J9601 Acute respiratory failure with hypoxia: Secondary | ICD-10-CM | POA: Diagnosis not present

## 2023-08-05 DIAGNOSIS — J984 Other disorders of lung: Secondary | ICD-10-CM | POA: Diagnosis not present

## 2023-08-05 DIAGNOSIS — I219 Acute myocardial infarction, unspecified: Secondary | ICD-10-CM | POA: Diagnosis not present

## 2023-08-05 DIAGNOSIS — Z452 Encounter for adjustment and management of vascular access device: Secondary | ICD-10-CM | POA: Diagnosis not present

## 2023-08-05 DIAGNOSIS — Z7189 Other specified counseling: Secondary | ICD-10-CM

## 2023-08-05 DIAGNOSIS — S2242XA Multiple fractures of ribs, left side, initial encounter for closed fracture: Secondary | ICD-10-CM | POA: Diagnosis not present

## 2023-08-05 DIAGNOSIS — Z4682 Encounter for fitting and adjustment of non-vascular catheter: Secondary | ICD-10-CM | POA: Diagnosis not present

## 2023-08-05 DIAGNOSIS — I5032 Chronic diastolic (congestive) heart failure: Secondary | ICD-10-CM | POA: Diagnosis present

## 2023-08-05 DIAGNOSIS — N179 Acute kidney failure, unspecified: Secondary | ICD-10-CM | POA: Diagnosis not present

## 2023-08-05 DIAGNOSIS — I10 Essential (primary) hypertension: Secondary | ICD-10-CM | POA: Diagnosis not present

## 2023-08-05 DIAGNOSIS — I517 Cardiomegaly: Secondary | ICD-10-CM | POA: Diagnosis not present

## 2023-08-05 DIAGNOSIS — I130 Hypertensive heart and chronic kidney disease with heart failure and stage 1 through stage 4 chronic kidney disease, or unspecified chronic kidney disease: Secondary | ICD-10-CM | POA: Diagnosis present

## 2023-08-05 DIAGNOSIS — N281 Cyst of kidney, acquired: Secondary | ICD-10-CM | POA: Diagnosis not present

## 2023-08-05 DIAGNOSIS — I2699 Other pulmonary embolism without acute cor pulmonale: Secondary | ICD-10-CM | POA: Diagnosis not present

## 2023-08-05 DIAGNOSIS — R7989 Other specified abnormal findings of blood chemistry: Secondary | ICD-10-CM | POA: Diagnosis not present

## 2023-08-05 DIAGNOSIS — I5030 Unspecified diastolic (congestive) heart failure: Secondary | ICD-10-CM | POA: Diagnosis not present

## 2023-08-05 DIAGNOSIS — J9811 Atelectasis: Secondary | ICD-10-CM | POA: Diagnosis not present

## 2023-08-05 DIAGNOSIS — J90 Pleural effusion, not elsewhere classified: Principal | ICD-10-CM | POA: Diagnosis present

## 2023-08-05 DIAGNOSIS — Y838 Other surgical procedures as the cause of abnormal reaction of the patient, or of later complication, without mention of misadventure at the time of the procedure: Secondary | ICD-10-CM | POA: Diagnosis not present

## 2023-08-05 DIAGNOSIS — E875 Hyperkalemia: Secondary | ICD-10-CM | POA: Diagnosis not present

## 2023-08-05 DIAGNOSIS — R188 Other ascites: Secondary | ICD-10-CM | POA: Diagnosis not present

## 2023-08-05 DIAGNOSIS — R7401 Elevation of levels of liver transaminase levels: Secondary | ICD-10-CM | POA: Diagnosis not present

## 2023-08-05 DIAGNOSIS — K7689 Other specified diseases of liver: Secondary | ICD-10-CM | POA: Diagnosis not present

## 2023-08-05 LAB — CBC WITH DIFFERENTIAL/PLATELET
Abs Immature Granulocytes: 0.02 10*3/uL (ref 0.00–0.07)
Basophils Absolute: 0 10*3/uL (ref 0.0–0.1)
Basophils Relative: 0 %
Eosinophils Absolute: 0 10*3/uL (ref 0.0–0.5)
Eosinophils Relative: 0 %
HCT: 55.7 % — ABNORMAL HIGH (ref 36.0–46.0)
Hemoglobin: 18.1 g/dL — ABNORMAL HIGH (ref 12.0–15.0)
Immature Granulocytes: 0 %
Lymphocytes Relative: 14 %
Lymphs Abs: 1 10*3/uL (ref 0.7–4.0)
MCH: 32.1 pg (ref 26.0–34.0)
MCHC: 32.5 g/dL (ref 30.0–36.0)
MCV: 98.9 fL (ref 80.0–100.0)
Monocytes Absolute: 0.4 10*3/uL (ref 0.1–1.0)
Monocytes Relative: 5 %
Neutro Abs: 5.9 10*3/uL (ref 1.7–7.7)
Neutrophils Relative %: 81 %
Platelets: 519 10*3/uL — ABNORMAL HIGH (ref 150–400)
RBC: 5.63 MIL/uL — ABNORMAL HIGH (ref 3.87–5.11)
RDW: 14.2 % (ref 11.5–15.5)
WBC: 7.4 10*3/uL (ref 4.0–10.5)
nRBC: 0 % (ref 0.0–0.2)

## 2023-08-05 LAB — BASIC METABOLIC PANEL WITH GFR
Anion gap: 9 (ref 5–15)
BUN: 30 mg/dL — ABNORMAL HIGH (ref 8–23)
CO2: 20 mmol/L — ABNORMAL LOW (ref 22–32)
Calcium: 8.9 mg/dL (ref 8.9–10.3)
Chloride: 110 mmol/L (ref 98–111)
Creatinine, Ser: 1.16 mg/dL — ABNORMAL HIGH (ref 0.44–1.00)
GFR, Estimated: 52 mL/min — ABNORMAL LOW (ref >60)
Glucose, Bld: 100 mg/dL — ABNORMAL HIGH (ref 70–99)
Potassium: 5 mmol/L (ref 3.5–5.1)
Sodium: 139 mmol/L (ref 135–145)

## 2023-08-05 LAB — TROPONIN I (HIGH SENSITIVITY): Troponin I (High Sensitivity): 29 ng/L — ABNORMAL HIGH (ref <18)

## 2023-08-05 LAB — BRAIN NATRIURETIC PEPTIDE: B Natriuretic Peptide: 107 pg/mL — ABNORMAL HIGH (ref 0.0–100.0)

## 2023-08-05 MED ORDER — FUROSEMIDE 10 MG/ML IJ SOLN
20.0000 mg | Freq: Once | INTRAMUSCULAR | Status: AC
  Administered 2023-08-05: 20 mg via INTRAVENOUS
  Filled 2023-08-05: qty 2

## 2023-08-05 MED ORDER — HEPARIN (PORCINE) 25000 UT/250ML-% IV SOLN
1800.0000 [IU]/h | INTRAVENOUS | Status: DC
  Administered 2023-08-05: 1100 [IU]/h via INTRAVENOUS
  Administered 2023-08-06 – 2023-08-07 (×2): 1350 [IU]/h via INTRAVENOUS
  Administered 2023-08-09 – 2023-08-11 (×4): 1750 [IU]/h via INTRAVENOUS
  Filled 2023-08-05 (×8): qty 250

## 2023-08-05 MED ORDER — IOHEXOL 350 MG/ML SOLN
75.0000 mL | Freq: Once | INTRAVENOUS | Status: AC | PRN
  Administered 2023-08-05: 75 mL via INTRAVENOUS

## 2023-08-05 NOTE — ED Triage Notes (Signed)
 Pt arrived via [facility] from her PCP office for further evaluation of a DVT in her RLE. Pt endorses mild SOB.

## 2023-08-05 NOTE — ED Notes (Signed)
Pt going to CT now

## 2023-08-05 NOTE — Progress Notes (Signed)
 PHARMACY - ANTICOAGULATION CONSULT NOTE  Pharmacy Consult for Heparin Infusion Indication: pulmonary embolus  No Known Allergies  Patient Measurements: Height: 5\' 6"  (167.6 cm) Weight: 70.6 kg (155 lb 9.6 oz) IBW/kg (Calculated) : 59.3 HEPARIN DW (KG): 70.6  Vital Signs: Temp: 98.2 F (36.8 C) (04/03 1508) Temp Source: Oral (04/03 1508) BP: 154/96 (04/03 2045) Pulse Rate: 96 (04/03 2045)  Labs: Recent Labs    08/05/23 1703  HGB 18.1*  HCT 55.7*  PLT 519*  CREATININE 1.16*  TROPONINIHS 29*    Estimated Creatinine Clearance: 44.1 mL/min (A) (by C-G formula based on SCr of 1.16 mg/dL (H)).   Medical History: Past Medical History:  Diagnosis Date   Hypertension     Medications:  On apixaban 5 mg BID at home, last dose 4/3 AM  Assessment: Patient is a 68 year old female recently diagnosed with a PE, on Eliquis. She presented to ED with SOB and reports being compliant to Eliquis therapy. Pharmacy was consulted to start patient on a heparin infusion. Will monitor via aPTT levels given recent apixaban use.   Baseline INR and aPTT ordered.   No signs/symptoms of bleeding noted. Hgb 18.1. PLT 519.  Goal of Therapy:  Heparin level 0.3-0.7 units/ml aPTT 66-102 seconds Monitor platelets by anticoagulation protocol: Yes   Plan:  No bolus given recent apixaban use Start heparin infusion at 1100 units/hr Monitor via aPTT levels until HL correlates Check aPTT in 6 hours and HL daily Monitor CBC dailr  Jaishaun Mcnab H Teyona Nichelson 08/05/2023,9:49 PM

## 2023-08-05 NOTE — ED Provider Notes (Signed)
 [place] EMERGENCY DEPARTMENT AT [facility] Provider Note  CSN: [id number] Arrival date & time: 08/05/23 1445  Chief Complaint(s) DVT  HPI Betty Zamora is a 68 y.o. female of recent diagnosis of PE, on Eliquis presenting to the emergency department shortness of breath.  Patient reports that she was recently admitted for pulmonary embolism.  She was discharged, compliant with her medications including her Eliquis.  She reports that she has had persistent shortness of breath which is slightly been worsening.  Worse with any movement.  Not really having any chest pain.  No cough.  No fevers or chills.  No abdominal pain.  She also reports persistent leg swelling.  No nausea or vomiting.  No fevers or chills.   Past Medical History Past Medical History:  Diagnosis Date  . Hypertension    Patient Active Problem List   Diagnosis Date Noted  . Pleural effusion on right 08/05/2023  . Venous thromboembolism 07/30/2023  . Chronic kidney disease (CKD) 07/30/2023  . Pulmonary embolism (HCC) 07/22/2023  . Essential hypertension, benign 02/23/2017  . Subclinical hyperthyroidism 02/08/2017   Home Medication(s) Prior to Admission medications   Medication Sig Start Date End Date Taking? Authorizing Provider  acetaminophen (TYLENOL) 325 MG tablet Take 2 tablets (650 mg total) by mouth every 6 (six) hours as needed for mild pain (pain score 1-3) (or Fever >/= 101). 07/27/23  Yes Elgergawy, Leana Roe, MD  APIXABAN (ELIQUIS) VTE STARTER PACK (10MG  AND 5MG ) Take as directed on package: start with two-5mg  tablets twice daily for 5 days. On day 6, switch to one-5mg  tablet twice daily on 07/31/2023. 07/27/23  Yes Elgergawy, Leana Roe, MD  olmesartan (BENICAR) 40 MG tablet Take 40 mg by mouth daily. 07/14/23  Yes [provider]  pantoprazole (PROTONIX) 40 MG tablet Take 1 tablet (40 mg total) by mouth daily. Patient not taking: Reported on 08/05/2023 07/27/23   Elgergawy, Leana Roe, MD                                                                                                                                     Past Surgical History Past Surgical History:  Procedure Laterality Date  . ABDOMINAL HYSTERECTOMY     Family History History reviewed. No pertinent family history.  Social History Social History   Tobacco Use  . Smoking status: Former    Current packs/day: 0.50    Types: Cigarettes  . Smokeless tobacco: Never  Vaping Use  . Vaping status: Never Used  Substance Use Topics  . Alcohol use: Yes    Comment: occasionally  . Drug use: No   Allergies Patient has no known allergies.  Review of Systems Review of Systems  All other systems reviewed and are negative.   Physical Exam Vital Signs  I have reviewed the triage vital signs BP (!) 154/96   Pulse 96   Temp 98.2 F (36.8 C) (Oral)  Resp (!) 34   Ht 5\' 6"  (1.676 m)   Wt 70.6 kg   SpO2 94%   BMI 25.11 kg/m  Physical Exam Vitals and nursing note reviewed.  Constitutional:      General: She is not in acute distress.    Appearance: She is well-developed.  HENT:     Head: Normocephalic and atraumatic.     Mouth/Throat:     Mouth: Mucous membranes are moist.  Eyes:     Pupils: Pupils are equal, round, and reactive to light.  Cardiovascular:     Rate and Rhythm: Normal rate and regular rhythm.     Heart sounds: No murmur heard. Pulmonary:     Effort: Pulmonary effort is normal. No respiratory distress.     Breath sounds: Examination of the right-lower field reveals decreased breath sounds. Decreased breath sounds present.  Abdominal:     General: Abdomen is flat.     Palpations: Abdomen is soft.     Tenderness: There is no abdominal tenderness.  Musculoskeletal:        General: No tenderness.     Right lower leg: Edema present.     Left lower leg: Edema present.  Skin:    General: Skin is warm and dry.  Neurological:     General: No focal deficit present.     Mental Status: She  is alert. Mental status is at baseline.  Psychiatric:        Mood and Affect: Mood normal.        Behavior: Behavior normal.     ED Results and Treatments Labs (all labs ordered are listed, but only abnormal results are displayed) Labs Reviewed  BASIC METABOLIC PANEL WITH GFR - Abnormal; Notable for the following components:      Result Value   CO2 20 (*)    Glucose, Bld 100 (*)    BUN 30 (*)    Creatinine, Ser 1.16 (*)    GFR, Estimated 52 (*)    All other components within normal limits  BRAIN NATRIURETIC PEPTIDE - Abnormal; Notable for the following components:   B Natriuretic Peptide 107.0 (*)    All other components within normal limits  CBC WITH DIFFERENTIAL/PLATELET - Abnormal; Notable for the following components:   RBC 5.63 (*)    Hemoglobin 18.1 (*)    HCT 55.7 (*)    Platelets 519 (*)    All other components within normal limits  TROPONIN I (HIGH SENSITIVITY) - Abnormal; Notable for the following components:   Troponin I (High Sensitivity) 29 (*)    All other components within normal limits                                                                                                                          Radiology CT Angio Chest PE W/Cm &/Or Wo Cm Result Date: 08/05/2023 CLINICAL DATA:  Pulmonary embolism, progressive dyspnea EXAM: CT ANGIOGRAPHY CHEST WITH CONTRAST TECHNIQUE: Multidetector CT imaging of the  chest was performed using the standard protocol during bolus administration of intravenous contrast. Multiplanar CT image reconstructions and MIPs were obtained to evaluate the vascular anatomy. RADIATION DOSE REDUCTION: This exam was performed according to the departmental dose-optimization program which includes automated exposure control, adjustment of the mA and/or kV according to patient size and/or use of iterative reconstruction technique. CONTRAST:  75mL OMNIPAQUE IOHEXOL 350 MG/ML SOLN COMPARISON:  07/22/2023 FINDINGS: Cardiovascular: There is again seen  multiple branching intraluminal filling defects now which demonstrate a more eccentric position within the vessel lumen as well as a more irregular contour in keeping with subacute pulmonary embolism. The embolic burden is moderate, the embolic burden has decreased since prior examination. No interval acute pulmonary embolism identified. Central pulmonary arteries are enlarged in keeping with changes of pulmonary arterial hypertension. Previously noted right heart strain has resolved (RV/LV = 0.8). Global cardiac size is within normal limits. No pericardial effusion. The thoracic aorta is of normal caliber. Mild atherosclerotic calcification. Mediastinum/Nodes: No enlarged mediastinal, hilar, or axillary lymph nodes. Thyroid gland, trachea, and esophagus demonstrate no significant findings. Lungs/Pleura: Large right pleural effusion has developed with subtotal collapse of the right lung. There is non enhancement involving the lateral segment of the right middle lobe in keeping with a pulmonary infarct within this region. Left lung is clear. No pleural effusion on the left. No pneumothorax. Upper Abdomen: No acute abnormality. Musculoskeletal: Remote healed left rib fractures. No acute bone abnormality Review of the MIP images confirms the above findings. IMPRESSION: 1. Subacute pulmonary embolism. Moderate embolic burden, decreased since prior examination. No interval acute pulmonary embolism identified. Resolution of previously noted right heart strain. 2. Interval development of a large right pleural effusion with subtotal collapse of the right lung. 3. Interval development of a pulmonary infarct involving the lateral segment of the right middle lobe. 4. Morphologic changes in keeping with pulmonary arterial hypertension. Aortic Atherosclerosis (ICD10-I70.0). Electronically Signed   By: Helyn Numbers M.D.   On: 08/05/2023 20:06   DG Chest 2 View Result Date: 08/05/2023 CLINICAL DATA:  Short of breath, right  lower extremity DVT and PE EXAM: CHEST - 2 VIEW COMPARISON:  07/24/2023 FINDINGS: Frontal and lateral views of the chest demonstrate a stable cardiac silhouette. There is progressive opacification of the lower 2/3 of the right hemithorax, compatible with enlarging right pleural effusion and underlying consolidation. This could reflect atelectasis or infarct given known pulmonary embolus. Left chest is clear. No pneumothorax. No acute bony abnormalities. IMPRESSION: 1. Increased opacification right hemithorax consistent with enlarging pleural effusion and progressive right lung consolidation. This could reflect underlying atelectasis or infarct, given patient's known history of pulmonary embolus. Electronically Signed   By: Sharlet Salina M.D.   On: 08/05/2023 17:05    Pertinent labs & imaging results that were available during my care of the patient were reviewed by me and considered in my medical decision making (see MDM for details).  Medications Ordered in ED Medications  iohexol (OMNIPAQUE) 350 MG/ML injection 75 mL (75 mLs Intravenous Contrast Given 08/05/23 1950)  furosemide (LASIX) injection 20 mg (20 mg Intravenous Given 08/05/23 2040)  Procedures Procedures  (including critical care time)  Medical Decision Making / ED Course   MDM:  68 year old presenting to the emergency department shortness of breath.  Patient well-appearing, reviewed previous imaging, did have pulmonary embolism, with signs of right heart strain.  Differential includes persistent or worsening pulmonary embolism, superimposed infectious process such as pneumonia, CHF, deconditioning.  She does have some decreased breath sounds in the right side.  Chest x-ray obtained shows opacification of the right hemithorax which appears worse than previous x-ray on March 22.  Will obtain repeat CT scan  to further evaluate for worsening PE burden, effusion, or other acute process.  Clinical Course as of 08/05/23 2356  Thu Aug 05, 2023  2138 CT scans shows improving pulmonary embolism burden but interval development of right pleural effusion, likely related to pulmonary infarct.  Given her dyspnea, think patient may need to have this drained.  Discussed with the hospitalist to admit the patient. [WS]  2355 Started on heparin to facilitate procedure  [WS]    Clinical Course User Index [WS] Lonell Grandchild, MD     Additional history obtained: -Additional history obtained from friend -External records from outside source obtained and reviewed including: Chart review including previous notes, labs, imaging, consultation notes including prior notes    Lab Tests: -I ordered, reviewed, and interpreted labs.   The pertinent results include:   Labs Reviewed  BASIC METABOLIC PANEL WITH GFR - Abnormal; Notable for the following components:      Result Value   CO2 20 (*)    Glucose, Bld 100 (*)    BUN 30 (*)    Creatinine, Ser 1.16 (*)    GFR, Estimated 52 (*)    All other components within normal limits  BRAIN NATRIURETIC PEPTIDE - Abnormal; Notable for the following components:   B Natriuretic Peptide 107.0 (*)    All other components within normal limits  CBC WITH DIFFERENTIAL/PLATELET - Abnormal; Notable for the following components:   RBC 5.63 (*)    Hemoglobin 18.1 (*)    HCT 55.7 (*)    Platelets 519 (*)    All other components within normal limits  TROPONIN I (HIGH SENSITIVITY) - Abnormal; Notable for the following components:   Troponin I (High Sensitivity) 29 (*)    All other components within normal limits    Notable for mild elevated  BNP, elevated troponin  EKG   EKG Interpretation Date/Time:  Thursday August 05 2023 15:13:35 EDT Ventricular Rate:  92 PR Interval:  118 QRS Duration:  74 QT Interval:  378 QTC Calculation: 467 R Axis:   -18  Text  Interpretation: Normal sinus rhythm Nonspecific ST abnormality No significant change since last tracing Confirmed by Alvino Blood ([id number]) on 08/05/2023 6:05:37 PM         Imaging Studies ordered: I ordered imaging studies including CT chest On my interpretation imaging demonstrates pleural effusiobn I independently visualized and interpreted imaging. I agree with the radiologist interpretation   Medicines ordered and prescription drug management: Meds ordered this encounter  Medications  . iohexol (OMNIPAQUE) 350 MG/ML injection 75 mL  . furosemide (LASIX) injection 20 mg    -I have reviewed the patients home medicines and have made adjustments as needed   Consultations Obtained: I requested consultation with the hospitalist,  and discussed lab and imaging findings as well as pertinent plan - they recommend: admission   Cardiac Monitoring: The patient was maintained on a cardiac monitor.  I personally viewed and  interpreted the cardiac monitored which showed an underlying rhythm of: NSR   Reevaluation: After the interventions noted above, I reevaluated the patient and found that their symptoms have improved  Co morbidities that complicate the patient evaluation . Past Medical History:  Diagnosis Date  . Hypertension       Dispostion: Disposition decision including need for hospitalization was considered, and patient admitted to the hospital.    Final Clinical Impression(s) / ED Diagnoses Final diagnoses:  Pleural effusion     This chart was dictated using voice recognition software.  Despite best efforts to proofread,  errors can occur which can change the documentation meaning.    Lonell Grandchild, MD 08/05/23 2140

## 2023-08-05 NOTE — ED Provider Triage Note (Signed)
 Emergency Medicine Provider Triage Evaluation Note  Betty Zamora , a 68 y.o. female  was evaluated in triage.  Pt complains of shortness of breath, patient was recently hospitalized with a pulmonary embolism and DVT but was seen by her PCP for follow-up and sent back because she is feeling worse than when she was discharged from the hospital.  Patient states she has not missed any doses of her Eliquis..  Review of Systems  Positive: Shortness of breath or leg swelling Negative: Fever  Physical Exam  BP (!) 147/90 (BP Location: Right Arm)   Pulse 93   Temp 98.2 F (36.8 C) (Oral)   Resp 16   Ht 5\' 6"  (1.676 m)   Wt 70.6 kg   SpO2 94%   BMI 25.11 kg/m  Gen:   Awake, no distress   Resp:  Normal effort  MSK:   Moves extremities without difficulty  Other:    Medical Decision Making  Medically screening exam initiated at 4:17 PM.  Appropriate orders placed.  Katiya J Lubrano was informed that the remainder of the evaluation will be completed by another provider, this initial triage assessment does not replace that evaluation, and the importance of remaining in the ED until their evaluation is complete.     Ma Rings, [place] 08/05/23 [phone]

## 2023-08-05 NOTE — H&P (Incomplete)
 History and Physical    Patient: Betty Zamora ZOX:[id number] DOB: 04/30/1956 DOA: 08/05/2023 DOS: the patient was seen and examined on 08/06/2023 PCP: Assunta Found, MD  Patient coming from: Home  Chief Complaint:  Chief Complaint  Patient presents with   DVT   HPI: Betty Zamora is a 68 y.o. female with medical history significant of hypertension, vitamin D deficiency, PE on Eliquis, GERD and chronic kidney disease; who presented to the emergency department due to increasing shortness of breath.  Patient was started Lipantil from 320-325 due to acute pulmonary embolism, pulmonary infarction and acute right lower extremity DVT, she was started on heparin drip and this was transitioned to Eliquis prior to discharge.  She now presents to the emergency department due to increasing shortness of breath that has been ongoing for about a week.  Shortness of breath worsens on lying flat.  She denies fever, chills, and headache, chest pain.  ED Course:  In the emergency department, she was tachypneic, BP was 147/90, other vital signs are within normal range.  Workup in the ED showed normocytic anemia and thrombocytosis.  BMP was normal except for bicarb of 20, blood glucose 100, BUN 30, creatinine 1.16 (creatinine is within baseline range).  BNP 107.0 (this was 123.9 on 3/24).  Troponin 29. CT angiogram of chest with contrast showed subacute pulmonary embolism. Moderate embolic burden, decreased since prior examination. No interval acute pulmonary embolism identified. Resolution of previously noted right heart strain. Interval development of a large right pleural effusion with subtotal collapse of the right lung. Chest x-ray showed  Increased opacification right hemithorax consistent with enlarging pleural effusion and progressive right lung consolidation. This could reflect underlying atelectasis or infarct, given patient's known history of pulmonary embolus. She was treated with IV Lasix.   Hospitalist was asked to admit patient for further evaluation and management.  Review of Systems: Review of systems as noted in the HPI. All other systems reviewed and are negative.   Past Medical History:  Diagnosis Date   Hypertension    Past Surgical History:  Procedure Laterality Date   ABDOMINAL HYSTERECTOMY      Social History:  reports that she has quit smoking. Her smoking use included cigarettes. She has never used smokeless tobacco. She reports current alcohol use. She reports that she does not use drugs.   No Known Allergies  History reviewed. No pertinent family history.   Prior to Admission medications   Medication Sig Start Date End Date Taking? Authorizing Provider  acetaminophen (TYLENOL) 325 MG tablet Take 2 tablets (650 mg total) by mouth every 6 (six) hours as needed for mild pain (pain score 1-3) (or Fever >/= 101). 07/27/23  Yes Elgergawy, Leana Roe, MD  APIXABAN (ELIQUIS) VTE STARTER PACK (10MG  AND 5MG ) Take as directed on package: start with two-5mg  tablets twice daily for 5 days. On day 6, switch to one-5mg  tablet twice daily on 07/31/2023. 07/27/23  Yes Elgergawy, Leana Roe, MD  olmesartan (BENICAR) 40 MG tablet Take 40 mg by mouth daily. 07/14/23  Yes [provider]  pantoprazole (PROTONIX) 40 MG tablet Take 1 tablet (40 mg total) by mouth daily. Patient not taking: Reported on 08/05/2023 07/27/23   Elgergawy, Leana Roe, MD    Physical Exam: BP (!) 159/91   Pulse 91   Temp 98.3 F (36.8 C) (Oral)   Resp (!) 23   Ht 5\' 6"  (1.676 m)   Wt 70.6 kg   SpO2 94%   BMI 25.11 kg/m  General: 68 y.o. year-old female well developed well nourished in no acute distress.  Alert and oriented x3. HEENT: NCAT, EOMI Neck: Supple, trachea medial Cardiovascular: Regular rate and rhythm with no rubs or gallops.  + JVD noted.  B/L lower extremity edema. 2/4 pulses in all 4 extremities. Respiratory: Tachypnea.  Patient could bearly complete a sentence without being  short of breath.  Rales noted RLL.  Scattered rhonchi.   Abdomen: Soft, nontender nondistended with normal bowel sounds x4 quadrants. Muskuloskeletal: No cyanosis, clubbing or edema noted bilaterally Neuro: CN II-XII intact, strength 5/5 x 4, sensation, reflexes intact Skin: No ulcerative lesions noted or rashes Psychiatry: Mood is appropriate for condition and setting          Labs on Admission:  Basic Metabolic Panel: Recent Labs  Lab 08/05/23 1703  NA 139  K 5.0  CL 110  CO2 20*  GLUCOSE 100*  BUN 30*  CREATININE 1.16*  CALCIUM 8.9   Liver Function Tests: No results for input(s): "AST", "ALT", "ALKPHOS", "BILITOT", "PROT", "ALBUMIN" in the last 168 hours. No results for input(s): "LIPASE", "AMYLASE" in the last 168 hours. No results for input(s): "AMMONIA" in the last 168 hours. CBC: Recent Labs  Lab 08/05/23 1703  WBC 7.4  NEUTROABS 5.9  HGB 18.1*  HCT 55.7*  MCV 98.9  PLT 519*   Cardiac Enzymes: No results for input(s): "CKTOTAL", "CKMB", "CKMBINDEX", "TROPONINI" in the last 168 hours.  BNP (last 3 results) Recent Labs    07/24/23 0542 07/26/23 0508 08/05/23 1703  BNP 123.6* 123.9* 107.0*    ProBNP (last 3 results) No results for input(s): "PROBNP" in the last 8760 hours.  CBG: No results for input(s): "GLUCAP" in the last 168 hours.  Radiological Exams on Admission: CT Angio Chest PE W/Cm &/Or Wo Cm Result Date: 08/05/2023 CLINICAL DATA:  Pulmonary embolism, progressive dyspnea EXAM: CT ANGIOGRAPHY CHEST WITH CONTRAST TECHNIQUE: Multidetector CT imaging of the chest was performed using the standard protocol during bolus administration of intravenous contrast. Multiplanar CT image reconstructions and MIPs were obtained to evaluate the vascular anatomy. RADIATION DOSE REDUCTION: This exam was performed according to the departmental dose-optimization program which includes automated exposure control, adjustment of the mA and/or kV according to patient size  and/or use of iterative reconstruction technique. CONTRAST:  75mL OMNIPAQUE IOHEXOL 350 MG/ML SOLN COMPARISON:  07/22/2023 FINDINGS: Cardiovascular: There is again seen multiple branching intraluminal filling defects now which demonstrate a more eccentric position within the vessel lumen as well as a more irregular contour in keeping with subacute pulmonary embolism. The embolic burden is moderate, the embolic burden has decreased since prior examination. No interval acute pulmonary embolism identified. Central pulmonary arteries are enlarged in keeping with changes of pulmonary arterial hypertension. Previously noted right heart strain has resolved (RV/LV = 0.8). Global cardiac size is within normal limits. No pericardial effusion. The thoracic aorta is of normal caliber. Mild atherosclerotic calcification. Mediastinum/Nodes: No enlarged mediastinal, hilar, or axillary lymph nodes. Thyroid gland, trachea, and esophagus demonstrate no significant findings. Lungs/Pleura: Large right pleural effusion has developed with subtotal collapse of the right lung. There is non enhancement involving the lateral segment of the right middle lobe in keeping with a pulmonary infarct within this region. Left lung is clear. No pleural effusion on the left. No pneumothorax. Upper Abdomen: No acute abnormality. Musculoskeletal: Remote healed left rib fractures. No acute bone abnormality Review of the MIP images confirms the above findings. IMPRESSION: 1. Subacute pulmonary embolism. Moderate embolic burden, decreased  since prior examination. No interval acute pulmonary embolism identified. Resolution of previously noted right heart strain. 2. Interval development of a large right pleural effusion with subtotal collapse of the right lung. 3. Interval development of a pulmonary infarct involving the lateral segment of the right middle lobe. 4. Morphologic changes in keeping with pulmonary arterial hypertension. Aortic Atherosclerosis  (ICD10-I70.0). Electronically Signed   By: Helyn Numbers M.D.   On: 08/05/2023 20:06   DG Chest 2 View Result Date: 08/05/2023 CLINICAL DATA:  Short of breath, right lower extremity DVT and PE EXAM: CHEST - 2 VIEW COMPARISON:  07/24/2023 FINDINGS: Frontal and lateral views of the chest demonstrate a stable cardiac silhouette. There is progressive opacification of the lower 2/3 of the right hemithorax, compatible with enlarging right pleural effusion and underlying consolidation. This could reflect atelectasis or infarct given known pulmonary embolus. Left chest is clear. No pneumothorax. No acute bony abnormalities. IMPRESSION: 1. Increased opacification right hemithorax consistent with enlarging pleural effusion and progressive right lung consolidation. This could reflect underlying atelectasis or infarct, given patient's known history of pulmonary embolus. Electronically Signed   By: Sharlet Salina M.D.   On: 08/05/2023 17:05    EKG: I independently viewed the EKG done and my findings are as followed: Normal sinus rhythm at a rate of 92 bpm  Assessment/Plan Present on Admission:  Pleural effusion on right  Pulmonary embolism (HCC)  Essential hypertension, benign  Chronic kidney disease (CKD)  Principal Problem:   Pleural effusion on right Active Problems:   Essential hypertension, benign   Pulmonary embolism (HCC)   Chronic kidney disease (CKD)   Thrombocytosis   Elevated troponin   Pulmonary infarct (HCC)  Pleural effusion on right Patient will require thoracentesis Since patient is on Eliquis, there would be a need for Eliquis washout period which will be on Saturday (last dose was this morning -4/3).  No IR at [facility] on weekend. Patient was admitted to [facility] for possible thoracentesis  History of pulm embolism on Eliquis Pulmonary infarcts Eliquis will be held at this time due to need for thoracentesis Continue Heparin drip and hold prior to thoracentesis  Elevated troponin 2/2 type 2  demand isschemia Troponin x 1 - 29, continue to trend troponin  Chronic diastolic CHF Elevated BNP 107.0 (this was 123.9 about 11 days ago) IV Lasix 20 mg x 1 was given in the ED Continue total input/output, daily weights and fluid restriction Continue heart healthy diet  Echocardiogram done on 07/23/2023 showed LVEF 45 to 70%.  No RWMA.  G1 DD  Thrombocytosis possibly reactive Platelets 519, continue to monitor platelet level  Essential hypertension Continue home meds  CKD 3B Creatinine 1.16 (creatinine is within baseline range) Renally adjust medications, avoid nephrotoxic agents/dehydration/hypotension  GERD Continue Protonix   DVT prophylaxis: Heparin drip  Family Communication: Sister at bedside (all questions answered to satisfaction)   Advance Care Planning:   Code Status: Full Code   Consults: None  Severity of Illness: The appropriate patient status for this patient is INPATIENT. Inpatient status is judged to be reasonable and necessary in order to provide the required intensity of service to ensure the patient's safety. The patient's presenting symptoms, physical exam findings, and initial radiographic and laboratory data in the context of their chronic comorbidities is felt to place them at high risk for further clinical deterioration. Furthermore, it is not anticipated that the patient will be medically stable for discharge from the hospital within 2 midnights of admission.   *  I certify that at the point of admission it is my clinical judgment that the patient will require inpatient hospital care spanning beyond 2 midnights from the point of admission due to high intensity of service, high risk for further deterioration and high frequency of surveillance required.*  Author: Frankey Shown, DO 08/06/2023 7:02 AM  For on call review [email or web address].

## 2023-08-05 NOTE — ED Notes (Signed)
 Patient transported to CT

## 2023-08-06 ENCOUNTER — Inpatient Hospital Stay (HOSPITAL_COMMUNITY)

## 2023-08-06 DIAGNOSIS — J90 Pleural effusion, not elsewhere classified: Secondary | ICD-10-CM | POA: Diagnosis not present

## 2023-08-06 DIAGNOSIS — I2699 Other pulmonary embolism without acute cor pulmonale: Secondary | ICD-10-CM | POA: Insufficient documentation

## 2023-08-06 DIAGNOSIS — R7989 Other specified abnormal findings of blood chemistry: Secondary | ICD-10-CM | POA: Insufficient documentation

## 2023-08-06 DIAGNOSIS — D75839 Thrombocytosis, unspecified: Secondary | ICD-10-CM | POA: Insufficient documentation

## 2023-08-06 LAB — GLUCOSE, PLEURAL OR PERITONEAL FLUID: Glucose, Fluid: 95 mg/dL

## 2023-08-06 LAB — COMPREHENSIVE METABOLIC PANEL WITH GFR
ALT: 13 U/L (ref 0–44)
AST: 14 U/L — ABNORMAL LOW (ref 15–41)
Albumin: <1.5 g/dL — ABNORMAL LOW (ref 3.5–5.0)
Alkaline Phosphatase: 84 U/L (ref 38–126)
Anion gap: 8 (ref 5–15)
BUN: 32 mg/dL — ABNORMAL HIGH (ref 8–23)
CO2: 20 mmol/L — ABNORMAL LOW (ref 22–32)
Calcium: 8.5 mg/dL — ABNORMAL LOW (ref 8.9–10.3)
Chloride: 109 mmol/L (ref 98–111)
Creatinine, Ser: 1.1 mg/dL — ABNORMAL HIGH (ref 0.44–1.00)
GFR, Estimated: 55 mL/min — ABNORMAL LOW (ref >60)
Glucose, Bld: 84 mg/dL (ref 70–99)
Potassium: 4.9 mmol/L (ref 3.5–5.1)
Sodium: 137 mmol/L (ref 135–145)
Total Bilirubin: 0.6 mg/dL (ref 0.0–1.2)
Total Protein: 5.3 g/dL — ABNORMAL LOW (ref 6.5–8.1)

## 2023-08-06 LAB — BODY FLUID CELL COUNT WITH DIFFERENTIAL
Eos, Fluid: 0 %
Lymphs, Fluid: 23 %
Monocyte-Macrophage-Serous Fluid: 2 % — ABNORMAL LOW (ref 50–90)
Neutrophil Count, Fluid: 75 % — ABNORMAL HIGH (ref 0–25)
Total Nucleated Cell Count, Fluid: 6999 uL — ABNORMAL HIGH (ref 0–1000)

## 2023-08-06 LAB — MAGNESIUM: Magnesium: 2.2 mg/dL (ref 1.7–2.4)

## 2023-08-06 LAB — APTT
aPTT: 68 s — ABNORMAL HIGH (ref 24–36)
aPTT: 41 s — ABNORMAL HIGH (ref 24–36)

## 2023-08-06 LAB — LACTATE DEHYDROGENASE, PLEURAL OR PERITONEAL FLUID: LD, Fluid: 215 U/L — ABNORMAL HIGH (ref 3–23)

## 2023-08-06 LAB — CBC
HCT: 44.5 % (ref 36.0–46.0)
Hemoglobin: 14.2 g/dL (ref 12.0–15.0)
MCH: 31.8 pg (ref 26.0–34.0)
MCHC: 31.9 g/dL (ref 30.0–36.0)
MCV: 99.8 fL (ref 80.0–100.0)
Platelets: 586 10*3/uL — ABNORMAL HIGH (ref 150–400)
RBC: 4.46 MIL/uL (ref 3.87–5.11)
RDW: 14.1 % (ref 11.5–15.5)
WBC: 8.3 10*3/uL (ref 4.0–10.5)
nRBC: 0 % (ref 0.0–0.2)

## 2023-08-06 LAB — HIV ANTIBODY (ROUTINE TESTING W REFLEX): HIV Screen 4th Generation wRfx: NONREACTIVE

## 2023-08-06 LAB — HEPARIN LEVEL (UNFRACTIONATED): Heparin Unfractionated: >1.1 [IU]/mL — ABNORMAL HIGH (ref 0.30–0.70)

## 2023-08-06 LAB — PHOSPHORUS: Phosphorus: 3.8 mg/dL (ref 2.5–4.6)

## 2023-08-06 MED ORDER — ACETAMINOPHEN 325 MG PO TABS
650.0000 mg | ORAL_TABLET | Freq: Four times a day (QID) | ORAL | Status: DC | PRN
  Administered 2023-08-06 – 2023-08-11 (×6): 650 mg via ORAL
  Filled 2023-08-06 (×6): qty 2

## 2023-08-06 MED ORDER — ONDANSETRON HCL 4 MG/2ML IJ SOLN: 4.0000 mg | Freq: Four times a day (QID) | INTRAMUSCULAR | Status: DC | PRN

## 2023-08-06 MED ORDER — IRBESARTAN 300 MG PO TABS
300.0000 mg | ORAL_TABLET | Freq: Every day | ORAL | Status: DC
  Administered 2023-08-07 – 2023-08-10 (×4): 300 mg via ORAL
  Filled 2023-08-06 (×6): qty 1

## 2023-08-06 MED ORDER — ACETAMINOPHEN 650 MG RE SUPP: 650.0000 mg | Freq: Four times a day (QID) | RECTAL | Status: DC | PRN

## 2023-08-06 MED ORDER — ONDANSETRON HCL 4 MG PO TABS: 4.0000 mg | ORAL_TABLET | Freq: Four times a day (QID) | ORAL | Status: DC | PRN

## 2023-08-06 MED ORDER — LIDOCAINE HCL 1 % IJ SOLN
INTRAMUSCULAR | Status: AC
  Filled 2023-08-06: qty 20

## 2023-08-06 NOTE — Progress Notes (Signed)
Pt going to IR for thoracentesis.

## 2023-08-06 NOTE — ED Notes (Signed)
 ED TO INPATIENT HANDOFF REPORT  ED Nurse Name and Phone #: Haze Justin [phone]  S Name/Age/Gender Betty Zamora 68 y.o. female Room/Bed: APA05/APA05  Code Status   Code Status: Full Code  Home/SNF/Other Home Patient oriented to: self, place, time, and situation Is this baseline? Yes   Triage Complete: Triage complete  Chief Complaint Pleural effusion on right [J90]  Triage Note Pt arrived via [facility]PV from her PCP office for further evaluation of a DVT in her RLE. Pt endorses mild SOB.    Allergies No Known Allergies  Level of Care/Admitting Diagnosis ED Disposition     ED Disposition  Admit   Condition  --   Comment  Hospital Area: [facility] [[id number]]  Level of Care: Telemetry Medical [104]  May admit patient to [facility] or [facility] if equivalent level of care is available:: Yes  Covid Evaluation: Asymptomatic - no recent exposure (last 10 days) testing not required  Diagnosis: Pleural effusion on right [696295]  Admitting Physician: Frankey Shown [[id number]]  Attending Physician: Frankey Shown [[id number]]  Certification:: I certify this patient will need inpatient services for at least 2 midnights  Expected Medical Readiness: 08/08/2023          B Medical/Surgery History Past Medical History:  Diagnosis Date   Hypertension    Past Surgical History:  Procedure Laterality Date   ABDOMINAL HYSTERECTOMY       A IV Location/Drains/Wounds Patient Lines/Drains/Airways Status     Active Line/Drains/Airways     Name Placement date Placement time Site Days   Peripheral IV 08/05/23 20 G Right Antecubital 08/05/23  1720  Antecubital  1            Intake/Output Last 24 hours  Intake/Output Summary (Last 24 hours) at 08/06/2023 1225 Last data filed at 08/06/2023 0708 Gross per 24 hour  Intake --  Output 400 ml  Net -400 ml    Labs/Imaging Results for orders placed or performed during the hospital encounter of  08/05/23 (from the past 48 hours)  Basic metabolic panel     Status: Abnormal   Collection Time: 08/05/23  5:03 PM  Result Value Ref Range   Sodium 139 135 - 145 mmol/L   Potassium 5.0 3.5 - 5.1 mmol/L    Comment: HEMOLYSIS AT THIS LEVEL MAY AFFECT RESULT HEMOLYSIS AT THIS LEVEL MAY AFFECT RESULT    Chloride 110 98 - 111 mmol/L   CO2 20 (L) 22 - 32 mmol/L   Glucose, Bld 100 (H) 70 - 99 mg/dL    Comment: Glucose reference range applies only to samples taken after fasting for at least 8 hours.   BUN 30 (H) 8 - 23 mg/dL   Creatinine, Ser 5.36 (H) 0.44 - 1.00 mg/dL   Calcium 8.9 8.9 - 64.4 mg/dL   GFR, Estimated 52 (L) >60 mL/min    Comment: (NOTE) Calculated using the CKD-EPI Creatinine Equation (2021)    Anion gap 9 5 - 15    Comment: Performed at [facility], [address] [place], [place] [address]  Brain natriuretic peptide     Status: Abnormal   Collection Time: 08/05/23  5:03 PM  Result Value Ref Range   B Natriuretic Peptide 107.0 (H) 0.0 - 100.0 pg/mL    Comment: Performed at [facility], [address] [place], [place] [address]  CBC with Differential     Status: Abnormal   Collection Time: 08/05/23  5:03 PM  Result Value Ref Range  WBC 7.4 4.0 - 10.5 K/uL   RBC 5.63 (H) 3.87 - 5.11 MIL/uL   Hemoglobin 18.1 (H) 12.0 - 15.0 g/dL   HCT 16.1 (H) 09.6 - 04.5 %   MCV 98.9 80.0 - 100.0 fL   MCH 32.1 26.0 - 34.0 pg   MCHC 32.5 30.0 - 36.0 g/dL   RDW 40.9 81.1 - 91.4 %   Platelets 519 (H) 150 - 400 K/uL   nRBC 0.0 0.0 - 0.2 %   Neutrophils Relative % 81 %   Neutro Abs 5.9 1.7 - 7.7 K/uL   Lymphocytes Relative 14 %   Lymphs Abs 1.0 0.7 - 4.0 K/uL   Monocytes Relative 5 %   Monocytes Absolute 0.4 0.1 - 1.0 K/uL   Eosinophils Relative 0 %   Eosinophils Absolute 0.0 0.0 - 0.5 K/uL   Basophils Relative 0 %   Basophils Absolute 0.0 0.0 - 0.1 K/uL   Immature Granulocytes 0 %   Abs Immature Granulocytes 0.02 0.00 - 0.07 K/uL    Comment: Performed at [facility],  [address], [place], [place] [address]  Troponin I (High Sensitivity)     Status: Abnormal   Collection Time: 08/05/23  5:03 PM  Result Value Ref Range   Troponin I (High Sensitivity) 29 (H) <18 ng/L    Comment: (NOTE) Elevated high sensitivity troponin I (hsTnI) values and significant  changes across serial measurements may suggest ACS but many other  chronic and acute conditions are known to elevate hsTnI results.  Refer to the "Links" section for chest pain algorithms and additional  guidance. Performed at [facility], [address], [place], [place] [address]   HIV Antibody (routine testing w rflx)     Status: None   Collection Time: 08/05/23 10:20 PM  Result Value Ref Range   HIV Screen 4th Generation wRfx Non Reactive Non Reactive    Comment: Performed at [facility], [address] [place], [place] [address]  APTT     Status: Abnormal   Collection Time: 08/06/23  7:49 AM  Result Value Ref Range   aPTT 68 (H) 24 - 36 seconds    Comment:        IF BASELINE aPTT IS ELEVATED, SUGGEST PATIENT RISK ASSESSMENT BE USED TO DETERMINE APPROPRIATE ANTICOAGULANT THERAPY. Performed at [facility], [address], [place], [place] [address]   Heparin level (unfractionated)     Status: Abnormal   Collection Time: 08/06/23  7:49 AM  Result Value Ref Range   Heparin Unfractionated >1.10 (H) 0.30 - 0.70 IU/mL    Comment: (NOTE) The clinical reportable range upper limit is being lowered to >1.10 to align with the FDA approved guidance for the current laboratory assay.  If heparin results are below expected values, and patient dosage has  been confirmed, suggest follow up testing of antithrombin III levels. Performed at [facility], [address], [place], [place] [address]   CBC     Status: Abnormal   Collection Time: 08/06/23  7:49 AM  Result Value Ref Range   WBC 8.3 4.0 - 10.5 K/uL   RBC 4.46 3.87 - 5.11 MIL/uL   Hemoglobin 14.2 12.0 - 15.0 g/dL   HCT 13.2 44.0 - 10.2 %    MCV 99.8 80.0 - 100.0 fL   MCH 31.8 26.0 - 34.0 pg   MCHC 31.9 30.0 - 36.0 g/dL   RDW 72.5 36.6 - 44.0 %   Platelets 586 (H) 150 - 400 K/uL  nRBC 0.0 0.0 - 0.2 %    Comment: Performed at [facility], [address] [place], [place] [address]  Comprehensive metabolic panel with GFR     Status: Abnormal   Collection Time: 08/06/23  7:49 AM  Result Value Ref Range   Sodium 137 135 - 145 mmol/L   Potassium 4.9 3.5 - 5.1 mmol/L   Chloride 109 98 - 111 mmol/L   CO2 20 (L) 22 - 32 mmol/L   Glucose, Bld 84 70 - 99 mg/dL    Comment: Glucose reference range applies only to samples taken after fasting for at least 8 hours.   BUN 32 (H) 8 - 23 mg/dL   Creatinine, Ser 4.78 (H) 0.44 - 1.00 mg/dL   Calcium 8.5 (L) 8.9 - 10.3 mg/dL   Total Protein 5.3 (L) 6.5 - 8.1 g/dL   Albumin <2.9 (L) 3.5 - 5.0 g/dL   AST 14 (L) 15 - 41 U/L   ALT 13 0 - 44 U/L   Alkaline Phosphatase 84 38 - 126 U/L   Total Bilirubin 0.6 0.0 - 1.2 mg/dL   GFR, Estimated 55 (L) >60 mL/min    Comment: (NOTE) Calculated using the CKD-EPI Creatinine Equation (2021)    Anion gap 8 5 - 15    Comment: Performed at [facility], [address] [place], [place] [address]  Magnesium     Status: None   Collection Time: 08/06/23  7:49 AM  Result Value Ref Range   Magnesium 2.2 1.7 - 2.4 mg/dL    Comment: Performed at [facility], [address], [place], [place] [address]  Phosphorus     Status: None   Collection Time: 08/06/23  7:49 AM  Result Value Ref Range   Phosphorus 3.8 2.5 - 4.6 mg/dL    Comment: Performed at [facility], [address] [place], [place] [address]   CT Angio Chest PE W/Cm &/Or Wo Cm Result Date: 08/05/2023 CLINICAL DATA:  Pulmonary embolism, progressive dyspnea EXAM: CT ANGIOGRAPHY CHEST WITH CONTRAST TECHNIQUE: Multidetector CT imaging of the chest was performed using the standard protocol during bolus administration of intravenous contrast. Multiplanar CT image reconstructions and MIPs were obtained to  evaluate the vascular anatomy. RADIATION DOSE REDUCTION: This exam was performed according to the departmental dose-optimization program which includes automated exposure control, adjustment of the mA and/or kV according to patient size and/or use of iterative reconstruction technique. CONTRAST:  75mL OMNIPAQUE IOHEXOL 350 MG/ML SOLN COMPARISON:  07/22/2023 FINDINGS: Cardiovascular: There is again seen multiple branching intraluminal filling defects now which demonstrate a more eccentric position within the vessel lumen as well as a more irregular contour in keeping with subacute pulmonary embolism. The embolic burden is moderate, the embolic burden has decreased since prior examination. No interval acute pulmonary embolism identified. Central pulmonary arteries are enlarged in keeping with changes of pulmonary arterial hypertension. Previously noted right heart strain has resolved (RV/LV = 0.8). Global cardiac size is within normal limits. No pericardial effusion. The thoracic aorta is of normal caliber. Mild atherosclerotic calcification. Mediastinum/Nodes: No enlarged mediastinal, hilar, or axillary lymph nodes. Thyroid gland, trachea, and esophagus demonstrate no significant findings. Lungs/Pleura: Large right pleural effusion has developed with subtotal collapse of the right lung. There is non enhancement involving the lateral segment of the right middle lobe in keeping with a pulmonary infarct within this region. Left lung is clear. No pleural effusion on the left. No pneumothorax. Upper Abdomen: No acute abnormality. Musculoskeletal: Remote healed left rib fractures. No  acute bone abnormality Review of the MIP images confirms the above findings. IMPRESSION: 1. Subacute pulmonary embolism. Moderate embolic burden, decreased since prior examination. No interval acute pulmonary embolism identified. Resolution of previously noted right heart strain. 2. Interval development of a large right pleural effusion with  subtotal collapse of the right lung. 3. Interval development of a pulmonary infarct involving the lateral segment of the right middle lobe. 4. Morphologic changes in keeping with pulmonary arterial hypertension. Aortic Atherosclerosis (ICD10-I70.0). Electronically Signed   By: Helyn Numbers M.D.   On: 08/05/2023 20:06   DG Chest 2 View Result Date: 08/05/2023 CLINICAL DATA:  Short of breath, right lower extremity DVT and PE EXAM: CHEST - 2 VIEW COMPARISON:  07/24/2023 FINDINGS: Frontal and lateral views of the chest demonstrate a stable cardiac silhouette. There is progressive opacification of the lower 2/3 of the right hemithorax, compatible with enlarging right pleural effusion and underlying consolidation. This could reflect atelectasis or infarct given known pulmonary embolus. Left chest is clear. No pneumothorax. No acute bony abnormalities. IMPRESSION: 1. Increased opacification right hemithorax consistent with enlarging pleural effusion and progressive right lung consolidation. This could reflect underlying atelectasis or infarct, given patient's known history of pulmonary embolus. Electronically Signed   By: Sharlet Salina M.D.   On: 08/05/2023 17:05    Pending Labs Unresulted Labs (From admission, onward)     Start     Ordered   08/07/23 0500  CBC  (Procedure Panel)  Tomorrow morning,   R       Placed in "And" Linked Group   08/06/23 0731   08/07/23 0500  Basic metabolic panel with GFR  (Procedure Panel)  Tomorrow morning,   R       Placed in "And" Linked Group   08/06/23 0731   08/07/23 0500  Magnesium  (Procedure Panel)  Tomorrow morning,   R       Placed in "And" Linked Group   08/06/23 0731   08/07/23 0500  Heparin level (unfractionated)  Daily,   R      08/06/23 0850   08/07/23 0500  APTT  Daily,   R      08/06/23 0850   08/06/23 1500  APTT  Once-Timed,   TIMED        08/06/23 0850            Vitals/Pain Today's Vitals   08/06/23 0730 08/06/23 0900 08/06/23 0930  08/06/23 1000  BP: (!) 144/77 (!) 141/86 (!) 151/84 137/70  Pulse: 86 (!) 101 95 93  Resp: (!) 25 (!) 24 (!) 27 (!) 27  Temp:      TempSrc:      SpO2: 95% 94% 94% 92%  Weight:      Height:      PainSc:        Isolation Precautions No active isolations  Medications Medications  heparin ADULT infusion 100 units/mL (25000 units/221mL) (1,150 Units/hr Intravenous Rate/Dose Change 08/06/23 0856)  acetaminophen (TYLENOL) tablet 650 mg (has no administration in time range)    Or  acetaminophen (TYLENOL) suppository 650 mg (has no administration in time range)  ondansetron (ZOFRAN) tablet 4 mg (has no administration in time range)    Or  ondansetron (ZOFRAN) injection 4 mg (has no administration in time range)  iohexol (OMNIPAQUE) 350 MG/ML injection 75 mL (75 mLs Intravenous Contrast Given 08/05/23 1950)  furosemide (LASIX) injection 20 mg (20 mg Intravenous Given 08/05/23 2040)    Mobility walks  R Recommendations: See Admitting Provider Note  Report given to:

## 2023-08-06 NOTE — Plan of Care (Signed)

## 2023-08-06 NOTE — Progress Notes (Signed)
 PHARMACY - ANTICOAGULATION CONSULT NOTE  Pharmacy Consult for Heparin Infusion Indication: pulmonary embolus  No Known Allergies  Patient Measurements: Height: 5\' 6"  (167.6 cm) Weight: 70.6 kg (155 lb 9.6 oz) IBW/kg (Calculated) : 59.3 HEPARIN DW (KG): 70.6  Vital Signs: Temp: 99.2 F (37.3 C) (04/04 1347) Temp Source: Oral (04/04 1347) BP: 148/80 (04/04 1347) Pulse Rate: 89 (04/04 1347)  Labs: Recent Labs    08/05/23 1703 08/06/23 0749 08/06/23 1512  HGB 18.1* 14.2  --   HCT 55.7* 44.5  --   PLT 519* 586*  --   APTT  --  68* 41*  HEPARINUNFRC  --  >1.10*  --   CREATININE 1.16* 1.10*  --   TROPONINIHS 29*  --   --     Estimated Creatinine Clearance: 46.5 mL/min (A) (by C-G formula based on SCr of 1.1 mg/dL (H)).   Medical History: Past Medical History:  Diagnosis Date   Hypertension     Medications:  On apixaban 5 mg BID at home, last dose 4/3 AM  Assessment: Patient is a 68 year old female recently diagnosed with a PE, on Eliquis. She presented to ED with SOB and reports being compliant to Eliquis therapy. Pharmacy was consulted to start patient on a heparin infusion. Will monitor via aPTT levels given recent apixaban use.   HL >1.10- falsely elevated due to PTA Eliquis aPTT low  Goal of Therapy:  Heparin level 0.3-0.7 units/ml aPTT 66-102 seconds Monitor platelets by anticoagulation protocol: Yes   Plan:  Heparin infusion to 1350 units/hr Check aPTT in 6 hours and HL daily Monitor CBC daily  Thank you. Okey Regal, PharmD 08/06/2023 4:42 PM

## 2023-08-06 NOTE — Progress Notes (Signed)
  Progress Note   Patient: Betty Zamora ZOX:[id number] DOB: 26-May-1955 DOA: 08/05/2023     1 DOS: the patient was seen and examined on 08/06/2023   Brief hospital course: 68 y.o. female with medical history significant of hypertension, vitamin D deficiency, PE on Eliquis, GERD and chronic kidney disease; who presented to the emergency department due to increasing shortness of breath.  Patient was recently discharged on 3/25 after noted to have acute right lower extremity DVT as well as PE and was discharged on Eliquis.  She is now noted to have interval development of large right pleural effusion with subtotal collapse of the right lung and is in need of thoracentesis.  She will therefore need transfer to [facility].   Assessment and Plan:  Acute right pleural effusion - Chest x-ray personally reviewed showing moderate to severe send effusion with atelectasis/collapse right lung.  [facility] from [facility] secondary to needing thoracentesis (unavailable at that facility).  Will order thoracentesis, unclear if available today or tomorrow.  Will order studies on pleural fluid.  Pulmonary embolism entheses POA) - Diagnosed at previous hospitalization, discharged 3/25 on Eliquis.  Eliquis currently on hold pending possible procedure/thoracentesis.  Chronic HFpEF - Does not appear to be in acute exacerbation although may be contributing to patient's pleural effusion.  No lower extremity edema, BNP similar to previous.  Will monitor closely, IV Lasix as needed.  CKD 3B - Creatinine appears at baseline.  Monitor urine output recheck BMP in AM.  GERD - Protonix on board.        Subjective: Patient resting comfortably this morning, just arrived at [facility] from outside facility approximately an hour ago.  Still a bit short of breath but otherwise denies fever, chills, chest pain, nausea, vomiting, abdominal pain.  Denies any bleeding or coughing up blood.  Physical Exam: Vitals:    08/06/23 1100 08/06/23 1130 08/06/23 1200 08/06/23 1347  BP: (!) 145/80 136/89 (!) 140/83 (!) 148/80  Pulse: 89 91 86 89  Resp: 18 (!) 23 20 18   Temp:    99.2 F (37.3 C)  TempSrc:    Oral  SpO2: 93% 93% 92% 99%  Weight:      Height:       GENERAL:  Alert, pleasant, no acute distress  HEENT:  EOMI CARDIOVASCULAR:  RRR, no murmurs appreciated RESPIRATORY: Poor air movement, dull right side GASTROINTESTINAL:  Soft, nontender, nondistended EXTREMITIES:  No LE edema bilaterally NEURO:  No new focal deficits appreciated SKIN:  No rashes noted PSYCH:  Appropriate mood and affect   Data Reviewed:  Chest x-ray personally reviewed noting moderate right pleural effusion with compression atelectasis of the lung  Family Communication: None at bedside  Disposition: Status is: Inpatient Remains inpatient appropriate because: Thoracentesis for pleural effusion and PE  Planned Discharge Destination: Home    Time spent: 35 minutes  Author: Deanna Artis, DO 08/06/2023 2:48 PM  For on call review [email or web address].

## 2023-08-06 NOTE — Procedures (Addendum)
 PROCEDURE SUMMARY:  Successful US guided diagnostic and therapeutic right thoracentesis. Yielded 1.5 liters of hazy amber fluid. Patient tolerated procedure well. No immediate complications. EBL = trace  Specimen sent for labs.  Post procedure chest X-ray pending for this inpatient.   Satori Krabill NP 08/06/2023 4:41 PM

## 2023-08-06 NOTE — TOC CM/SW Note (Signed)
 Transition of Care [facility]) - Inpatient Brief Assessment   Patient Details  Name: Betty Zamora MRN: [id number] Date of Birth: April 29, 1956  Transition of Care [facility]) CM/SW Contact:    Villa Herb, LCSWA Phone Number: 08/06/2023, 9:10 AM   Clinical Narrative: Transition of Care Department [facility]) has reviewed patient and no TOC needs have been identified at this time. We will continue to monitor patient advancement through interdiciplinary progression rounds. If new patient transition needs arise, please place a TOC consult.   Transition of Care Asessment: Insurance and Status: Insurance coverage has been reviewed Patient has primary care physician: Yes Home environment has been reviewed: From home Prior level of function:: Independent Prior/Current Home Services: No current home services Social Drivers of Health Review: SDOH reviewed no interventions necessary Readmission risk has been reviewed: Yes Transition of care needs: no transition of care needs at this time

## 2023-08-06 NOTE — Progress Notes (Signed)
 PROGRESS NOTE    Betty Zamora  YQI:[id number] DOB: [birth year] DOA: 08/05/2023 PCP: Assunta Found, MD   Brief Narrative:    Betty Zamora is a 68 y.o. female with medical history significant of hypertension, vitamin D deficiency, PE on Eliquis, GERD and chronic kidney disease; who presented to the emergency department due to increasing shortness of breath.  Patient was recently discharged on 3/25 after noted to have acute right lower extremity DVT as well as PE and was discharged on Eliquis.  She is now noted to have interval development of large right pleural effusion with subtotal collapse of the right lung and is in need of thoracentesis.  She will therefore need transfer to [facility].  Assessment & Plan:   Principal Problem:   Pleural effusion on right Active Problems:   Essential hypertension, benign   Pulmonary embolism (HCC)   Chronic kidney disease (CKD)   Thrombocytosis   Elevated troponin   Pulmonary infarct (HCC)  Assessment and Plan:  Pleural effusion on right Patient will require thoracentesis Since patient is on Eliquis, there would be a need for Eliquis washout period which will be on Saturday (last dose was this morning -4/3).  No IR at [facility] on weekend. Patient was admitted to [facility] for possible thoracentesis   History of pulm embolism on Eliquis Pulmonary infarcts Eliquis will be held at this time due to need for thoracentesis Continue Heparin drip and hold prior to thoracentesis   Elevated troponin 2/2 type 2 demand isschemia Troponin x 1 - 29, continue to trend troponin   Chronic diastolic CHF Elevated BNP 107.0 (this was 123.9 about 11 days ago) IV Lasix 20 mg x 1 was given in the ED Continue total input/output, daily weights and fluid restriction Continue heart healthy diet      Echocardiogram done on 07/23/2023 showed LVEF 45 to 70%.  No RWMA.  G1 DD   Thrombocytosis possibly reactive Platelets 519, continue to monitor platelet level    Essential hypertension Continue home meds   CKD 3B Creatinine 1.16 (creatinine is within baseline range) Renally adjust medications, avoid nephrotoxic agents/dehydration/hypotension   GERD Continue Protonix    DVT prophylaxis: Heparin drip Code Status: Full Family Communication: None at bedside Disposition Plan:  Status is: Inpatient Remains inpatient appropriate because: Need for IV medications.   Consultants:  None  Procedures:  None  Antimicrobials:  None   Subjective: Patient seen and evaluated today with no new acute complaints or concerns. No acute concerns or events noted overnight.  Denies any current shortness of breath or chest pain.  Objective: Vitals:   08/06/23 0330 08/06/23 0400 08/06/23 0500 08/06/23 0631  BP: (!) 150/88 (!) 158/93 (!) 159/91   Pulse: 86 87 91   Resp: 20 18 (!) 23   Temp:    98.3 F (36.8 C)  TempSrc:    Oral  SpO2: 93% 93% 94%   Weight:      Height:       No intake or output data in the 24 hours ending 08/06/23 0705 Filed Weights   08/05/23 1508  Weight: 70.6 kg    Examination:  General exam: Appears calm and comfortable  Respiratory system: Clear to auscultation. Respiratory effort normal.  Currently on room air. Cardiovascular system: S1 & S2 heard, RRR.  Gastrointestinal system: Abdomen is soft Central nervous system: Alert and awake Extremities: No edema Skin: No significant lesions noted Psychiatry: Flat affect.    Data Reviewed: I have personally reviewed following labs  and imaging studies  CBC: Recent Labs  Lab 08/05/23 1703  WBC 7.4  NEUTROABS 5.9  HGB 18.1*  HCT 55.7*  MCV 98.9  PLT 519*   Basic Metabolic Panel: Recent Labs  Lab 08/05/23 1703  NA 139  K 5.0  CL 110  CO2 20*  GLUCOSE 100*  BUN 30*  CREATININE 1.16*  CALCIUM 8.9   GFR: Estimated Creatinine Clearance: 44.1 mL/min (A) (by C-G formula based on SCr of 1.16 mg/dL (H)). Liver Function Tests: No results for input(s): "AST",  "ALT", "ALKPHOS", "BILITOT", "PROT", "ALBUMIN" in the last 168 hours. No results for input(s): "LIPASE", "AMYLASE" in the last 168 hours. No results for input(s): "AMMONIA" in the last 168 hours. Coagulation Profile: No results for input(s): "INR", "PROTIME" in the last 168 hours. Cardiac Enzymes: No results for input(s): "CKTOTAL", "CKMB", "CKMBINDEX", "TROPONINI" in the last 168 hours. BNP (last 3 results) No results for input(s): "PROBNP" in the last 8760 hours. HbA1C: No results for input(s): "HGBA1C" in the last 72 hours. CBG: No results for input(s): "GLUCAP" in the last 168 hours. Lipid Profile: No results for input(s): "CHOL", "HDL", "LDLCALC", "TRIG", "CHOLHDL", "LDLDIRECT" in the last 72 hours. Thyroid Function Tests: No results for input(s): "TSH", "T4TOTAL", "FREET4", "T3FREE", "THYROIDAB" in the last 72 hours. Anemia Panel: No results for input(s): "VITAMINB12", "FOLATE", "FERRITIN", "TIBC", "IRON", "RETICCTPCT" in the last 72 hours. Sepsis Labs: No results for input(s): "PROCALCITON", "LATICACIDVEN" in the last 168 hours.  No results found for this or any previous visit (from the past 240 hours).       Radiology Studies: CT Angio Chest PE W/Cm &/Or Wo Cm Result Date: 08/05/2023 CLINICAL DATA:  Pulmonary embolism, progressive dyspnea EXAM: CT ANGIOGRAPHY CHEST WITH CONTRAST TECHNIQUE: Multidetector CT imaging of the chest was performed using the standard protocol during bolus administration of intravenous contrast. Multiplanar CT image reconstructions and MIPs were obtained to evaluate the vascular anatomy. RADIATION DOSE REDUCTION: This exam was performed according to the departmental dose-optimization program which includes automated exposure control, adjustment of the mA and/or kV according to patient size and/or use of iterative reconstruction technique. CONTRAST:  75mL OMNIPAQUE IOHEXOL 350 MG/ML SOLN COMPARISON:  07/22/2023 FINDINGS: Cardiovascular: There is again seen  multiple branching intraluminal filling defects now which demonstrate a more eccentric position within the vessel lumen as well as a more irregular contour in keeping with subacute pulmonary embolism. The embolic burden is moderate, the embolic burden has decreased since prior examination. No interval acute pulmonary embolism identified. Central pulmonary arteries are enlarged in keeping with changes of pulmonary arterial hypertension. Previously noted right heart strain has resolved (RV/LV = 0.8). Global cardiac size is within normal limits. No pericardial effusion. The thoracic aorta is of normal caliber. Mild atherosclerotic calcification. Mediastinum/Nodes: No enlarged mediastinal, hilar, or axillary lymph nodes. Thyroid gland, trachea, and esophagus demonstrate no significant findings. Lungs/Pleura: Large right pleural effusion has developed with subtotal collapse of the right lung. There is non enhancement involving the lateral segment of the right middle lobe in keeping with a pulmonary infarct within this region. Left lung is clear. No pleural effusion on the left. No pneumothorax. Upper Abdomen: No acute abnormality. Musculoskeletal: Remote healed left rib fractures. No acute bone abnormality Review of the MIP images confirms the above findings. IMPRESSION: 1. Subacute pulmonary embolism. Moderate embolic burden, decreased since prior examination. No interval acute pulmonary embolism identified. Resolution of previously noted right heart strain. 2. Interval development of a large right pleural effusion with subtotal collapse  of the right lung. 3. Interval development of a pulmonary infarct involving the lateral segment of the right middle lobe. 4. Morphologic changes in keeping with pulmonary arterial hypertension. Aortic Atherosclerosis (ICD10-I70.0). Electronically Signed   By: Helyn Numbers M.D.   On: 08/05/2023 20:06   DG Chest 2 View Result Date: 08/05/2023 CLINICAL DATA:  Short of breath, right  lower extremity DVT and PE EXAM: CHEST - 2 VIEW COMPARISON:  07/24/2023 FINDINGS: Frontal and lateral views of the chest demonstrate a stable cardiac silhouette. There is progressive opacification of the lower 2/3 of the right hemithorax, compatible with enlarging right pleural effusion and underlying consolidation. This could reflect atelectasis or infarct given known pulmonary embolus. Left chest is clear. No pneumothorax. No acute bony abnormalities. IMPRESSION: 1. Increased opacification right hemithorax consistent with enlarging pleural effusion and progressive right lung consolidation. This could reflect underlying atelectasis or infarct, given patient's known history of pulmonary embolus. Electronically Signed   By: Sharlet Salina M.D.   On: 08/05/2023 17:05        Scheduled Meds: Continuous Infusions:  heparin 1,100 Units/hr (08/05/23 2252)     LOS: 1 day    Time spent: 55 minutes    Sherice Ijames D Sherryll Burger, DO [facility]  If 7PM-7AM, please contact night-coverage [email or web address] 08/06/2023, 7:05 AM

## 2023-08-06 NOTE — Progress Notes (Signed)
 PHARMACY - ANTICOAGULATION CONSULT NOTE  Pharmacy Consult for Heparin Infusion Indication: pulmonary embolus  No Known Allergies  Patient Measurements: Height: 5\' 6"  (167.6 cm) Weight: 70.6 kg (155 lb 9.6 oz) IBW/kg (Calculated) : 59.3 HEPARIN DW (KG): 70.6  Vital Signs: Temp: 98.3 F (36.8 C) (04/04 0631) Temp Source: Oral (04/04 0631) BP: 144/77 (04/04 0730) Pulse Rate: 86 (04/04 0730)  Labs: Recent Labs    08/05/23 1703 08/06/23 0749  HGB 18.1* 14.2  HCT 55.7* 44.5  PLT 519* 586*  APTT  --  68*  HEPARINUNFRC  --  >1.10*  CREATININE 1.16* 1.10*  TROPONINIHS 29*  --     Estimated Creatinine Clearance: 46.5 mL/min (A) (by C-G formula based on SCr of 1.1 mg/dL (H)).   Medical History: Past Medical History:  Diagnosis Date   Hypertension     Medications:  On apixaban 5 mg BID at home, last dose 4/3 AM  Assessment: Patient is a 68 year old female recently diagnosed with a PE, on Eliquis. She presented to ED with SOB and reports being compliant to Eliquis therapy. Pharmacy was consulted to start patient on a heparin infusion. Will monitor via aPTT levels given recent apixaban use.   HL >1.10- falsely elevated due to PTA Eliquis aPTT 68- therapeutic   Goal of Therapy:  Heparin level 0.3-0.7 units/ml aPTT 66-102 seconds Monitor platelets by anticoagulation protocol: Yes   Plan:  Heparin infusion at 1150 units/hr Check aPTT in 6 hours and HL daily Monitor CBC daily  Judeth Cornfield, PharmD Clinical Pharmacist 08/06/2023 8:47 AM

## 2023-08-07 DIAGNOSIS — J90 Pleural effusion, not elsewhere classified: Secondary | ICD-10-CM | POA: Diagnosis not present

## 2023-08-07 LAB — CBC
HCT: 40.6 % (ref 36.0–46.0)
Hemoglobin: 13.5 g/dL (ref 12.0–15.0)
MCH: 32 pg (ref 26.0–34.0)
MCHC: 33.3 g/dL (ref 30.0–36.0)
MCV: 96.2 fL (ref 80.0–100.0)
Platelets: 528 10*3/uL — ABNORMAL HIGH (ref 150–400)
RBC: 4.22 MIL/uL (ref 3.87–5.11)
RDW: 14.1 % (ref 11.5–15.5)
WBC: 6.5 10*3/uL (ref 4.0–10.5)
nRBC: 0 % (ref 0.0–0.2)

## 2023-08-07 LAB — BASIC METABOLIC PANEL WITH GFR
Anion gap: 7 (ref 5–15)
BUN: 33 mg/dL — ABNORMAL HIGH (ref 8–23)
CO2: 18 mmol/L — ABNORMAL LOW (ref 22–32)
Calcium: 8.1 mg/dL — ABNORMAL LOW (ref 8.9–10.3)
Chloride: 109 mmol/L (ref 98–111)
Creatinine, Ser: 1.22 mg/dL — ABNORMAL HIGH (ref 0.44–1.00)
GFR, Estimated: 49 mL/min — ABNORMAL LOW (ref >60)
Glucose, Bld: 109 mg/dL — ABNORMAL HIGH (ref 70–99)
Potassium: 4.5 mmol/L (ref 3.5–5.1)
Sodium: 134 mmol/L — ABNORMAL LOW (ref 135–145)

## 2023-08-07 LAB — HEPARIN LEVEL (UNFRACTIONATED)
Heparin Unfractionated: 0.44 [IU]/mL (ref 0.30–0.70)
Heparin Unfractionated: 0.45 [IU]/mL (ref 0.30–0.70)

## 2023-08-07 LAB — APTT: aPTT: 78 s — ABNORMAL HIGH (ref 24–36)

## 2023-08-07 LAB — MAGNESIUM: Magnesium: 2.1 mg/dL (ref 1.7–2.4)

## 2023-08-07 MED ORDER — FUROSEMIDE 10 MG/ML IJ SOLN
40.0000 mg | Freq: Every day | INTRAMUSCULAR | Status: DC
  Administered 2023-08-07 – 2023-08-09 (×3): 40 mg via INTRAVENOUS
  Filled 2023-08-07 (×3): qty 4

## 2023-08-07 NOTE — Progress Notes (Signed)
  Progress Note   Patient: Betty Zamora ZOX:[id number] DOB: December 12, 1955 DOA: 08/05/2023     2 DOS: the patient was seen and examined on 08/07/2023   Brief hospital course: 68 y.o. female with medical history significant of hypertension, vitamin D deficiency, PE on Eliquis, GERD and chronic kidney disease; who presented to the emergency department due to increasing shortness of breath.  Patient was recently discharged on 3/25 after noted to have acute right lower extremity DVT as well as PE and was discharged on Eliquis.  She is now noted to have interval development of large right pleural effusion with subtotal collapse of the right lung and is in need of thoracentesis.  She will therefore need transfer to [facility].   Assessment and Plan:  Acute right pleural effusion - Chest x-ray personally reviewed showing moderate to severe send effusion with atelectasis/collapse right lung.  Transfer from [facility] hospital secondary to needing thoracentesis (unavailable at that facility).  S/P thoracentesis 4/4 with 1.5L hazy amber fluid removed.  Repeat chest x-ray personally reviewed noting improvement in lung expansion but with residual effusion.  Studies noting elevated LDH, neutrophil count.  Culture pending.  Likely related to patient's new PE/pulmonary infarcts rather than infection or other insidious malignant etiology.  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  Repeat CT angio noting no new PE, continued embolic burden, but with new right infarct.  Currently on heparin drip.  S/p thoracentesis as above.  Will monitor respiratory closely.  Currently not hypoxic.  Chronic HFpEF - Does not appear to be in acute exacerbation although may be contributing to patient's pleural effusion.  No lower extremity edema, BNP similar to previous.  Will initiate IV Lasix 40 mg daily.  Monitor urine output recheck BMP in AM.  CKD 3B -  Creatinine appears at baseline.  Monitor urine output recheck BMP in AM.  GERD - Protonix on board.      Subjective: Patient resting comfortably this morning.  Feels improved from yesterday after having her thoracentesis.  Still complaining of sharp intermittent chest pain in her anterior and lateral portions of her right chest.  Denies fever, chills, chest pain, nausea, vomiting, abdominal pain.  Denies any bleeding or coughing up blood.  Physical Exam: Vitals:   08/06/23 1934 08/07/23 0014 08/07/23 0439 08/07/23 0812  BP: 137/74 130/75 136/77 135/78  Pulse: 82 75 75 75  Resp: 18 18 18 18   Temp: 98.4 F (36.9 C) 98.6 F (37 C) 98.6 F (37 C) 99.2 F (37.3 C)  TempSrc: Oral Oral Oral Oral  SpO2: 100% 100% 98% 99%  Weight:      Height:       GENERAL:  Alert, pleasant, no acute distress  HEENT:  EOMI CARDIOVASCULAR:  RRR, no murmurs appreciated RESPIRATORY: Poor air movement GASTROINTESTINAL:  Soft, nontender, nondistended EXTREMITIES:  No LE edema bilaterally NEURO:  No new focal deficits appreciated SKIN:  No rashes noted PSYCH:  Appropriate mood and affect   Data Reviewed:  Chest x-ray personally reviewed noting moderate right pleural effusion with compression atelectasis of the lung  Family Communication: None at bedside  Disposition: Status is: Inpatient Remains inpatient appropriate because: Thoracentesis for pleural effusion and PE  Planned Discharge Destination: Home    Time spent: 34 minutes  Author: Deanna Artis, DO 08/07/2023 1:26 PM  For on call review [email or web address].

## 2023-08-07 NOTE — Progress Notes (Signed)
 PHARMACY - ANTICOAGULATION CONSULT NOTE  Pharmacy Consult for Heparin Infusion Indication: pulmonary embolus  No Known Allergies  Patient Measurements: Height: 5\' 6"  (167.6 cm) Weight: 70.6 kg (155 lb 9.6 oz) IBW/kg (Calculated) : 59.3 HEPARIN DW (KG): 70.6  Vital Signs: Temp: 98.6 F (37 C) (04/05 0014) Temp Source: Oral (04/05 0014) BP: 130/75 (04/05 0014) Pulse Rate: 75 (04/05 0014)  Labs: Recent Labs    08/05/23 1703 08/06/23 0749 08/06/23 1512 08/06/23 2348  HGB 18.1* 14.2  --  13.5  HCT 55.7* 44.5  --  40.6  PLT 519* 586*  --  528*  APTT  --  68* 41* 78*  HEPARINUNFRC  --  >1.10*  --  0.44  CREATININE 1.16* 1.10*  --  1.22*  TROPONINIHS 29*  --   --   --     Estimated Creatinine Clearance: 41.9 mL/min (A) (by C-G formula based on SCr of 1.22 mg/dL (H)).   Medical History: Past Medical History:  Diagnosis Date   Hypertension     Medications:  On apixaban 5 mg BID at home, last dose 4/3 AM  Assessment: Patient is a 68 year old female recently diagnosed with a PE, on Eliquis. She presented to ED with SOB and reports being compliant to Eliquis therapy. Pharmacy was consulted to start patient on a heparin infusion. Will monitor via aPTT levels given recent apixaban use.   HL >1.10- falsely elevated due to PTA Eliquis aPTT low  4/5 AM update:  Heparin level therapeutic after rate increase aPTT/HL are correlating-will DC further aPTTs  Goal of Therapy:  Heparin level 0.3-0.7 units/mL Monitor platelets by anticoagulation protocol: Yes   Plan:  Cont heparin 1350 units/hr Heparin level in 8 hours  Abran Duke, PharmD, BCPS Clinical Pharmacist Phone: [phone]

## 2023-08-07 NOTE — Progress Notes (Signed)
 PHARMACY - ANTICOAGULATION CONSULT NOTE  Pharmacy Consult for Heparin Infusion Indication: pulmonary embolus  No Known Allergies  Patient Measurements: Height: 5\' 6"  (167.6 cm) Weight: 70.6 kg (155 lb 9.6 oz) IBW/kg (Calculated) : 59.3 HEPARIN DW (KG): 70.6  Vital Signs: Temp: 99.2 F (37.3 C) (04/05 0812) Temp Source: Oral (04/05 0812) BP: 135/78 (04/05 0812) Pulse Rate: 75 (04/05 0812)  Labs: Recent Labs    08/05/23 1703 08/06/23 0749 08/06/23 1512 08/06/23 2348 08/07/23 1131  HGB 18.1* 14.2  --  13.5  --   HCT 55.7* 44.5  --  40.6  --   PLT 519* 586*  --  528*  --   APTT  --  68* 41* 78*  --   HEPARINUNFRC  --  >1.10*  --  0.44 0.45  CREATININE 1.16* 1.10*  --  1.22*  --   TROPONINIHS 29*  --   --   --   --     Estimated Creatinine Clearance: 41.9 mL/min (A) (by C-G formula based on SCr of 1.22 mg/dL (H)).   Medical History: Past Medical History:  Diagnosis Date   Hypertension     Medications:  On apixaban 5 mg BID at home, last dose 4/3 AM  Assessment: Patient is a 68 year old female recently diagnosed with a PE, on Eliquis. She presented to ED with SOB and reports being compliant to Eliquis therapy. Pharmacy was consulted to start patient on a heparin infusion.   Confirmatory heparin level therapeutic at 0.45 on 1350 units/hr. CBC stable, no signs of bleeding noted.   Goal of Therapy:  Heparin level 0.3-0.7 units/mL Monitor platelets by anticoagulation protocol: Yes   Plan:  Cont heparin 1350 units/hr Daily heparin level and CBC  Monitor for s/sx of bleeding  F/u long term AC plans  Thank you for involving pharmacy in the patient's care.   Theotis Burrow, PharmD PGY1 Acute Care Pharmacy Resident  08/07/2023 12:23 PM

## 2023-08-08 ENCOUNTER — Inpatient Hospital Stay (HOSPITAL_COMMUNITY)

## 2023-08-08 DIAGNOSIS — J90 Pleural effusion, not elsewhere classified: Secondary | ICD-10-CM | POA: Diagnosis not present

## 2023-08-08 LAB — BASIC METABOLIC PANEL WITH GFR
Anion gap: 7 (ref 5–15)
BUN: 33 mg/dL — ABNORMAL HIGH (ref 8–23)
CO2: 21 mmol/L — ABNORMAL LOW (ref 22–32)
Calcium: 8.4 mg/dL — ABNORMAL LOW (ref 8.9–10.3)
Chloride: 110 mmol/L (ref 98–111)
Creatinine, Ser: 1.17 mg/dL — ABNORMAL HIGH (ref 0.44–1.00)
GFR, Estimated: 51 mL/min — ABNORMAL LOW (ref >60)
Glucose, Bld: 96 mg/dL (ref 70–99)
Potassium: 5.1 mmol/L (ref 3.5–5.1)
Sodium: 138 mmol/L (ref 135–145)

## 2023-08-08 LAB — CBC
HCT: 41.9 % (ref 36.0–46.0)
Hemoglobin: 13.9 g/dL (ref 12.0–15.0)
MCH: 32.3 pg (ref 26.0–34.0)
MCHC: 33.2 g/dL (ref 30.0–36.0)
MCV: 97.2 fL (ref 80.0–100.0)
Platelets: 502 10*3/uL — ABNORMAL HIGH (ref 150–400)
RBC: 4.31 MIL/uL (ref 3.87–5.11)
RDW: 13.5 % (ref 11.5–15.5)
WBC: 8.8 10*3/uL (ref 4.0–10.5)
nRBC: 0 % (ref 0.0–0.2)

## 2023-08-08 LAB — HEPARIN LEVEL (UNFRACTIONATED)
Heparin Unfractionated: 0.17 [IU]/mL — ABNORMAL LOW (ref 0.30–0.70)
Heparin Unfractionated: 0.26 [IU]/mL — ABNORMAL LOW (ref 0.30–0.70)

## 2023-08-08 LAB — MAGNESIUM: Magnesium: 2.3 mg/dL (ref 1.7–2.4)

## 2023-08-08 MED ORDER — KETOROLAC TROMETHAMINE 15 MG/ML IJ SOLN
15.0000 mg | Freq: Four times a day (QID) | INTRAMUSCULAR | Status: AC
  Administered 2023-08-08 – 2023-08-09 (×4): 15 mg via INTRAVENOUS
  Filled 2023-08-08 (×5): qty 1

## 2023-08-08 NOTE — Plan of Care (Signed)

## 2023-08-08 NOTE — Progress Notes (Signed)
 PHARMACY - ANTICOAGULATION CONSULT NOTE  Pharmacy Consult for Heparin Infusion Indication: pulmonary embolus  No Known Allergies  Patient Measurements: Height: 5\' 6"  (167.6 cm) Weight: 70.6 kg (155 lb 9.6 oz) IBW/kg (Calculated) : 59.3 HEPARIN DW (KG): 70.6  Vital Signs:    Labs: Recent Labs    08/06/23 0749 08/06/23 1512 08/06/23 2348 08/07/23 1131 08/08/23 0828 08/08/23 1605  HGB 14.2  --  13.5  --  13.9  --   HCT 44.5  --  40.6  --  41.9  --   PLT 586*  --  528*  --  502*  --   APTT 68* 41* 78*  --   --   --   HEPARINUNFRC >1.10*  --  0.44 0.45 0.17* 0.26*  CREATININE 1.10*  --  1.22*  --  1.17*  --     Estimated Creatinine Clearance: 43.7 mL/min (A) (by C-G formula based on SCr of 1.17 mg/dL (H)).   Medical History: Past Medical History:  Diagnosis Date   Hypertension     Medications:  On apixaban 5 mg BID at home, last dose 4/3 AM  Assessment: Patient is a 68 year old female recently diagnosed with a PE, on Eliquis. She presented to ED with SOB and reports being compliant to Eliquis therapy. Pharmacy was consulted to start patient on a heparin infusion.   Heparin level uptrend to slightly subtherapeutic s/p rate increase to 1600 units/hr  Goal of Therapy:  Heparin level 0.3-0.7 units/mL Monitor platelets by anticoagulation protocol: Yes   Plan:  Increase heparin gtt to 1750 units/hr F/u 6 hour heparin level  Daylene Posey, PharmD, [facility] Clinical Pharmacist ED Pharmacist Phone # [phone] 08/08/2023 6:04 PM

## 2023-08-08 NOTE — Progress Notes (Signed)
 PHARMACY - ANTICOAGULATION CONSULT NOTE  Pharmacy Consult for Heparin Infusion Indication: pulmonary embolus  No Known Allergies  Patient Measurements: Height: 5\' 6"  (167.6 cm) Weight: 70.6 kg (155 lb 9.6 oz) IBW/kg (Calculated) : 59.3 HEPARIN DW (KG): 70.6  Vital Signs: Temp: 98.8 F (37.1 C) (04/06 0455) Temp Source: Oral (04/06 0455) BP: 151/84 (04/06 0455) Pulse Rate: 81 (04/06 0455)  Labs: Recent Labs    08/05/23 1703 08/05/23 1703 08/06/23 0749 08/06/23 1512 08/06/23 2348 08/07/23 1131 08/08/23 0828  HGB 18.1*  --  14.2  --  13.5  --  13.9  HCT 55.7*  --  44.5  --  40.6  --  41.9  PLT 519*  --  586*  --  528*  --  502*  APTT  --   --  68* 41* 78*  --   --   HEPARINUNFRC  --    < > >1.10*  --  0.44 0.45 0.17*  CREATININE 1.16*  --  1.10*  --  1.22*  --   --   TROPONINIHS 29*  --   --   --   --   --   --    < > = values in this interval not displayed.    Estimated Creatinine Clearance: 41.9 mL/min (A) (by C-G formula based on SCr of 1.22 mg/dL (H)).   Medical History: Past Medical History:  Diagnosis Date   Hypertension     Medications:  On apixaban 5 mg BID at home, last dose 4/3 AM  Assessment: Patient is a 68 year old female recently diagnosed with a PE, on Eliquis. She presented to ED with SOB and reports being compliant to Eliquis therapy. Pharmacy was consulted to start patient on a heparin infusion.   AM heparin level subtherapeutic at 0.17 on 1350 units/hr despite previously therapeutic levels. Per RN, no issues with infusion running continuously and no signs of bleeding.   Goal of Therapy:  Heparin level 0.3-0.7 units/mL Monitor platelets by anticoagulation protocol: Yes   Plan:  Inc heparin to 1600 units/hr 6 hour heparin level  Daily heparin level and CBC  Monitor for s/sx of bleeding  F/u long term AC plans  Thank you for involving pharmacy in the patient's care.   Theotis Burrow, PharmD PGY1 Acute Care Pharmacy Resident   08/08/2023 9:21 AM

## 2023-08-08 NOTE — Progress Notes (Signed)
 Progress Note   Patient: Betty Zamora:[id number] DOB: August 01, 1955 DOA: 08/05/2023     3 DOS: the patient was seen and examined on 08/08/2023   Brief hospital course: 68 y.o. female with medical history significant of hypertension, vitamin D deficiency, PE on Eliquis, GERD and chronic kidney disease; who presented to the emergency department due to increasing shortness of breath.  Patient was recently discharged on 3/25 after noted to have acute right lower extremity DVT as well as PE and was discharged on Eliquis.  She is now noted to have interval development of large right pleural effusion with subtotal collapse of the right lung and is in need of thoracentesis.  She will therefore need transfer to [facility].   Assessment and Plan:  Acute right pleural effusion - Chest x-ray personally reviewed showing moderate to severe send effusion with atelectasis/collapse right lung.  [facility] secondary to needing thoracentesis (unavailable at that facility).  S/P thoracentesis 4/4 with 1.5L hazy amber fluid removed.  Postprocedure chest x-ray personally reviewed noting improvement in lung expansion but with residual effusion.  Studies noting elevated LDH, neutrophil count.  Culture pending.  Likely related to patient's new PE/pulmonary infarcts rather than infection or other insidious malignant etiology.  Patient still complaining of some fullness and intermittent sharp pleuritic type pain on the right.  Will initiate on IV Toradol.  Will repeat Pete chest x-ray today to evaluate effusion.  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  Repeat CT angio noting no new PE, continued embolic burden, but with new right infarct.  Currently on heparin drip.  S/p thoracentesis as above.  Will monitor respiratory closely.  Currently not hypoxic.  Will transition back to Eliquis if effusion appears stable/improving and  there is no threat of repeat thoracentesis  Chronic HFpEF - Does not appear to be in acute exacerbation although may be contributing to patient's pleural effusion.  No lower extremity edema, BNP similar to previous.  Continue IV Lasix 40 mg daily.  Monitor urine output recheck BMP in AM.  CKD 3B - Creatinine appears at baseline.  Monitor urine output recheck BMP in AM.  GERD - Protonix on board.      Subjective: Patient resting comfortably this morning.  Complaining of sharp intermittent chest pain in her anterior and lateral portions of her right chest again today.  Also admitting that her breath gets halted when she tries to inhale deeply.  Denies fever, chills, chest pain, nausea, vomiting, abdominal pain.  Denies any bleeding or coughing up blood.  Physical Exam: Vitals:   08/07/23 1200 08/07/23 1406 08/07/23 2018 08/08/23 0455  BP:  (!) 148/81 (!) 155/83 (!) 151/84  Pulse:  83 78 81  Resp:  18 18 16   Temp:  99.1 F (37.3 C) 99.8 F (37.7 C) 98.8 F (37.1 C)  TempSrc:  Oral Oral Oral  SpO2: 96% 95% 97% 96%  Weight:      Height:       GENERAL:  Alert, pleasant, mild acute distress  HEENT:  EOMI CARDIOVASCULAR:  RRR, no murmurs appreciated RESPIRATORY: Poor air movement GASTROINTESTINAL:  Soft, nontender, nondistended EXTREMITIES:  No LE edema bilaterally NEURO:  No new focal deficits appreciated SKIN:  No rashes noted PSYCH:  Appropriate mood and affect   Data Reviewed:  Chest x-ray personally reviewed noting moderate right pleural effusion with compression atelectasis of the lung  Family Communication: None at bedside  Disposition: Status  is: Inpatient Remains inpatient appropriate because: Thoracentesis for pleural effusion and PE  Planned Discharge Destination: Home    Time spent: 36 minutes  Author: Deanna Artis, DO 08/08/2023 11:01 AM  For on call review [email or web address].

## 2023-08-09 ENCOUNTER — Inpatient Hospital Stay (HOSPITAL_COMMUNITY)

## 2023-08-09 DIAGNOSIS — J90 Pleural effusion, not elsewhere classified: Secondary | ICD-10-CM | POA: Diagnosis not present

## 2023-08-09 LAB — MAGNESIUM: Magnesium: 2.1 mg/dL (ref 1.7–2.4)

## 2023-08-09 LAB — CBC
HCT: 39.2 % (ref 36.0–46.0)
Hemoglobin: 13.1 g/dL (ref 12.0–15.0)
MCH: 31.9 pg (ref 26.0–34.0)
MCHC: 33.4 g/dL (ref 30.0–36.0)
MCV: 95.4 fL (ref 80.0–100.0)
Platelets: 487 10*3/uL — ABNORMAL HIGH (ref 150–400)
RBC: 4.11 MIL/uL (ref 3.87–5.11)
RDW: 13.6 % (ref 11.5–15.5)
WBC: 9.2 10*3/uL (ref 4.0–10.5)
nRBC: 0 % (ref 0.0–0.2)

## 2023-08-09 LAB — BASIC METABOLIC PANEL WITH GFR
Anion gap: 7 (ref 5–15)
BUN: 34 mg/dL — ABNORMAL HIGH (ref 8–23)
CO2: 19 mmol/L — ABNORMAL LOW (ref 22–32)
Calcium: 8.1 mg/dL — ABNORMAL LOW (ref 8.9–10.3)
Chloride: 110 mmol/L (ref 98–111)
Creatinine, Ser: 1.17 mg/dL — ABNORMAL HIGH (ref 0.44–1.00)
GFR, Estimated: 51 mL/min — ABNORMAL LOW (ref >60)
Glucose, Bld: 112 mg/dL — ABNORMAL HIGH (ref 70–99)
Potassium: 4.3 mmol/L (ref 3.5–5.1)
Sodium: 136 mmol/L (ref 135–145)

## 2023-08-09 LAB — HEPARIN LEVEL (UNFRACTIONATED)
Heparin Unfractionated: 0.51 [IU]/mL (ref 0.30–0.70)
Heparin Unfractionated: 0.51 [IU]/mL (ref 0.30–0.70)

## 2023-08-09 LAB — PATHOLOGIST SMEAR REVIEW

## 2023-08-09 MED ORDER — LIDOCAINE HCL 1 % IJ SOLN
10.0000 mL | Freq: Once | INTRAMUSCULAR | Status: AC
  Administered 2023-08-09: 8 mL via INTRADERMAL

## 2023-08-09 NOTE — Plan of Care (Signed)

## 2023-08-09 NOTE — H&P (View-Only) (Signed)
 NAME:  Betty Zamora, MRN:  [id number], DOB:  Sep 07, 1955, LOS: 4 ADMISSION DATE:  08/05/2023, CONSULTATION DATE:  08/09/23 REFERRING MD:  Dr. Sharlene Dory, CHIEF COMPLAINT:  pleural effusion   History of Present Illness:   13 yoF with PMH of tobacco abuse, HTN, HFpEF, GERD, CKD and recent PE/ RLE DVT with pulmonary infarction requiring hospitalization 3/20> 3/25 discharged home on Eliquis.  Pt states she has had unchanged exertional dyspnea with some orthopnea since her first admit.  Repeat CT chest w/ contrast showed decreased in embolic burden but development of large right pleural effusion with subtotal collapse of right lung. Was admitted to [facility] and started on diureses and transitioned to heparin gtt.  Underwent IR thoracentesis on 4/4 with 1.5L of hazy amber fluid removed with some improvement on expansion noted on CXR, noted elevated LDH and neutrophil predominant, with culture ngtd.  Ongoing dyspnea, with repeat CXR showing increased effusion.  Back to IR 4/7 for repeat thora, drained only and noted to have loculations.  No hypoxia, currently on room air, afebrile, normal WBC.  PCCM called for further evaluation and treatment.    Pt reports reports BLE x 3 months but improved this admit after lasix.  Ongoing easily exertional dyspnea.   Denies any recent fever, chills, cough, sputum production, chest pain, or unintentional weight loss.  Quit smoking on her last admit, 37 year hx, 1 pack per week.  Was working full time in factory standing for 12hrs until recently but does admit to less activity given progressive BLE edema.   Pertinent  Medical History  HTN, recent PE/ RLE DVT on Eliquis, HFpEF, GERD, CKD IIIb, vit D  Significant Hospital Events: Including procedures, antibiotic start and stop dates in addition to other pertinent events   Recent admit 3/20-3/25 for PE w/ pulmonary infarction, RLE DVT d/c on Eliquis Admit TRH 4/4 large right pleural effusion, IR R thora 1.5L 4/7 IR right  thora 100 ml, noted loculations   Interim History / Subjective:   Objective   Blood pressure 125/89, pulse 83, temperature 98.6 F (37 C), temperature source Oral, resp. rate 18, height 5\' 6"  (1.676 m), weight 70.6 kg, SpO2 96%.        Intake/Output Summary (Last 24 hours) at 08/09/2023 1348 Last data filed at 08/09/2023 0500 Gross per 24 hour  Intake 932.69 ml  Output --  Net 932.69 ml   Filed Weights   08/05/23 1508  Weight: 70.6 kg    Examination: General:  pleasant older female sitting upright in bed in NAD HEENT: MM pink/moist, no JVD Neuro: Aox4, MAE CV: rr, no murmur PULM:  non labored, no significant dyspnea, clear left, diminished right lung posteriorly with few crackles mid posterior GI: soft, bs+, NT Extremities: warm/dry, R>L +1 ankle edema  Skin: no rashes     Resolved Hospital Problem list    Assessment & Plan:   Right loculated pleural effusion - septations noted on bedside US as above, more posteriorly noted - exudate by LDH, suspect related to recent PE.  No hx of infectious symptoms and continues to accumulate despite diuresis with HFpEF, BNP 107.  Malignancy can not be excluded given risk factors.   P:  - currently on room air, afebrile, otherwise hemodynamically stable  - plans to place pigtail chest tube 4/8 in Endo at 1400 w/ PCCM.  Will need to hold heparin at noon, 2hrs prior to procedure  - will try and add GS, culture and cytology to IR's fluid  today - likely will need pleural lytics depending on pleural drainage and imaging, to be discussed with attending, given ongoing requirements for anticoagulation with recent DVT/ PE, high risk for bleeding w/pleural lytics - cont to follow cytology and pleural cultures - will need f/u CT chest w/o contrast after right pleural space drained   Remainder per primary team, PCCM will continue to see.   Best Practice (right click and "Reselect all SmartList Selections" daily)  Per primary team  Labs    CBC: Recent Labs  Lab 08/05/23 1703 08/06/23 0749 08/06/23 2348 08/08/23 0828 08/09/23 0121  WBC 7.4 8.3 6.5 8.8 9.2  NEUTROABS 5.9  --   --   --   --   HGB 18.1* 14.2 13.5 13.9 13.1  HCT 55.7* 44.5 40.6 41.9 39.2  MCV 98.9 99.8 96.2 97.2 95.4  PLT 519* 586* 528* 502* 487*    Basic Metabolic Panel: Recent Labs  Lab 08/05/23 1703 08/06/23 0749 08/06/23 2348 08/08/23 0828 08/09/23 0121  NA 139 137 134* 138 136  K 5.0 4.9 4.5 5.1 4.3  CL 110 109 109 110 110  CO2 20* 20* 18* 21* 19*  GLUCOSE 100* 84 109* 96 112*  BUN 30* 32* 33* 33* 34*  CREATININE 1.16* 1.10* 1.22* 1.17* 1.17*  CALCIUM 8.9 8.5* 8.1* 8.4* 8.1*  MG  --  2.2 2.1 2.3 2.1  PHOS  --  3.8  --   --   --    GFR: Estimated Creatinine Clearance: 43.7 mL/min (A) (by C-G formula based on SCr of 1.17 mg/dL (H)). Recent Labs  Lab 08/06/23 0749 08/06/23 2348 08/08/23 0828 08/09/23 0121  WBC 8.3 6.5 8.8 9.2    Liver Function Tests: Recent Labs  Lab 08/06/23 0749  AST 14*  ALT 13  ALKPHOS 84  BILITOT 0.6  PROT 5.3*  ALBUMIN <1.5*   No results for input(s): "LIPASE", "AMYLASE" in the last 168 hours. No results for input(s): "AMMONIA" in the last 168 hours.  ABG No results found for: "PHART", "PCO2ART", "PO2ART", "HCO3", "TCO2", "ACIDBASEDEF", "O2SAT"   Coagulation Profile: No results for input(s): "INR", "PROTIME" in the last 168 hours.  Cardiac Enzymes: No results for input(s): "CKTOTAL", "CKMB", "CKMBINDEX", "TROPONINI" in the last 168 hours.  HbA1C: No results found for: "HGBA1C"  CBG: No results for input(s): "GLUCAP" in the last 168 hours.  Review of Systems:   Review of Systems  Constitutional:  Positive for malaise/fatigue. Negative for chills, fever and weight loss.  HENT:  Negative for sore throat.   Respiratory:  Positive for shortness of breath. Negative for cough, hemoptysis, sputum production and wheezing.   Cardiovascular:  Positive for orthopnea and leg swelling. Negative for  chest pain.  Neurological:  Negative for dizziness and focal weakness.   Past Medical History:  She,  has a past medical history of Hypertension.   Surgical History:   Past Surgical History:  Procedure Laterality Date   ABDOMINAL HYSTERECTOMY     IR THORACENTESIS ASP PLEURAL SPACE W/IMG GUIDE  08/06/2023   IR THORACENTESIS ASP PLEURAL SPACE W/IMG GUIDE  08/09/2023     Social History:   reports that she has quit smoking. Her smoking use included cigarettes. She has never used smokeless tobacco. She reports current alcohol use. She reports that she does not use drugs.   Family History:  Her family history is not on file.   Allergies No Known Allergies   Home Medications  Prior to Admission medications   Medication Sig Start  Date End Date Taking? Authorizing Provider  acetaminophen (TYLENOL) 325 MG tablet Take 2 tablets (650 mg total) by mouth every 6 (six) hours as needed for mild pain (pain score 1-3) (or Fever >/= 101). 07/27/23  Yes Elgergawy, Leana Roe, MD  APIXABAN (ELIQUIS) VTE STARTER PACK (10MG  AND 5MG ) Take as directed on package: start with two-5mg  tablets twice daily for 5 days. On day 6, switch to one-5mg  tablet twice daily on 07/31/2023. 07/27/23  Yes Elgergawy, Leana Roe, MD  olmesartan (BENICAR) 40 MG tablet Take 40 mg by mouth daily. 07/14/23  Yes [provider]  pantoprazole (PROTONIX) 40 MG tablet Take 1 tablet (40 mg total) by mouth daily. Patient not taking: Reported on 08/05/2023 07/27/23   Elgergawy, Leana Roe, MD     Critical care time: n/a       Posey Boyer, MSN, AG-ACNP-BC [facility] 08/09/2023, 3:39 PM  See Amion for pager If no response to pager , please call [phone] until 7pm After 7:00 pm call Elink  [phone]

## 2023-08-09 NOTE — Progress Notes (Signed)
 Attempt at thoracentesis pulling only of fluid, but noting loculations on [place].  Given loculations, likely pt may need ct with tpase,etc.. Will attempt to consult pulm for evaluation.

## 2023-08-09 NOTE — Procedures (Signed)
 PROCEDURE SUMMARY:  Successful image-guided right thoracentesis. Yielded 100 mL of amber fluid. Patient tolerated procedure well. EBL: Zero No immediate complications.  Specimen was not sent for labs. Post procedure CXR shows no pneumothorax.  Please see imaging section of Epic for full dictation.  Janifer Adie Caden Fatica PA-C 08/09/2023 12:58 PM

## 2023-08-09 NOTE — Progress Notes (Signed)
 PHARMACY - ANTICOAGULATION CONSULT NOTE  Pharmacy Consult for Heparin Infusion Indication: pulmonary embolus  No Known Allergies  Patient Measurements: Height: 5\' 6"  (167.6 cm) Weight: 70.6 kg (155 lb 9.6 oz) IBW/kg (Calculated) : 59.3 HEPARIN DW (KG): 70.6  Vital Signs: Temp: 99.2 F (37.3 C) (04/06 1952) Temp Source: Oral (04/06 1952) BP: 148/85 (04/06 1952) Pulse Rate: 84 (04/06 1952)  Labs: Recent Labs    08/06/23 0749 08/06/23 1512 08/06/23 2348 08/07/23 1131 08/08/23 0828 08/08/23 1605 08/09/23 0121  HGB 14.2  --  13.5  --  13.9  --  13.1  HCT 44.5  --  40.6  --  41.9  --  39.2  PLT 586*  --  528*  --  502*  --  487*  APTT 68* 41* 78*  --   --   --   --   HEPARINUNFRC >1.10*  --  0.44   < > 0.17* 0.26* 0.51  CREATININE 1.10*  --  1.22*  --  1.17*  --  1.17*   < > = values in this interval not displayed.    Estimated Creatinine Clearance: 43.7 mL/min (A) (by C-G formula based on SCr of 1.17 mg/dL (H)).   Medical History: Past Medical History:  Diagnosis Date   Hypertension     Medications:  On apixaban 5 mg BID at home, last dose 4/3 AM  Assessment: Patient is a 68 year old female recently diagnosed with a PE, on Eliquis. She presented to ED with SOB and reports being compliant to Eliquis therapy. Pharmacy was consulted to start patient on a heparin infusion. Will monitor via aPTT levels given recent apixaban use.   HL >1.10- falsely elevated due to PTA Eliquis aPTT low  4/7 AM update:  Heparin level therapeutic after rate increase  Goal of Therapy:  Heparin level 0.3-0.7 units/mL Monitor platelets by anticoagulation protocol: Yes   Plan:  Cont heparin 1750 units/hr Heparin level in 8 hours  Abran Duke, PharmD, BCPS Clinical Pharmacist Phone: [phone]

## 2023-08-09 NOTE — Progress Notes (Signed)
 Progress Note   Patient: Betty Zamora QIO:[id number] DOB: 11-Jun-1955 DOA: 08/05/2023     4 DOS: the patient was seen and examined on 08/09/2023   Brief hospital course: 68 y.o. female with medical history significant of hypertension, vitamin D deficiency, PE on Eliquis, GERD and chronic kidney disease; who presented to the emergency department due to increasing shortness of breath.  Patient was recently discharged on 3/25 after noted to have acute right lower extremity DVT as well as PE and was discharged on Eliquis.  She is now noted to have interval development of large right pleural effusion with subtotal collapse of the right lung and is in need of thoracentesis.  She will therefore need transfer to [facility].   Assessment and Plan:  Acute right pleural effusion - Chest x-ray personally reviewed showing moderate to severe send effusion with atelectasis/collapse right lung.  [facility] from [facility] secondary to needing thoracentesis (unavailable at that facility).  S/P thoracentesis 4/4 with 1.5L hazy amber fluid removed.  Postprocedure chest x-ray personally reviewed noting improvement in lung expansion but with residual effusion.  Studies noting elevated LDH, neutrophil count.  Culture pending.  Likely related to patient's new PE/pulmonary infarcts rather than infection or other insidious malignant etiology.  Patient still complaining of some fullness and shortness of breath.  Fortunately patient's pain has been nearly eliminated after initiation of IV Toradol.  Repeat chest x-ray personally reviewed shows no improvement in residual effusion, possible even enlarging since previous chest x-ray.  Given ongoing dyspnea, will order repeat thoracentesis and attempt to remove remaining pleural fluid.  If patient's pain is resolved, and subsequently pleural fluid removed and dyspnea resolved, can anticipate discharge tomorrow.  Pulmonary embolism (POA) with pulmonary infarct (new) - Diagnosed at  previous hospitalization, discharged 3/25 on Eliquis.  Repeat CT angio noting no new PE, continued embolic burden, but with new right infarct.  Currently on heparin drip.  S/p thoracentesis as above.  Will monitor respiratory closely.  Currently not hypoxic.  Will transition back to Eliquis if effusion appears stable/improving after repeat thoracentesis.  Chronic HFpEF - Does not appear to be in acute exacerbation although may be contributing to patient's pleural effusion.  No lower extremity edema, BNP similar to previous.  Continue IV Lasix 40 mg daily.  Monitor urine output recheck BMP in AM.  CKD 3B - Creatinine appears at baseline.  Monitor urine output recheck BMP in AM.  GERD - Protonix on board.      Subjective: Patient resting comfortably this morning.  States that her chest pain is markedly improved, nearly resolved this morning.  Still complaining of fullness and shortness of breath however.  Overall feels improved.  Denies fever, chills, chest pain, nausea, vomiting, abdominal pain.  Denies any bleeding or coughing up blood.  Physical Exam: Vitals:   08/07/23 2018 08/08/23 0455 08/08/23 1952 08/09/23 0423  BP: (!) 155/83 (!) 151/84 (!) 148/85 125/89  Pulse: 78 81 84 83  Resp: 18 16 19 18   Temp: 99.8 F (37.7 C) 98.8 F (37.1 C) 99.2 F (37.3 C) 98.6 F (37 C)  TempSrc: Oral Oral Oral Oral  SpO2: 97% 96% 95% 96%  Weight:      Height:       GENERAL:  Alert, pleasant, mild acute distress  HEENT:  EOMI CARDIOVASCULAR:  RRR, no murmurs appreciated RESPIRATORY: Poor air movement, dull on right base GASTROINTESTINAL:  Soft, nontender, nondistended EXTREMITIES:  No LE edema bilaterally NEURO:  No new focal deficits appreciated  SKIN:  No rashes noted PSYCH:  Appropriate mood and affect   Data Reviewed:  Chest x-ray personally reviewed noting moderate right pleural effusion with compression atelectasis of the lung  Family Communication: None at  bedside  Disposition: Status is: Inpatient Remains inpatient appropriate because: Thoracentesis for pleural effusion and PE  Planned Discharge Destination: Home    Time spent: 35 minutes  Author: Deanna Artis, DO 08/09/2023 11:44 AM  For on call review [email or web address].

## 2023-08-09 NOTE — Progress Notes (Signed)
 PHARMACY - ANTICOAGULATION CONSULT NOTE  Pharmacy Consult for Heparin Infusion Indication: pulmonary embolus  No Known Allergies  Patient Measurements: Height: 5\' 6"  (167.6 cm) Weight: 70.6 kg (155 lb 9.6 oz) IBW/kg (Calculated) : 59.3 HEPARIN DW (KG): 70.6  Vital Signs: Temp: 98.6 F (37 C) (04/07 0423) Temp Source: Oral (04/07 0423) BP: 125/89 (04/07 0423) Pulse Rate: 83 (04/07 0423)  Labs: Recent Labs     0000 08/06/23 1512 08/06/23 2348 08/07/23 1131 08/08/23 0828 08/08/23 1605 08/09/23 0121 08/09/23 1115  HGB   < >  --  13.5  --  13.9  --  13.1  --   HCT  --   --  40.6  --  41.9  --  39.2  --   PLT  --   --  528*  --  502*  --  487*  --   APTT  --  41* 78*  --   --   --   --   --   HEPARINUNFRC  --   --  0.44   < > 0.17* 0.26* 0.51 0.51  CREATININE  --   --  1.22*  --  1.17*  --  1.17*  --    < > = values in this interval not displayed.    Estimated Creatinine Clearance: 43.7 mL/min (A) (by C-G formula based on SCr of 1.17 mg/dL (H)).   Medical History: Past Medical History:  Diagnosis Date   Hypertension     Medications:  On apixaban 5 mg BID at home, last dose 4/3 AM  Assessment: Patient is a 68 year old female recently diagnosed with a PE, on Eliquis. She presented to ED with SOB and reports being compliant to Eliquis therapy. Pharmacy was consulted to start patient on a heparin infusion. Will monitor via aPTT levels given recent apixaban use.   4/7 AM: Heparin level therapeutic x 2 at 0.51 with heparin running at 1750 units/hour. CBC WNL. No signs of bleeding or issues with the heparin infusion noted.     Goal of Therapy:  Heparin level 0.3-0.7 units/mL Monitor platelets by anticoagulation protocol: Yes   Plan:  Cont heparin 1750 units/hr AM HL and CBC   Jani Gravel, PharmD Clinical Pharmacist  08/09/2023 12:00 PM

## 2023-08-09 NOTE — Consult Note (Signed)
 NAME:  Betty Zamora, MRN:  [id number], DOB:  Sep 07, 1955, LOS: 4 ADMISSION DATE:  08/05/2023, CONSULTATION DATE:  08/09/23 REFERRING MD:  Dr. Sharlene Dory, CHIEF COMPLAINT:  pleural effusion   History of Present Illness:   13 yoF with PMH of tobacco abuse, HTN, HFpEF, GERD, CKD and recent PE/ RLE DVT with pulmonary infarction requiring hospitalization 3/20> 3/25 discharged home on Eliquis.  Pt states she has had unchanged exertional dyspnea with some orthopnea since her first admit.  Repeat CT chest w/ contrast showed decreased in embolic burden but development of large right pleural effusion with subtotal collapse of right lung. Was admitted to [facility] and started on diureses and transitioned to heparin gtt.  Underwent IR thoracentesis on 4/4 with 1.5L of hazy amber fluid removed with some improvement on expansion noted on CXR, noted elevated LDH and neutrophil predominant, with culture ngtd.  Ongoing dyspnea, with repeat CXR showing increased effusion.  Back to IR 4/7 for repeat thora, drained only and noted to have loculations.  No hypoxia, currently on room air, afebrile, normal WBC.  PCCM called for further evaluation and treatment.    Pt reports reports BLE x 3 months but improved this admit after lasix.  Ongoing easily exertional dyspnea.   Denies any recent fever, chills, cough, sputum production, chest pain, or unintentional weight loss.  Quit smoking on her last admit, 37 year hx, 1 pack per week.  Was working full time in factory standing for 12hrs until recently but does admit to less activity given progressive BLE edema.   Pertinent  Medical History  HTN, recent PE/ RLE DVT on Eliquis, HFpEF, GERD, CKD IIIb, vit D  Significant Hospital Events: Including procedures, antibiotic start and stop dates in addition to other pertinent events   Recent admit 3/20-3/25 for PE w/ pulmonary infarction, RLE DVT d/c on Eliquis Admit TRH 4/4 large right pleural effusion, IR R thora 1.5L 4/7 IR right  thora 100 ml, noted loculations   Interim History / Subjective:   Objective   Blood pressure 125/89, pulse 83, temperature 98.6 F (37 C), temperature source Oral, resp. rate 18, height 5\' 6"  (1.676 m), weight 70.6 kg, SpO2 96%.        Intake/Output Summary (Last 24 hours) at 08/09/2023 1348 Last data filed at 08/09/2023 0500 Gross per 24 hour  Intake 932.69 ml  Output --  Net 932.69 ml   Filed Weights   08/05/23 1508  Weight: 70.6 kg    Examination: General:  pleasant older female sitting upright in bed in NAD HEENT: MM pink/moist, no JVD Neuro: Aox4, MAE CV: rr, no murmur PULM:  non labored, no significant dyspnea, clear left, diminished right lung posteriorly with few crackles mid posterior GI: soft, bs+, NT Extremities: warm/dry, R>L +1 ankle edema  Skin: no rashes     Resolved Hospital Problem list    Assessment & Plan:   Right loculated pleural effusion - septations noted on bedside US as above, more posteriorly noted - exudate by LDH, suspect related to recent PE.  No hx of infectious symptoms and continues to accumulate despite diuresis with HFpEF, BNP 107.  Malignancy can not be excluded given risk factors.   P:  - currently on room air, afebrile, otherwise hemodynamically stable  - plans to place pigtail chest tube 4/8 in Endo at 1400 w/ PCCM.  Will need to hold heparin at noon, 2hrs prior to procedure  - will try and add GS, culture and cytology to IR's fluid  today - likely will need pleural lytics depending on pleural drainage and imaging, to be discussed with attending, given ongoing requirements for anticoagulation with recent DVT/ PE, high risk for bleeding w/pleural lytics - cont to follow cytology and pleural cultures - will need f/u CT chest w/o contrast after right pleural space drained   Remainder per primary team, PCCM will continue to see.   Best Practice (right click and "Reselect all SmartList Selections" daily)  Per primary team  Labs    CBC: Recent Labs  Lab 08/05/23 1703 08/06/23 0749 08/06/23 2348 08/08/23 0828 08/09/23 0121  WBC 7.4 8.3 6.5 8.8 9.2  NEUTROABS 5.9  --   --   --   --   HGB 18.1* 14.2 13.5 13.9 13.1  HCT 55.7* 44.5 40.6 41.9 39.2  MCV 98.9 99.8 96.2 97.2 95.4  PLT 519* 586* 528* 502* 487*    Basic Metabolic Panel: Recent Labs  Lab 08/05/23 1703 08/06/23 0749 08/06/23 2348 08/08/23 0828 08/09/23 0121  NA 139 137 134* 138 136  K 5.0 4.9 4.5 5.1 4.3  CL 110 109 109 110 110  CO2 20* 20* 18* 21* 19*  GLUCOSE 100* 84 109* 96 112*  BUN 30* 32* 33* 33* 34*  CREATININE 1.16* 1.10* 1.22* 1.17* 1.17*  CALCIUM 8.9 8.5* 8.1* 8.4* 8.1*  MG  --  2.2 2.1 2.3 2.1  PHOS  --  3.8  --   --   --    GFR: Estimated Creatinine Clearance: 43.7 mL/min (A) (by C-G formula based on SCr of 1.17 mg/dL (H)). Recent Labs  Lab 08/06/23 0749 08/06/23 2348 08/08/23 0828 08/09/23 0121  WBC 8.3 6.5 8.8 9.2    Liver Function Tests: Recent Labs  Lab 08/06/23 0749  AST 14*  ALT 13  ALKPHOS 84  BILITOT 0.6  PROT 5.3*  ALBUMIN <1.5*   No results for input(s): "LIPASE", "AMYLASE" in the last 168 hours. No results for input(s): "AMMONIA" in the last 168 hours.  ABG No results found for: "PHART", "PCO2ART", "PO2ART", "HCO3", "TCO2", "ACIDBASEDEF", "O2SAT"   Coagulation Profile: No results for input(s): "INR", "PROTIME" in the last 168 hours.  Cardiac Enzymes: No results for input(s): "CKTOTAL", "CKMB", "CKMBINDEX", "TROPONINI" in the last 168 hours.  HbA1C: No results found for: "HGBA1C"  CBG: No results for input(s): "GLUCAP" in the last 168 hours.  Review of Systems:   Review of Systems  Constitutional:  Positive for malaise/fatigue. Negative for chills, fever and weight loss.  HENT:  Negative for sore throat.   Respiratory:  Positive for shortness of breath. Negative for cough, hemoptysis, sputum production and wheezing.   Cardiovascular:  Positive for orthopnea and leg swelling. Negative for  chest pain.  Neurological:  Negative for dizziness and focal weakness.   Past Medical History:  She,  has a past medical history of Hypertension.   Surgical History:   Past Surgical History:  Procedure Laterality Date   ABDOMINAL HYSTERECTOMY     IR THORACENTESIS ASP PLEURAL SPACE W/IMG GUIDE  08/06/2023   IR THORACENTESIS ASP PLEURAL SPACE W/IMG GUIDE  08/09/2023     Social History:   reports that she has quit smoking. Her smoking use included cigarettes. She has never used smokeless tobacco. She reports current alcohol use. She reports that she does not use drugs.   Family History:  Her family history is not on file.   Allergies No Known Allergies   Home Medications  Prior to Admission medications   Medication Sig Start  Date End Date Taking? Authorizing Provider  acetaminophen (TYLENOL) 325 MG tablet Take 2 tablets (650 mg total) by mouth every 6 (six) hours as needed for mild pain (pain score 1-3) (or Fever >/= 101). 07/27/23  Yes Elgergawy, Leana Roe, MD  APIXABAN (ELIQUIS) VTE STARTER PACK (10MG  AND 5MG ) Take as directed on package: start with two-5mg  tablets twice daily for 5 days. On day 6, switch to one-5mg  tablet twice daily on 07/31/2023. 07/27/23  Yes Elgergawy, Leana Roe, MD  olmesartan (BENICAR) 40 MG tablet Take 40 mg by mouth daily. 07/14/23  Yes [provider]  pantoprazole (PROTONIX) 40 MG tablet Take 1 tablet (40 mg total) by mouth daily. Patient not taking: Reported on 08/05/2023 07/27/23   Elgergawy, Leana Roe, MD     Critical care time: n/a       Posey Boyer, MSN, AG-ACNP-BC [facility] 08/09/2023, 3:39 PM  See Amion for pager If no response to pager , please call [phone] until 7pm After 7:00 pm call Elink  [phone]

## 2023-08-10 ENCOUNTER — Inpatient Hospital Stay (HOSPITAL_COMMUNITY)

## 2023-08-10 ENCOUNTER — Encounter (HOSPITAL_COMMUNITY): Admission: EM | Disposition: E | Payer: Self-pay | Source: Home / Self Care | Attending: Internal Medicine

## 2023-08-10 ENCOUNTER — Encounter (HOSPITAL_COMMUNITY): Payer: Self-pay | Admitting: Internal Medicine

## 2023-08-10 DIAGNOSIS — J90 Pleural effusion, not elsewhere classified: Principal | ICD-10-CM

## 2023-08-10 LAB — CBC
HCT: 39.4 % (ref 36.0–46.0)
Hemoglobin: 12.9 g/dL (ref 12.0–15.0)
MCH: 31.2 pg (ref 26.0–34.0)
MCHC: 32.7 g/dL (ref 30.0–36.0)
MCV: 95.4 fL (ref 80.0–100.0)
Platelets: 505 10*3/uL — ABNORMAL HIGH (ref 150–400)
RBC: 4.13 MIL/uL (ref 3.87–5.11)
RDW: 13.6 % (ref 11.5–15.5)
WBC: 10.1 10*3/uL (ref 4.0–10.5)
nRBC: 0 % (ref 0.0–0.2)

## 2023-08-10 LAB — BASIC METABOLIC PANEL WITH GFR
Anion gap: 10 (ref 5–15)
BUN: 35 mg/dL — ABNORMAL HIGH (ref 8–23)
CO2: 18 mmol/L — ABNORMAL LOW (ref 22–32)
Calcium: 8.5 mg/dL — ABNORMAL LOW (ref 8.9–10.3)
Chloride: 109 mmol/L (ref 98–111)
Creatinine, Ser: 1.3 mg/dL — ABNORMAL HIGH (ref 0.44–1.00)
GFR, Estimated: 45 mL/min — ABNORMAL LOW (ref >60)
Glucose, Bld: 92 mg/dL (ref 70–99)
Potassium: 4.5 mmol/L (ref 3.5–5.1)
Sodium: 137 mmol/L (ref 135–145)

## 2023-08-10 LAB — BODY FLUID CULTURE W GRAM STAIN: Culture: NO GROWTH

## 2023-08-10 LAB — HEPARIN LEVEL (UNFRACTIONATED): Heparin Unfractionated: 0.44 [IU]/mL (ref 0.30–0.70)

## 2023-08-10 SURGERY — CHEST TUBE INSERTION
Anesthesia: LOCAL

## 2023-08-10 MED ORDER — KETOROLAC TROMETHAMINE 15 MG/ML IJ SOLN
15.0000 mg | Freq: Four times a day (QID) | INTRAMUSCULAR | Status: AC
  Administered 2023-08-10 – 2023-08-11 (×3): 15 mg via INTRAVENOUS
  Filled 2023-08-10 (×3): qty 1

## 2023-08-10 MED ORDER — SODIUM CHLORIDE 0.9% FLUSH
10.0000 mL | Freq: Three times a day (TID) | INTRAVENOUS | Status: DC
  Administered 2023-08-10 – 2023-08-15 (×10): 10 mL via INTRAPLEURAL

## 2023-08-10 MED ORDER — SODIUM CHLORIDE (PF) 0.9 % IJ SOLN
10.0000 mg | Freq: Once | INTRAMUSCULAR | Status: AC
  Administered 2023-08-10: 10 mg via INTRAPLEURAL
  Filled 2023-08-10: qty 10

## 2023-08-10 MED ORDER — STERILE WATER FOR INJECTION IJ SOLN
5.0000 mg | Freq: Once | RESPIRATORY_TRACT | Status: AC
  Administered 2023-08-10: 5 mg via INTRAPLEURAL
  Filled 2023-08-10: qty 5

## 2023-08-10 NOTE — Progress Notes (Signed)
 PHARMACY - ANTICOAGULATION CONSULT NOTE  Pharmacy Consult for Heparin Infusion Indication: pulmonary embolus  No Known Allergies  Patient Measurements: Height: 5\' 6"  (167.6 cm) Weight: 70.6 kg (155 lb 9.6 oz) IBW/kg (Calculated) : 59.3 HEPARIN DW (KG): 70.6  Vital Signs: Temp: 98.4 F (36.9 C) (04/08 0742) Temp Source: Oral (04/08 0358) BP: 143/83 (04/08 0742) Pulse Rate: 84 (04/08 0742)  Labs: Recent Labs    08/08/23 0828 08/08/23 1605 08/09/23 0121 08/09/23 1115 08/10/23 0604  HGB 13.9  --  13.1  --  12.9  HCT 41.9  --  39.2  --  39.4  PLT 502*  --  487*  --  505*  HEPARINUNFRC 0.17*   < > 0.51 0.51 0.44  CREATININE 1.17*  --  1.17*  --  1.30*   < > = values in this interval not displayed.    Estimated Creatinine Clearance: 39.3 mL/min (A) (by C-G formula based on SCr of 1.3 mg/dL (H)).   Medical History: Past Medical History:  Diagnosis Date   Hypertension     Medications:  On apixaban 5 mg BID at home, last dose 4/3 AM  Assessment: Patient is a 68 year old female recently diagnosed with a PE, on Eliquis. She presented to ED with SOB and reports being compliant to Eliquis therapy. Pharmacy was consulted to start patient on a heparin infusion. Will monitor via aPTT levels given recent apixaban use.   4/8 AM: Heparin level continues to be therapeutic at 0.44 with heparin running at 1750 units/hour. No signs of bleeding or issues with the heparin infusion noted.   S/p thoracentesis 4/7.    Goal of Therapy:  Heparin level 0.3-0.7 units/mL Monitor platelets by anticoagulation protocol: Yes   Plan:  Cont heparin 1750 units/hr AM HL and CBC   Jani Gravel, PharmD Clinical Pharmacist  08/10/2023 8:09 AM

## 2023-08-10 NOTE — Plan of Care (Signed)

## 2023-08-10 NOTE — Op Note (Signed)
 Insertion of Chest Tube Procedure Note  Betty Zamora  [id number]  21-May-1955  Date:08/10/23  Time:3:12 PM    Provider Performing: Lorin Glass   Procedure: Pleural Catheter Insertion w/ Imaging Guidance (40981)  Indication(s) Effusion  Consent Risks of the procedure as well as the alternatives and risks of each were explained to the patient and/or caregiver.  Consent for the procedure was obtained and is signed in the bedside chart  Anesthesia Topical only with 1% lidocaine    Time Out Verified patient identification, verified procedure, site/side was marked, verified correct patient position, special equipment/implants available, medications/allergies/relevant history reviewed, required imaging and test results available.   Sterile Technique Maximal sterile technique including full sterile barrier drape, hand hygiene, sterile gown, sterile gloves, mask, hair covering, sterile ultrasound probe cover (if used).   Procedure Description Ultrasound used to identify appropriate pleural anatomy for placement and overlying skin marked. Area of placement cleaned and draped in sterile fashion.  A 14 French pigtail pleural catheter was placed into the right pleural space using Seldinger technique. Appropriate return of fluid was obtained.  The tube was connected to atrium and placed on -20 cm H2O wall suction.   Complications/Tolerance None; patient tolerated the procedure well. Chest X-ray is ordered to verify placement.   EBL Minimal  Specimen(s) fluid

## 2023-08-10 NOTE — Progress Notes (Signed)
 Progress Note   Patient: Betty Zamora ZOX:[id number] DOB: 1955-07-24 DOA: 08/05/2023     5 DOS: the patient was seen and examined on 08/10/2023   Brief hospital course: 68 y.o. female with medical history significant of hypertension, vitamin D deficiency, PE on Eliquis, GERD and chronic kidney disease; who presented to the emergency department due to increasing shortness of breath.  Patient was recently discharged on 3/25 after noted to have acute right lower extremity DVT as well as PE and was discharged on Eliquis.  She is now noted to have interval development of large right pleural effusion with subtotal collapse of the right lung and is in need of thoracentesis.  She will therefore need transfer to [facility].   Assessment and Plan:  Acute loculated right pleural effusion - Chest x-ray personally reviewed showing moderate to severe send effusion with atelectasis/collapse right lung.  [facility] secondary to needing thoracentesis (unavailable at that facility).  S/P thoracentesis 4/4 with 1.5L hazy amber fluid removed.  Postprocedure chest x-ray personally reviewed noting improvement in lung expansion but with residual effusion.  Studies noting elevated LDH, neutrophil count.  Culture NGTD.  Likely related to patient's new PE/pulmonary infarcts rather than infection or other insidious malignant etiology.  Patient still complaining of some fullness and shortness of breath.  Fortunately patient's pain has been nearly eliminated after initiation of IV Toradol.  Repeat chest x-ray personally reviewed shows no improvement in residual effusion, possible even enlarging since previous chest x-ray.  Repeat attempt at thoracentesis pulled only of fluid, but also noting loculations on [place]. Given loculations, likely pt may need tpase,etc.. consult pulm placed and following closely.    Pulmonary embolism (POA) with pulmonary infarct (new) - Diagnosed at previous hospitalization,  discharged 3/25 on Eliquis.  Repeat CT angio noting no new PE, continued embolic burden, but with new right infarct.  Currently on heparin drip.  S/p thoracentesis as above.  Will monitor respiratory closely.  Currently not hypoxic.    Chronic HFpEF - Does not appear to be in acute exacerbation although may be contributing to patient's pleural effusion.  No lower extremity edema, BNP similar to previous.  Discontinue IV Lasix 40 mg today as she appears intravascularly dry.  Monitor urine output recheck BMP in AM.  CKD 3B - Creatinine appears at baseline.  Monitor urine output recheck BMP in AM.  GERD - Protonix on board.      Subjective: Patient resting comfortably this morning.  States that her chest pain is markedly nearly resolved this morning.  Still complaining of fullness and shortness of breath however.  Denies fever, chills, chest pain, nausea, vomiting, abdominal pain.  Denies any bleeding or coughing up blood.  Discussed the need for pulmonology to place catheter and attempt to break up the loculations.  Patient was understanding.  Physical Exam: Vitals:   08/10/23 0355 08/10/23 0358 08/10/23 0742 08/10/23 1202  BP: (!) 152/91 (!) 152/91 (!) 143/83 (!) 149/82  Pulse: 80 80 84 78  Resp: 16 16 19 16   Temp: 98.5 F (36.9 C) 98.5 F (36.9 C) 98.4 F (36.9 C) 97.8 F (36.6 C)  TempSrc:  Oral    SpO2: 96% 98% 95% 94%  Weight:      Height:       GENERAL:  Alert, pleasant, mild acute distress  HEENT:  EOMI CARDIOVASCULAR:  RRR, no murmurs appreciated RESPIRATORY: Poor air movement, dull on right base GASTROINTESTINAL:  Soft, nontender, nondistended EXTREMITIES:  No LE  edema bilaterally NEURO:  No new focal deficits appreciated SKIN:  No rashes noted PSYCH:  Appropriate mood and affect   Data Reviewed:  Chest x-ray personally reviewed noting moderate right pleural effusion with compression atelectasis of the lung  Family Communication: None at  bedside  Disposition: Status is: Inpatient Remains inpatient appropriate because: Thoracentesis for pleural effusion and PE  Planned Discharge Destination: Home    Time spent: 33 minutes  Author: Deanna Artis, DO 08/10/2023 12:26 PM  For on call review [email or web address].

## 2023-08-10 NOTE — Interval H&P Note (Signed)
 No changes Consents to pigtail  Myrla Halsted MD PCCM

## 2023-08-10 NOTE — Procedures (Signed)
 Pleural Fibrinolytic Administration Procedure Note  Betty Zamora  [id number]  04/05/1956  Date:08/10/23  Time:3:48 PM   Provider Performing: Tim Lair    Procedure: Pleural Fibrinolysis Initial day [phone])   Indication(s): Fibrinolysis of complicated pleural effusion  Consent: Risks of the procedure as well as the alternatives and risks of each were explained to the patient and/or caregiver.  Consent for the procedure was obtained.   Anesthesia: None   Time Out: Verified patient identification, verified procedure, site/side was marked, verified correct patient position, special equipment/implants available, medications/allergies/relevant history reviewed, required imaging and test results available.   Sterile Technique: Hand hygiene, gloves   Procedure Description: Existing pleural catheter was cleaned and accessed in sterile manner.  10mg  of tPA in 30cc of saline and 5mg  of dornase in 30cc of sterile water were injected into pleural space using existing pleural catheter.  Catheter will be clamped for 1 hour and then placed back to suction.   Complications/Tolerance: None; patient tolerated the procedure well.   EBL: None   Specimen(s): None  Tim Lair, [place] [facility] 08/10/23 3:48 PM  Please see [email or web address] for pager details.  From 7A-7P if no response, please call [phone] After hours, please call ELink [phone]

## 2023-08-11 ENCOUNTER — Inpatient Hospital Stay (HOSPITAL_COMMUNITY)

## 2023-08-11 ENCOUNTER — Ambulatory Visit (HOSPITAL_COMMUNITY)

## 2023-08-11 ENCOUNTER — Encounter (HOSPITAL_COMMUNITY): Payer: Self-pay | Admitting: Internal Medicine

## 2023-08-11 DIAGNOSIS — J90 Pleural effusion, not elsewhere classified: Secondary | ICD-10-CM | POA: Diagnosis not present

## 2023-08-11 LAB — CBC
HCT: 39.1 % (ref 36.0–46.0)
HCT: 36.9 % (ref 36.0–46.0)
Hemoglobin: 12.9 g/dL (ref 12.0–15.0)
Hemoglobin: 12.1 g/dL (ref 12.0–15.0)
MCH: 31.6 pg (ref 26.0–34.0)
MCH: 31.5 pg (ref 26.0–34.0)
MCHC: 33 g/dL (ref 30.0–36.0)
MCHC: 32.8 g/dL (ref 30.0–36.0)
MCV: 95.8 fL (ref 80.0–100.0)
MCV: 96.1 fL (ref 80.0–100.0)
Platelets: 515 10*3/uL — ABNORMAL HIGH (ref 150–400)
Platelets: 511 10*3/uL — ABNORMAL HIGH (ref 150–400)
RBC: 4.08 MIL/uL (ref 3.87–5.11)
RBC: 3.84 MIL/uL — ABNORMAL LOW (ref 3.87–5.11)
RDW: 13.6 % (ref 11.5–15.5)
RDW: 13.7 % (ref 11.5–15.5)
WBC: 10.1 10*3/uL (ref 4.0–10.5)
WBC: 10.3 10*3/uL (ref 4.0–10.5)
nRBC: 0 % (ref 0.0–0.2)
nRBC: 0 % (ref 0.0–0.2)

## 2023-08-11 LAB — BASIC METABOLIC PANEL WITH GFR
Anion gap: 10 (ref 5–15)
BUN: 37 mg/dL — ABNORMAL HIGH (ref 8–23)
CO2: 19 mmol/L — ABNORMAL LOW (ref 22–32)
Calcium: 8.2 mg/dL — ABNORMAL LOW (ref 8.9–10.3)
Chloride: 107 mmol/L (ref 98–111)
Creatinine, Ser: 1.63 mg/dL — ABNORMAL HIGH (ref 0.44–1.00)
GFR, Estimated: 34 mL/min — ABNORMAL LOW (ref >60)
Glucose, Bld: 91 mg/dL (ref 70–99)
Potassium: 4.8 mmol/L (ref 3.5–5.1)
Sodium: 136 mmol/L (ref 135–145)

## 2023-08-11 LAB — APTT: aPTT: 54 s — ABNORMAL HIGH (ref 24–36)

## 2023-08-11 LAB — PROTIME-INR
INR: 1.1 (ref 0.8–1.2)
Prothrombin Time: 14.7 s (ref 11.4–15.2)

## 2023-08-11 LAB — HEPARIN LEVEL (UNFRACTIONATED): Heparin Unfractionated: 0.27 [IU]/mL — ABNORMAL LOW (ref 0.30–0.70)

## 2023-08-11 MED ORDER — SODIUM CHLORIDE (PF) 0.9 % IJ SOLN
10.0000 mg | Freq: Once | INTRAMUSCULAR | Status: AC
  Administered 2023-08-11: 10 mg via INTRAPLEURAL
  Filled 2023-08-11: qty 10

## 2023-08-11 MED ORDER — OXYCODONE HCL 5 MG PO TABS: 5.0000 mg | ORAL_TABLET | ORAL | Status: DC | PRN

## 2023-08-11 MED ORDER — STERILE WATER FOR INJECTION IJ SOLN
5.0000 mg | Freq: Once | RESPIRATORY_TRACT | Status: AC
  Administered 2023-08-11: 5 mg via INTRAPLEURAL
  Filled 2023-08-11: qty 5

## 2023-08-11 MED ORDER — OXYCODONE-ACETAMINOPHEN 5-325 MG PO TABS
1.0000 | ORAL_TABLET | ORAL | Status: DC | PRN
  Administered 2023-08-11 – 2023-08-14 (×2): 1 via ORAL
  Filled 2023-08-11 (×2): qty 1

## 2023-08-11 NOTE — Progress Notes (Signed)
 Patients chest tubes was almost full, nurse contacted Md,  Md advised nursing staff to change the chest tube, but keep it so she could look at, Md gave orders for stat cbc and to hold heparin, Nursing staff replace canister and marked the canister where the drainage was at the time that she left, On-coming nurse was made aware.

## 2023-08-11 NOTE — Progress Notes (Signed)
 Interim progress note:  Increased CT drainage over the last 4hrs, approximately 1600cc bloody/serous drainage. Hemodynamically stable, CBC and coags pending. Heparin held for tonight.   Darcella Gasman Florence Yeung, PA-C

## 2023-08-11 NOTE — Progress Notes (Signed)
  Progress Note   Patient: Betty Zamora WGN:[id number] DOB: Dec 26, 1955 DOA: 08/05/2023     6 DOS: the patient was seen and examined on 08/11/2023   Brief hospital course: 68 y.o. female with medical history significant of hypertension, vitamin D deficiency, PE on Eliquis, GERD and chronic kidney disease; who presented to the emergency department due to increasing shortness of breath.  Patient was recently discharged on 3/25 after noted to have acute right lower extremity DVT as well as PE and was discharged on Eliquis.  She is now noted to have interval development of large right pleural effusion with subtotal collapse of the right lung and is in need of thoracentesis.  She will therefore need transfer to [facility].   Assessment and Plan: Acute loculated R sided pleural effusion  - Pulmonary assistance with chest tube/lytics is appreciated  PE w/ pulmonary infarct  - IV heparin drip   Chronic HFpEF - Monitor; lasix discontinued by previous provider   CKD3b - Monitor  Subjective: Pt seen and examined at the bedside. Chest tube is draining well. Appreciate pulmonary following along. Continue with heparin drip.  Physical Exam: Vitals:   08/10/23 1924 08/10/23 2354 08/11/23 0419 08/11/23 0735  BP: 130/74 137/72 134/81 139/82  Pulse: 85 77 76 76  Resp: 18  18 18   Temp: 98.4 F (36.9 C) 98.4 F (36.9 C) 98.1 F (36.7 C) 98.4 F (36.9 C)  TempSrc: Oral Oral Oral Oral  SpO2: 97% 96% 97% 98%  Weight:      Height:       Physical Exam Constitutional:      Appearance: Normal appearance.  HENT:     Head: Normocephalic.     Mouth/Throat:     Mouth: Mucous membranes are moist.  Cardiovascular:     Rate and Rhythm: Normal rate and regular rhythm.  Pulmonary:     Comments: Decreased breath sounds on the R  Abdominal:     Palpations: Abdomen is soft.  Musculoskeletal:        General: Normal range of motion.     Cervical back: Neck supple.  Skin:    General: Skin is warm.   Neurological:     Mental Status: She is alert. Mental status is at baseline.  Psychiatric:        Mood and Affect: Mood normal.       Disposition: Status is: Inpatient Remains inpatient appropriate because: chest tube and lytics per pulmonary service   Planned Discharge Destination: Home    Time spent: 35 minutes  Author: Baron Hamper , MD 08/11/2023 12:31 PM  For on call review [email or web address].

## 2023-08-11 NOTE — Progress Notes (Signed)
 NAME:  Betty Zamora, MRN:  [id number], DOB:  1956-04-26, LOS: 6 ADMISSION DATE:  08/05/2023, CONSULTATION DATE:  08/09/23 REFERRING MD:  Dr. Sharlene Dory, CHIEF COMPLAINT:  pleural effusion   History of Present Illness:   10 yoF with PMH of tobacco abuse, HTN, HFpEF, GERD, CKD and recent PE/ RLE DVT with pulmonary infarction requiring hospitalization 3/20> 3/25 discharged home on Eliquis.  Pt states she has had unchanged exertional dyspnea with some orthopnea since her first admit.  Repeat CT chest w/ contrast showed decreased in embolic burden but development of large right pleural effusion with subtotal collapse of right lung. Was admitted to [facility] and started on diureses and transitioned to heparin gtt.  Underwent IR thoracentesis on 4/4 with 1.5L of hazy amber fluid removed with some improvement on expansion noted on CXR, noted elevated LDH and neutrophil predominant, with culture ngtd.  Ongoing dyspnea, with repeat CXR showing increased effusion.  Back to IR 4/7 for repeat thora, drained only and noted to have loculations.  No hypoxia, currently on room air, afebrile, normal WBC.  PCCM called for further evaluation and treatment.    Pt reports reports BLE x 3 months but improved this admit after lasix.  Ongoing easily exertional dyspnea.   Denies any recent fever, chills, cough, sputum production, chest pain, or unintentional weight loss.  Quit smoking on her last admit, 37 year hx, 1 pack per week.  Was working full time in factory standing for 12hrs until recently but does admit to less activity given progressive BLE edema.   Pertinent  Medical History  HTN, recent PE/ RLE DVT on Eliquis, HFpEF, GERD, CKD IIIb, vit D  Significant Hospital Events: Including procedures, antibiotic start and stop dates in addition to other pertinent events   Recent admit 3/20-3/25 for PE w/ pulmonary infarction, RLE DVT d/c on Eliquis Admit TRH 4/4 large right pleural effusion, IR R thora 1.5L 4/7 IR right  thora 100 ml, noted loculations   Interim History / Subjective:   About 400cc CT output after fibrinolytics yesterday   Objective   Blood pressure (!) 144/82, pulse 86, temperature 99.1 F (37.3 C), temperature source Oral, resp. rate (!) 24, height 5\' 6"  (1.676 m), weight 70.6 kg, SpO2 98%.        Intake/Output Summary (Last 24 hours) at 08/11/2023 1327 Last data filed at 08/11/2023 0540 Gross per 24 hour  Intake --  Output 400 ml  Net -400 ml   Filed Weights   08/05/23 1508 08/10/23 1340  Weight: 70.6 kg 70.6 kg    Examination: General:  well nourished elderly F sitting up in bed in NAD HEENT: MM pink/moist, no JVD Neuro: Aox4, MAE CV: rr, no murmur PULM:  no dyspnea or accessory muscle use on RA, speaking in full sentences without difficulty, R CT in place  GI: soft, bs+, NT Extremities: warm/dry, trace LE edema Skin: no rashes     Resolved Hospital Problem list    Assessment & Plan:   Right loculated pleural effusion - septations noted on bedside US as above, more posteriorly noted - exudate by LDH, suspect related to recent PE.  No hx of infectious symptoms and continues to accumulate despite diuresis with HFpEF, BNP 107.   -likely inflammatory  P -CT placed on 4/8, day #2 of fibrinolytics  - monitor for bleeding  - cont to follow cytology, fluid culture with no growth - will need f/u CT chest w/o contrast after right pleural space drained  Remainder per primary team, PCCM will continue to see.   Best Practice (right click and "Reselect all SmartList Selections" daily)  Per primary team  Labs   CBC: Recent Labs  Lab 08/05/23 1703 08/06/23 0749 08/06/23 2348 08/08/23 0828 08/09/23 0121 08/10/23 0604 08/11/23 0625  WBC 7.4   < > 6.5 8.8 9.2 10.1 10.1  NEUTROABS 5.9  --   --   --   --   --   --   HGB 18.1*   < > 13.5 13.9 13.1 12.9 12.9  HCT 55.7*   < > 40.6 41.9 39.2 39.4 39.1  MCV 98.9   < > 96.2 97.2 95.4 95.4 95.8  PLT 519*   < > 528* 502*  487* 505* 515*   < > = values in this interval not displayed.    Basic Metabolic Panel: Recent Labs  Lab 08/06/23 0749 08/06/23 2348 08/08/23 0828 08/09/23 0121 08/10/23 0604 08/11/23 0625  NA 137 134* 138 136 137 136  K 4.9 4.5 5.1 4.3 4.5 4.8  CL 109 109 110 110 109 107  CO2 20* 18* 21* 19* 18* 19*  GLUCOSE 84 109* 96 112* 92 91  BUN 32* 33* 33* 34* 35* 37*  CREATININE 1.10* 1.22* 1.17* 1.17* 1.30* 1.63*  CALCIUM 8.5* 8.1* 8.4* 8.1* 8.5* 8.2*  MG 2.2 2.1 2.3 2.1  --   --   PHOS 3.8  --   --   --   --   --    GFR: Estimated Creatinine Clearance: 31.4 mL/min (A) (by C-G formula based on SCr of 1.63 mg/dL (H)). Recent Labs  Lab 08/08/23 0828 08/09/23 0121 08/10/23 0604 08/11/23 0625  WBC 8.8 9.2 10.1 10.1    Liver Function Tests: Recent Labs  Lab 08/06/23 0749  AST 14*  ALT 13  ALKPHOS 84  BILITOT 0.6  PROT 5.3*  ALBUMIN <1.5*   No results for input(s): "LIPASE", "AMYLASE" in the last 168 hours. No results for input(s): "AMMONIA" in the last 168 hours.  ABG No results found for: "PHART", "PCO2ART", "PO2ART", "HCO3", "TCO2", "ACIDBASEDEF", "O2SAT"   Coagulation Profile: No results for input(s): "INR", "PROTIME" in the last 168 hours.  Cardiac Enzymes: No results for input(s): "CKTOTAL", "CKMB", "CKMBINDEX", "TROPONINI" in the last 168 hours.  HbA1C: No results found for: "HGBA1C"  CBG: No results for input(s): "GLUCAP" in the last 168 hours.  Review of Systems:    Past Medical History:  She,  has a past medical history of Hypertension.   Surgical History:   Past Surgical History:  Procedure Laterality Date   ABDOMINAL HYSTERECTOMY     IR THORACENTESIS ASP PLEURAL SPACE W/IMG GUIDE  08/06/2023   IR THORACENTESIS ASP PLEURAL SPACE W/IMG GUIDE  08/09/2023     Social History:   reports that she has quit smoking. Her smoking use included cigarettes. She has never used smokeless tobacco. She reports current alcohol use. She reports that she does not  use drugs.   Family History:  Her family history is not on file.   Allergies No Known Allergies   Home Medications  Prior to Admission medications   Medication Sig Start Date End Date Taking? Authorizing Provider  acetaminophen (TYLENOL) 325 MG tablet Take 2 tablets (650 mg total) by mouth every 6 (six) hours as needed for mild pain (pain score 1-3) (or Fever >/= 101). 07/27/23  Yes Elgergawy, Leana Roe, MD  APIXABAN (ELIQUIS) VTE STARTER PACK (10MG  AND 5MG ) Take as directed on package: start  with two-5mg  tablets twice daily for 5 days. On day 6, switch to one-5mg  tablet twice daily on 07/31/2023. 07/27/23  Yes Elgergawy, Leana Roe, MD  olmesartan (BENICAR) 40 MG tablet Take 40 mg by mouth daily. 07/14/23  Yes [provider]  pantoprazole (PROTONIX) 40 MG tablet Take 1 tablet (40 mg total) by mouth daily. Patient not taking: Reported on 08/05/2023 07/27/23   Elgergawy, Leana Roe, MD     Critical care time: n/a     Darcella Gasman Chad Tiznado, PA-C [facility] See Amion for pager If no response to pager , please call [phone] until 7pm After 7:00 pm call Elink  [phone]

## 2023-08-11 NOTE — Progress Notes (Signed)
 PHARMACY - ANTICOAGULATION CONSULT NOTE  Pharmacy Consult for Heparin Infusion Indication: pulmonary embolus  No Known Allergies  Patient Measurements: Height: 5\' 6"  (167.6 cm) Weight: 70.6 kg (155 lb 10.3 oz) IBW/kg (Calculated) : 59.3 HEPARIN DW (KG): 70.6  Vital Signs: Temp: 98.4 F (36.9 C) (04/09 0735) Temp Source: Oral (04/09 0735) BP: 139/82 (04/09 0735) Pulse Rate: 76 (04/09 0735)  Labs: Recent Labs    08/09/23 0121 08/09/23 1115 08/10/23 0604 08/11/23 0625  HGB 13.1  --  12.9 12.9  HCT 39.2  --  39.4 39.1  PLT 487*  --  505* 515*  HEPARINUNFRC 0.51 0.51 0.44 0.27*  CREATININE 1.17*  --  1.30* 1.63*    Estimated Creatinine Clearance: 31.4 mL/min (A) (by C-G formula based on SCr of 1.63 mg/dL (H)).   Medical History: Past Medical History:  Diagnosis Date   Hypertension     Medications:  On apixaban 5 mg BID at home, last dose 4/3 AM  Assessment: Patient is a 68 year old female recently diagnosed with a PE, on Eliquis. She presented to ED with SOB and reports being compliant to Eliquis therapy. Pharmacy was consulted to start patient on a heparin infusion. Will monitor via aPTT levels given recent apixaban use.   4/8 AM: Heparin level now slightly subtherapeutic at 0.27 with heparin running at 1750 units/hour following several days of therapeutic levels. No signs of bleeding or issues with the heparin infusion noted.  Will increase rate slightly and recheck in AM.     Goal of Therapy:  Heparin level 0.3-0.7 units/mL Monitor platelets by anticoagulation protocol: Yes   Plan:  inc heparin to 1800 units/hr AM HL and CBC   Jani Gravel, PharmD Clinical Pharmacist  08/11/2023 8:38 AM

## 2023-08-12 ENCOUNTER — Inpatient Hospital Stay (HOSPITAL_COMMUNITY)

## 2023-08-12 DIAGNOSIS — J90 Pleural effusion, not elsewhere classified: Secondary | ICD-10-CM | POA: Diagnosis not present

## 2023-08-12 LAB — CBC
HCT: 33.4 % — ABNORMAL LOW (ref 36.0–46.0)
Hemoglobin: 11.1 g/dL — ABNORMAL LOW (ref 12.0–15.0)
MCH: 32.2 pg (ref 26.0–34.0)
MCHC: 33.2 g/dL (ref 30.0–36.0)
MCV: 96.8 fL (ref 80.0–100.0)
Platelets: 481 10*3/uL — ABNORMAL HIGH (ref 150–400)
RBC: 3.45 MIL/uL — ABNORMAL LOW (ref 3.87–5.11)
RDW: 13.7 % (ref 11.5–15.5)
WBC: 9.5 10*3/uL (ref 4.0–10.5)
nRBC: 0 % (ref 0.0–0.2)

## 2023-08-12 LAB — COMPREHENSIVE METABOLIC PANEL WITH GFR
ALT: 14 U/L (ref 0–44)
AST: 11 U/L — ABNORMAL LOW (ref 15–41)
Albumin: <1.5 g/dL — ABNORMAL LOW (ref 3.5–5.0)
Alkaline Phosphatase: 85 U/L (ref 38–126)
Anion gap: 10 (ref 5–15)
BUN: 42 mg/dL — ABNORMAL HIGH (ref 8–23)
CO2: 18 mmol/L — ABNORMAL LOW (ref 22–32)
Calcium: 8.3 mg/dL — ABNORMAL LOW (ref 8.9–10.3)
Chloride: 108 mmol/L (ref 98–111)
Creatinine, Ser: 2.02 mg/dL — ABNORMAL HIGH (ref 0.44–1.00)
GFR, Estimated: 27 mL/min — ABNORMAL LOW (ref >60)
Glucose, Bld: 95 mg/dL (ref 70–99)
Potassium: 4.9 mmol/L (ref 3.5–5.1)
Sodium: 136 mmol/L (ref 135–145)
Total Bilirubin: 0.2 mg/dL (ref 0.0–1.2)
Total Protein: 4.4 g/dL — ABNORMAL LOW (ref 6.5–8.1)

## 2023-08-12 LAB — CYTOLOGY - NON PAP

## 2023-08-12 LAB — MAGNESIUM: Magnesium: 2.2 mg/dL (ref 1.7–2.4)

## 2023-08-12 LAB — HEPARIN LEVEL (UNFRACTIONATED)
Heparin Unfractionated: <0.1 [IU]/mL — ABNORMAL LOW (ref 0.30–0.70)
Heparin Unfractionated: 0.7 [IU]/mL (ref 0.30–0.70)

## 2023-08-12 LAB — C-REACTIVE PROTEIN: CRP: 15.4 mg/dL — ABNORMAL HIGH (ref <1.0)

## 2023-08-12 MED ORDER — HEPARIN (PORCINE) 25000 UT/250ML-% IV SOLN
1650.0000 [IU]/h | INTRAVENOUS | Status: DC
  Administered 2023-08-12: 1800 [IU]/h via INTRAVENOUS
  Administered 2023-08-13: 1750 [IU]/h via INTRAVENOUS
  Administered 2023-08-13 – 2023-08-14 (×2): 1650 [IU]/h via INTRAVENOUS
  Filled 2023-08-12 (×4): qty 250

## 2023-08-12 MED ORDER — HYDROMORPHONE HCL 1 MG/ML IJ SOLN
0.5000 mg | INTRAMUSCULAR | Status: DC | PRN
  Administered 2023-08-12 – 2023-08-14 (×5): 0.5 mg via INTRAVENOUS
  Filled 2023-08-12 (×5): qty 0.5

## 2023-08-12 NOTE — Progress Notes (Signed)
 PHARMACY - ANTICOAGULATION CONSULT NOTE  Pharmacy Consult for Heparin Infusion Indication: pulmonary embolus  No Known Allergies  Patient Measurements: Height: 5\' 6"  (167.6 cm) Weight: 70.6 kg (155 lb 10.3 oz) IBW/kg (Calculated) : 59.3 HEPARIN DW (KG): 70.6  Vital Signs: Temp: 98.4 F (36.9 C) (04/10 0717) Temp Source: Oral (04/10 0717) BP: 123/81 (04/10 0717) Pulse Rate: 80 (04/10 0717)  Labs: Recent Labs    08/10/23 0604 08/11/23 0625 08/11/23 1958 08/12/23 0629  HGB 12.9 12.9 12.1 11.1*  HCT 39.4 39.1 36.9 33.4*  PLT 505* 515* 511* 481*  APTT  --   --  54*  --   LABPROT  --   --  14.7  --   INR  --   --  1.1  --   HEPARINUNFRC 0.44 0.27*  --  <0.10*  CREATININE 1.30* 1.63*  --  2.02*    Estimated Creatinine Clearance: 25.3 mL/min (A) (by C-G formula based on SCr of 2.02 mg/dL (H)).   Medical History: Past Medical History:  Diagnosis Date   Hypertension     Medications:  On apixaban 5 mg BID at home, last dose 4/3 AM  Assessment: Patient is a 68 year old female recently diagnosed with a PE, on Eliquis. She presented to ED with SOB and reports being compliant to Eliquis therapy. Pharmacy was consulted to start patient on a heparin infusion. Will monitor via aPTT levels given recent apixaban use.   4/10 AM: Heparin held overnight 4/9 d/t bloody drainage from chest tube following second round of lytic administration- likely not a candidate for further lytics going forward per CCM. CCM MD OK with resuming heparin 4/10 AM. Hgb with slight dip 12.1>>11.1 in context of above. Will avoid bolus for now.     Goal of Therapy:  Heparin level 0.3-0.7 units/mL Monitor platelets by anticoagulation protocol: Yes   Plan:  Restart heparin at 1800 units/hr Anti-Xa level in 8 hours  AM HL and CBC   Jani Gravel, PharmD Clinical Pharmacist  08/12/2023 8:09 AM

## 2023-08-12 NOTE — Progress Notes (Signed)
   NAME:  Betty Zamora, MRN:  [id number], DOB:  22-May-1955, LOS: 7 ADMISSION DATE:  08/05/2023, CONSULTATION DATE:  08/09/23 REFERRING MD:  Dr. Sharlene Dory, CHIEF COMPLAINT:  pleural effusion   History of Present Illness:   44 yoF with PMH of tobacco abuse, HTN, HFpEF, GERD, CKD and recent PE/ RLE DVT with pulmonary infarction requiring hospitalization 3/20> 3/25 discharged home on Eliquis.  Pt states she has had unchanged exertional dyspnea with some orthopnea since her first admit.  Repeat CT chest w/ contrast showed decreased in embolic burden but development of large right pleural effusion with subtotal collapse of right lung. Was admitted to [facility] and started on diureses and transitioned to heparin gtt.  Underwent IR thoracentesis on 4/4 with 1.5L of hazy amber fluid removed with some improvement on expansion noted on CXR, noted elevated LDH and neutrophil predominant, with culture ngtd.  Ongoing dyspnea, with repeat CXR showing increased effusion.  Back to IR 4/7 for repeat thora, drained only and noted to have loculations.  No hypoxia, currently on room air, afebrile, normal WBC.  PCCM called for further evaluation and treatment.    Pt reports reports BLE x 3 months but improved this admit after lasix.  Ongoing easily exertional dyspnea.   Denies any recent fever, chills, cough, sputum production, chest pain, or unintentional weight loss.  Quit smoking on her last admit, 37 year hx, 1 pack per week.  Was working full time in factory standing for 12hrs until recently but does admit to less activity given progressive BLE edema.   Pertinent  Medical History  HTN, recent PE/ RLE DVT on Eliquis, HFpEF, GERD, CKD IIIb, vit D  Significant Hospital Events: Including procedures, antibiotic start and stop dates in addition to other pertinent events   Recent admit 3/20-3/25 for PE w/ pulmonary infarction, RLE DVT d/c on Eliquis Admit TRH 4/4 large right pleural effusion, IR R thora 1.5L 4/7 IR right  thora 100 ml, noted loculations   Interim History / Subjective:  Great output overnight. Feels okay, some soreness at chest tube site   Objective   Blood pressure 123/81, pulse 80, temperature 98.4 F (36.9 C), temperature source Oral, resp. rate 18, height 5\' 6"  (1.676 m), weight 70.6 kg, SpO2 97%.        Intake/Output Summary (Last 24 hours) at 08/12/2023 0802 Last data filed at 08/12/2023 0600 Gross per 24 hour  Intake 10 ml  Output 2180 ml  Net -2170 ml   Filed Weights   08/05/23 1508 08/10/23 1340  Weight: 70.6 kg 70.6 kg    Examination: No distress Atrium with dark bloody fluid Lungs more clear Pigtail site looks okay  Resolved Hospital Problem list    Assessment & Plan:   Right loculated pleural effusion- in context of PE, pulmonary infarct; inflammatory - Lytics x 2, now pretty bloody, H/H stable  - Okay to resume heparin - CT chest then may take out tube today depending on output and appearance on CT - If ongoing fluid collection will leave until output < 200/day - Probably not candidate for further lytics given how bloody fluid has become and ongoing need for [facility]  Myrla Halsted MD PCCM

## 2023-08-12 NOTE — Progress Notes (Signed)
 PHARMACY - ANTICOAGULATION CONSULT NOTE  Pharmacy Consult for Heparin Indication: pulmonary embolus  No Known Allergies  Patient Measurements: Height: 5\' 6"  (167.6 cm) Weight: 70.6 kg (155 lb 10.3 oz) IBW/kg (Calculated) : 59.3 HEPARIN DW (KG): 70.6  Vital Signs: Temp: 98.5 F (36.9 C) (04/10 1121) Temp Source: Oral (04/10 1121) BP: 123/62 (04/10 1121) Pulse Rate: 82 (04/10 1121)  Labs: Recent Labs    08/10/23 0604 08/11/23 0625 08/11/23 1958 08/12/23 0629 08/12/23 1700  HGB 12.9 12.9 12.1 11.1*  --   HCT 39.4 39.1 36.9 33.4*  --   PLT 505* 515* 511* 481*  --   APTT  --   --  54*  --   --   LABPROT  --   --  14.7  --   --   INR  --   --  1.1  --   --   HEPARINUNFRC 0.44 0.27*  --  <0.10* 0.70  CREATININE 1.30* 1.63*  --  2.02*  --     Estimated Creatinine Clearance: 25.3 mL/min (A) (by C-G formula based on SCr of 2.02 mg/dL (H)).  Assessment: Patient is a 68 year old female recently diagnosed with a PE, on Eliquis. She presented to ED with SOB and reports being compliant to Eliquis therapy. Pharmacy was consulted to start patient on a heparin infusion. Will monitor via aPTT levels given recent apixaban use.    4/10 AM: Heparin held overnight 4/9 d/t bloody drainage from chest tube following second round of lytic administration- likely not a candidate for further lytics going forward per CCM. CCM MD OK with resuming heparin 4/10 AM. Hgb with slight dip 12.1>>11.1 in context of above. Will avoid bolus for now.  PM follow-up:  Heparin level 0.70 on 1800 units/hr.  Upper-end therapeutic. RN reports no further bleeding from chest tube, not on suction.   Goal of Therapy:  Heparin level 0.3-0.7 units/ml Monitor platelets by anticoagulation protocol: Yes   Plan:  Decrease heparin drip to 1750 units/hr Next heparin level and CBC in am.  Dennie Fetters, RPh 08/12/2023,6:06 PM

## 2023-08-12 NOTE — Progress Notes (Signed)
  Progress Note   Patient: Betty Zamora ZOX:[id number] DOB: Aug 08, 1955 DOA: 08/05/2023     7 DOS: the patient was seen and examined on 08/12/2023   Brief hospital course: 68 y.o. female with medical history significant of hypertension, vitamin D deficiency, PE on Eliquis, GERD and chronic kidney disease; who presented to the emergency department due to increasing shortness of breath.  Patient was recently discharged on 3/25 after noted to have acute right lower extremity DVT as well as PE and was discharged on Eliquis.  She is now noted to have interval development of large right pleural effusion with subtotal collapse of the right lung and is in need of thoracentesis.  She will therefore need transfer to [facility].   Assessment and Plan: Acute loculated R sided pleural effusion  - Pulmonary assistance with chest tube/lytics is appreciated - Oxycodone PRN    PE w/ pulmonary infarct  - IV heparin drip    Chronic HFpEF - Monitor; lasix discontinued by previous provider    CKD3b - Monitor  Subjective: Pt seen and examined at the bedside. Pt with bloody output via chest tube. CT chest reviewed which has shown improvement in the R sided pleural effusion.  Appreciate pulmonary following. H&H stable. Heparin drip held overnight but has been resumed this morning.  Physical Exam: Vitals:   08/11/23 2021 08/11/23 2115 08/12/23 0535 08/12/23 0717  BP: 127/74 132/75 122/74 123/81  Pulse: 91 81 78 80  Resp: 18 20 17 18   Temp: 99 F (37.2 C)  98.9 F (37.2 C) 98.4 F (36.9 C)  TempSrc: Oral  Oral Oral  SpO2: 98% 98% 99% 97%  Weight:      Height:       Constitutional:      Appearance: Normal appearance.  HENT:     Head: Normocephalic.     Mouth/Throat:     Mouth: Mucous membranes are moist.  Cardiovascular:     Rate and Rhythm: Normal rate and regular rhythm.  Pulmonary:     Comments: Improved aeration on R side  Abdominal:     Palpations: Abdomen is soft.  Musculoskeletal:         General: Normal range of motion.     Cervical back: Neck supple.  Skin:    General: Skin is warm.  Neurological:     Mental Status: She is alert. Mental status is at baseline.  Psychiatric:        Mood and Affect: Mood normal.      Disposition: Status is: Inpatient Remains inpatient appropriate because: Chest tube management   Planned Discharge Destination: Home    Time spent: 35 minutes  Author: Baron Hamper , MD 08/12/2023 11:04 AM  For on call review [email or web address].

## 2023-08-13 ENCOUNTER — Inpatient Hospital Stay (HOSPITAL_COMMUNITY)

## 2023-08-13 DIAGNOSIS — J90 Pleural effusion, not elsewhere classified: Secondary | ICD-10-CM | POA: Diagnosis not present

## 2023-08-13 LAB — CBC
HCT: 32.2 % — ABNORMAL LOW (ref 36.0–46.0)
Hemoglobin: 10.4 g/dL — ABNORMAL LOW (ref 12.0–15.0)
MCH: 31.8 pg (ref 26.0–34.0)
MCHC: 32.3 g/dL (ref 30.0–36.0)
MCV: 98.5 fL (ref 80.0–100.0)
Platelets: 481 10*3/uL — ABNORMAL HIGH (ref 150–400)
RBC: 3.27 MIL/uL — ABNORMAL LOW (ref 3.87–5.11)
RDW: 13.5 % (ref 11.5–15.5)
WBC: 9.7 10*3/uL (ref 4.0–10.5)
nRBC: 0 % (ref 0.0–0.2)

## 2023-08-13 LAB — HEPARIN LEVEL (UNFRACTIONATED)
Heparin Unfractionated: 0.49 [IU]/mL (ref 0.30–0.70)
Heparin Unfractionated: 0.71 [IU]/mL — ABNORMAL HIGH (ref 0.30–0.70)

## 2023-08-13 LAB — MAGNESIUM: Magnesium: 2.3 mg/dL (ref 1.7–2.4)

## 2023-08-13 LAB — COMPREHENSIVE METABOLIC PANEL WITH GFR
ALT: 17 U/L (ref 0–44)
AST: 17 U/L (ref 15–41)
Albumin: <1.5 g/dL — ABNORMAL LOW (ref 3.5–5.0)
Alkaline Phosphatase: 89 U/L (ref 38–126)
Anion gap: 10 (ref 5–15)
BUN: 42 mg/dL — ABNORMAL HIGH (ref 8–23)
CO2: 18 mmol/L — ABNORMAL LOW (ref 22–32)
Calcium: 8.2 mg/dL — ABNORMAL LOW (ref 8.9–10.3)
Chloride: 109 mmol/L (ref 98–111)
Creatinine, Ser: 1.9 mg/dL — ABNORMAL HIGH (ref 0.44–1.00)
GFR, Estimated: 29 mL/min — ABNORMAL LOW (ref >60)
Glucose, Bld: 97 mg/dL (ref 70–99)
Potassium: 5 mmol/L (ref 3.5–5.1)
Sodium: 137 mmol/L (ref 135–145)
Total Bilirubin: 0.4 mg/dL (ref 0.0–1.2)
Total Protein: 4.6 g/dL — ABNORMAL LOW (ref 6.5–8.1)

## 2023-08-13 LAB — C-REACTIVE PROTEIN: CRP: 12.1 mg/dL — ABNORMAL HIGH (ref <1.0)

## 2023-08-13 MED ORDER — ACETAMINOPHEN 325 MG PO TABS: 650.0000 mg | ORAL_TABLET | Freq: Four times a day (QID) | ORAL | Status: DC | PRN

## 2023-08-13 MED ORDER — TRAZODONE HCL 50 MG PO TABS: 50.0000 mg | ORAL_TABLET | Freq: Every evening | ORAL | Status: DC | PRN

## 2023-08-13 MED ORDER — SENNOSIDES-DOCUSATE SODIUM 8.6-50 MG PO TABS: 1.0000 | ORAL_TABLET | Freq: Every evening | ORAL | Status: DC | PRN

## 2023-08-13 MED ORDER — HYDRALAZINE HCL 20 MG/ML IJ SOLN: 10.0000 mg | INTRAMUSCULAR | Status: DC | PRN

## 2023-08-13 MED ORDER — PANTOPRAZOLE SODIUM 40 MG PO TBEC
40.0000 mg | DELAYED_RELEASE_TABLET | Freq: Every day | ORAL | Status: DC
  Administered 2023-08-13 – 2023-08-14 (×2): 40 mg via ORAL
  Filled 2023-08-13 (×2): qty 1

## 2023-08-13 MED ORDER — IPRATROPIUM-ALBUTEROL 0.5-2.5 (3) MG/3ML IN SOLN: 3.0000 mL | RESPIRATORY_TRACT | Status: DC | PRN

## 2023-08-13 MED ORDER — METOPROLOL TARTRATE 5 MG/5ML IV SOLN: 5.0000 mg | INTRAVENOUS | Status: DC | PRN

## 2023-08-13 NOTE — Progress Notes (Signed)
 PROGRESS NOTE    Betty Zamora  YQM:[id number] DOB: 01-23-1956 DOA: 08/05/2023 PCP: Assunta Found, MD    Brief Narrative:   68 y.o. female with medical history significant of hypertension, vitamin D deficiency, PE on Eliquis, GERD and chronic kidney disease; who presented to the emergency department due to increasing shortness of breath.  Patient was recently discharged on 3/25 after noted to have acute right lower extremity DVT as well as PE and was discharged on Eliquis.  She is now noted to have interval development of large right pleural effusion with subtotal collapse of the right lung and is in need of thoracentesis.  She will therefore need transfer to [facility].  IR performed thoracentesis, 1.5 L removed but eventually chest tube placed on 4/8.  Management per pulmonary, receiving lytics.  Chest tube was dislodged and removed overnight, repeat chest x-ray ordered.  Pulmonary following   Assessment & Plan:  Principal Problem:   Pleural effusion on right Active Problems:   Essential hypertension, benign   Pulmonary embolism (HCC)   Chronic kidney disease (CKD)   Thrombocytosis   Elevated troponin   Pulmonary infarct (HCC)   Pleural effusion   Acute right-sided loculated effusion, large - Initially thoracentesis performed 1.5 L removed by IR but no chest tube placed by pulmonary on 4/8 and received lytics.  Unfortunately overnight this was dislodged and removed 4/8.  Repeat chest x-ray and further management per pulmonary.   AKI on CKD stage II - Baseline creatinine 1.3, Today 2.02  Pulmonary embolism with pulmonary infarct - Currently on heparin drip  Congestive heart failure preserved EF - Does not appear to be on any medications  GERD - PPI  Essential hypertension - On Benicar outpatient.  IV as needed  PT/OT ordered  DVT prophylaxis: SCDs Start: 08/06/23 0459     Code Status: Full Code Family Communication:   Status is: Inpatient Remains inpatient  appropriate because: Continue hospital stay, management per pulmonary regarding effusion.    Subjective: Seen at the side, overnight chest tube got dislodged and removed was removed. Tells me overall she feels weak and wants physical therapy  Examination:  General exam: Appears calm and comfortable  Respiratory system: Some basilar crackles Cardiovascular system: S1 & S2 heard, RRR. No JVD, murmurs, rubs, gallops or clicks. No pedal edema. Gastrointestinal system: Abdomen is nondistended, soft and nontender. No organomegaly or masses felt. Normal bowel sounds heard. Central nervous system: Alert and oriented. No focal neurological deficits. Extremities: Symmetric 5 x 5 power. Skin: No rashes, lesions or ulcers Psychiatry: Judgement and insight appear normal. Mood & affect appropriate.                Diet Orders (From admission, onward)     Start     Ordered   08/06/23 0459  Diet Heart Room service appropriate? Yes; Fluid consistency: Thin  Diet effective now       Question Answer Comment  Room service appropriate? Yes   Fluid consistency: Thin      08/06/23 0500            Objective: Vitals:   08/12/23 2159 08/12/23 2357 08/13/23 0448 08/13/23 0852  BP: (!) 147/89 125/70 (!) 147/77 (!) 140/82  Pulse: 82 79 79 82  Resp: 16 16 16 16   Temp: 99.2 F (37.3 C) 98.7 F (37.1 C) 97.6 F (36.4 C) 98.1 F (36.7 C)  TempSrc: Oral Oral Oral Oral  SpO2: 97% 97% 96% 96%  Weight:  Height:        Intake/Output Summary (Last 24 hours) at 08/13/2023 1154 Last data filed at 08/12/2023 2101 Gross per 24 hour  Intake 620 ml  Output 500 ml  Net 120 ml   Filed Weights   08/05/23 1508 08/10/23 1340  Weight: 70.6 kg 70.6 kg    Scheduled Meds:  pantoprazole  40 mg Oral Daily   sodium chloride flush  10 mL Intrapleural Q8H   Continuous Infusions:  heparin 1,750 Units/hr (08/13/23 0029)    Nutritional status     Body mass index is 25.12 kg/m.  Data  Reviewed:   CBC: Recent Labs  Lab 08/10/23 0604 08/11/23 0625 08/11/23 1958 08/12/23 0629 08/13/23 0751  WBC 10.1 10.1 10.3 9.5 9.7  HGB 12.9 12.9 12.1 11.1* 10.4*  HCT 39.4 39.1 36.9 33.4* 32.2*  MCV 95.4 95.8 96.1 96.8 98.5  PLT 505* 515* 511* 481* 481*   Basic Metabolic Panel: Recent Labs  Lab 08/06/23 2348 08/08/23 0828 08/09/23 0121 08/10/23 0604 08/11/23 0625 08/12/23 0629 08/13/23 0751  NA 134* 138 136 137 136 136 137  K 4.5 5.1 4.3 4.5 4.8 4.9 5.0  CL 109 110 110 109 107 108 109  CO2 18* 21* 19* 18* 19* 18* 18*  GLUCOSE 109* 96 112* 92 91 95 97  BUN 33* 33* 34* 35* 37* 42* 42*  CREATININE 1.22* 1.17* 1.17* 1.30* 1.63* 2.02* 1.90*  CALCIUM 8.1* 8.4* 8.1* 8.5* 8.2* 8.3* 8.2*  MG 2.1 2.3 2.1  --   --  2.2 2.3   GFR: Estimated Creatinine Clearance: 26.9 mL/min (A) (by C-G formula based on SCr of 1.9 mg/dL (H)). Liver Function Tests: Recent Labs  Lab 08/12/23 0629 08/13/23 0751  AST 11* 17  ALT 14 17  ALKPHOS 85 89  BILITOT 0.2 0.4  PROT 4.4* 4.6*  ALBUMIN <1.5* <1.5*   No results for input(s): "LIPASE", "AMYLASE" in the last 168 hours. No results for input(s): "AMMONIA" in the last 168 hours. Coagulation Profile: Recent Labs  Lab 08/11/23 1958  INR 1.1   Cardiac Enzymes: No results for input(s): "CKTOTAL", "CKMB", "CKMBINDEX", "TROPONINI" in the last 168 hours. BNP (last 3 results) No results for input(s): "PROBNP" in the last 8760 hours. HbA1C: No results for input(s): "HGBA1C" in the last 72 hours. CBG: No results for input(s): "GLUCAP" in the last 168 hours. Lipid Profile: No results for input(s): "CHOL", "HDL", "LDLCALC", "TRIG", "CHOLHDL", "LDLDIRECT" in the last 72 hours. Thyroid Function Tests: No results for input(s): "TSH", "T4TOTAL", "FREET4", "T3FREE", "THYROIDAB" in the last 72 hours. Anemia Panel: No results for input(s): "VITAMINB12", "FOLATE", "FERRITIN", "TIBC", "IRON", "RETICCTPCT" in the last 72 hours. Sepsis Labs: No  results for input(s): "PROCALCITON", "LATICACIDVEN" in the last 168 hours.  Recent Results (from the past 240 hours)  Body fluid culture w Gram Stain     Status: None   Collection Time: 08/06/23  4:35 PM   Specimen: Lung, Right; Pleural Fluid  Result Value Ref Range Status   Specimen Description FLUID RIGHT  Final   Special Requests NONE  Final   Gram Stain   Final    FEW WBC PRESENT,BOTH PMN AND MONONUCLEAR NO ORGANISMS SEEN    Culture   Final    NO GROWTH 3 DAYS Performed at [facility], [address] [place], [place] [address]    Report Status 08/10/2023 FINAL  Final         Radiology Studies: DG CHEST PORT 1 VIEW Result Date:  08/13/2023 CLINICAL DATA:  Pleural effusion EXAM: PORTABLE CHEST 1 VIEW COMPARISON:  X-ray 08/13/2023 and older FINDINGS: Interval removal of the right basilar pigtail chest tube. Persistent right effusion with some bubbles of air in this location at this time. Adjacent opacities. Small left effusion with adjacent opacity. No large pneumothorax. No edema. Stable cardiopericardial silhouette. Overlapping cardiac leads. Degenerative changes along the spine. IMPRESSION: Interval movable of the right basilar pigtail catheter. Few bubbles of air in the pleural space at the right lung base but no larger pneumothorax. Electronically Signed   By: Karen Kays M.D.   On: 08/13/2023 10:58   DG Chest Port 1 View Result Date: 08/13/2023 CLINICAL DATA:  Follow-up chest tube. EXAM: PORTABLE CHEST 1 VIEW COMPARISON:  08/11/2023 FINDINGS: Right basilar pigtail thoracostomy tube is again noted in appears similar position to the previous exam. No pneumothorax identified. Stable cardiomediastinal contours. Lung volumes are low. Unchanged appearance of moderate right and small left pleural effusions. Atelectasis noted within both lung bases. Remote healed left posterior rib fractures are unchanged. IMPRESSION: 1. Stable position of right basilar pigtail thoracostomy tube.  No pneumothorax identified. 2. Unchanged appearance of moderate right and small left pleural effusions. 3. No change in aeration to the lower lungs compared with previous exam. Electronically Signed   By: Signa Kell M.D.   On: 08/13/2023 06:37   CT CHEST WO CONTRAST Result Date: 08/12/2023 CLINICAL DATA:  Pneumonia. EXAM: CT CHEST WITHOUT CONTRAST TECHNIQUE: Multidetector CT imaging of the chest was performed following the standard protocol without IV contrast. RADIATION DOSE REDUCTION: This exam was performed according to the departmental dose-optimization program which includes automated exposure control, adjustment of the mA and/or kV according to patient size and/or use of iterative reconstruction technique. COMPARISON:  08/05/2023 FINDINGS: Cardiovascular: The heart is normal in size. No pericardial effusion. Stable mild fusiform enlargement of the ascending thoracic aorta measuring 3.8 cm. Stable aortic and coronary artery calcifications. Mediastinum/Nodes: Scattered mediastinal and lymph nodes likely reactive given the lung findings. The esophagus is grossly. Lungs/Pleura: A right-sided pleural drainage catheter is in place with significant improvement of the large right pleural effusion seen on the prior study. Small residual pleural effusion and some fluid in the right major fissure. Dense wedge-shaped peripheral airspace consolidation in the right middle lobe likely pulmonary infarction. Similar findings in the left lower lobe. Small left pleural effusion. Streaky areas of subpleural atelectasis and/or pulmonary infiltrates. Upper Abdomen: No significant upper abdominal findings. Musculoskeletal: Interval development marked enlargement the left breast with extensive inflammations/edema possible hematoma. Findings could be due to trauma or mastitis. Recommend clinical correlation. No significant bony findings. IMPRESSION: 1. Right-sided pleural drainage catheter is in place with significant  improvement of the large right pleural effusion seen on the prior study. Small residual pleural effusion and some fluid in the right major fissure. 2. Dense wedge-shaped peripheral airspace consolidation in the right middle lobe likely pulmonary infarction. Similar findings in the left lower lobe. 3. Patchy areas of atelectasis and/or infiltrate. 4. Interval development of marked enlargement of the left breast with extensive inflammations/edema and possible hematoma. Findings could be due to trauma or mastitis. 5. Stable mild fusiform enlargement of the ascending thoracic aorta measuring 3.8 cm. 6. Aortic atherosclerosis. Electronically Signed   By: Rudie Meyer M.D.   On: 08/12/2023 09:21           LOS: 8 days   Time spent= 35 mins    Tymon Nemetz Zoila Shutter, MD [facility]  If 7PM-7AM, please  contact night-coverage  08/13/2023, 11:54 AM

## 2023-08-13 NOTE — Progress Notes (Addendum)
 PCCM Interval Progress Note:  Called to bedside for report of bleeding at patient's R-sided pigtail chest tube site. Of note, patient is on heparin gtt and has received pleural lytic therapy.  On my arrival, Rapid Response RN (Mindy) was present and redressing site post-CXR. Despite CXR appearing to have pigtail in place, tube had clearly migrated from prior with less tubing present in the pleural space (unable to tell from single portable view if still intrapleural or not). On site inspection, all black marking lines on pigtail were out of the skin and at least one hole on the pigtail itself was out of the skin with suction/air audible and slight bubbling. Pigtail chest tube was subsequently removed by Rapid RN and myself.  Repeat CXR ordered for 0900.  Tim Lair, PA-C [facility] 08/13/23 6:45 AM  Please see [email or web address] for pager details.  From 7A-7P if no response, please call [phone] After hours, please call ELink [phone]

## 2023-08-13 NOTE — Plan of Care (Signed)

## 2023-08-13 NOTE — Progress Notes (Signed)
 NAME:  Betty Zamora, MRN:  [id number], DOB:  March 26, 1956, LOS: 8 ADMISSION DATE:  08/05/2023, CONSULTATION DATE:  08/09/23 REFERRING MD:  Dr. Sharlene Dory, CHIEF COMPLAINT:  pleural effusion   History of Present Illness:   26 yoF with PMH of tobacco abuse, HTN, HFpEF, GERD, CKD and recent PE/ RLE DVT with pulmonary infarction requiring hospitalization 3/20> 3/25 discharged home on Eliquis.  Pt states she has had unchanged exertional dyspnea with some orthopnea since her first admit.  Repeat CT chest w/ contrast showed decreased in embolic burden but development of large right pleural effusion with subtotal collapse of right lung. Was admitted to [facility] and started on diureses and transitioned to heparin gtt.  Underwent IR thoracentesis on 4/4 with 1.5L of hazy amber fluid removed with some improvement on expansion noted on CXR, noted elevated LDH and neutrophil predominant, with culture ngtd.  Ongoing dyspnea, with repeat CXR showing increased effusion.  Back to IR 4/7 for repeat thora, drained only and noted to have loculations.  No hypoxia, currently on room air, afebrile, normal WBC.  PCCM called for further evaluation and treatment.    Pt reports reports BLE x 3 months but improved this admit after lasix.  Ongoing easily exertional dyspnea.   Denies any recent fever, chills, cough, sputum production, chest pain, or unintentional weight loss.  Quit smoking on her last admit, 37 year hx, 1 pack per week.  Was working full time in factory standing for 12hrs until recently but does admit to less activity given progressive BLE edema.   Pertinent  Medical History  HTN, recent PE/ RLE DVT on Eliquis, HFpEF, GERD, CKD IIIb, vit D  Significant Hospital Events: Including procedures, antibiotic start and stop dates in addition to other pertinent events   Recent admit 3/20-3/25 for PE w/ pulmonary infarction, RLE DVT d/c on Eliquis Admit TRH 4/4 large right pleural effusion, IR R thora 1.5L 4/7 IR right  thora 100 ml, noted loculations  4/8 chest tube placed 4/10 total of 500 since CT placed (put out 120 ml over last 24 hrs) CT showed dec'd pleural fluid.  4/11 chest tube malpositioned. Had to be removed.   Interim History / Subjective:  Right sided CP w/ deep breath    Objective   Blood pressure (!) 140/82, pulse 82, temperature 98.1 F (36.7 C), temperature source Oral, resp. rate 16, height 5\' 6"  (1.676 m), weight 70.6 kg, SpO2 96%.        Intake/Output Summary (Last 24 hours) at 08/13/2023 1132 Last data filed at 08/12/2023 2101 Gross per 24 hour  Intake 620 ml  Output 500 ml  Net 120 ml   Filed Weights   08/05/23 1508 08/10/23 1340  Weight: 70.6 kg 70.6 kg    Examination: General No distress HENT NCAT no JVD Pulm bibasilar crackles. No accessory use. Has pleuritic pain w/ deep inhalation pcxr this am. Tube out. Still has wedge shaped RUL consolidation. Pleural fluid does not look like it has accumulated. Does have two air bubbles in pleural space (prob from CT being pulled) Card rrr Abd soft Ext warm and dry  Neuro intact  Resolved Hospital Problem list    Assessment & Plan:   Right loculated pleural effusion- in context of PE, pulmonary infarct; inflammatory - Lytics x 2, w/ increased bloody output from CT noted on 4/10 so further lytics dc'd. Unfortunately tube inadvertently pulled out of pleural space early this am so had to be removed. Looks like she had put  out another 120 ml since day prior so board[place]line as far as weather of not will stay out -cxr w/ wedge shaped consolidation RUL. Looks the same.some bubble of air in pleural space but does not appear at this point that the fluid has re-accumulated significantly   Plan Cont IV heparin. Would not transition to oral in case we need to replace tube Repeat cxr now make sure no inc in pleural gas, has sig pleuritic pain still w/ deep breath  Repeat CXR in am watching for re-accumulation of pleural fluid

## 2023-08-13 NOTE — Significant Event (Signed)
 Rapid Response Event Note   Reason for Call :  Bleeding around chest tube site  Initial Focused Assessment:  Pt sitting up in bed in no visible distress. R post CT site with blood under tegaderm dressing. Dressing taken down to reveal CT side hole and sucking sound. CT d/c'd the rest of the way with PA Reese at bedside. Vaseline gauze and pressure dressing applied.   Interventions:  CT d/c'd PCXR at 0900 Ok to keep heparin gtt going Plan of Care:  Await PCXR results in the morning. Please call RRT if further assistance needed.   Event Summary:   MD Notified: Pecola Leisure, PCCM PA to bedside Call Time:0604 Arrival Time:0609 End [id number]  Terrilyn Saver, RN

## 2023-08-13 NOTE — Plan of Care (Signed)

## 2023-08-13 NOTE — TOC Progression Note (Signed)
 Transition of Care [facility]) - Progression Note    Patient Details  Name: Betty Zamora MRN: [id number] Date of Birth: 05-04-1956  Transition of [facility]) CM/SW Contact  Janae Bridgeman, RN Phone Number: 08/13/2023, 9:50 AM  Clinical Narrative:    CM noted that patient admitted from home and will likely discharge home once patient is medically stable for discharge.  Patient with current pleural effusion with Right sided CT placement in IR.  CM will continue to follow the patient for [facility] needs pending medical stability.     Barriers to Discharge: Continued Medical Work up  Expected Discharge Plan and Services                                               Social Determinants of Health (SDOH) Interventions SDOH Screenings   Food Insecurity: No Food Insecurity (08/06/2023)  Housing: Low Risk  (08/06/2023)  Transportation Needs: No Transportation Needs (08/06/2023)  Utilities: Not At Risk (08/06/2023)  Depression (PHQ2-9): Low Risk  (07/29/2023)  Social Connections: Unknown (08/06/2023)  Recent Concern: Social Connections - Moderately Isolated (07/23/2023)  Tobacco Use: Medium Risk (08/10/2023)    Readmission Risk Interventions    08/13/2023    9:49 AM  Readmission Risk Prevention Plan  Transportation Screening Complete  PCP or Specialist Appt within 5-7 Days Complete  Home Care Screening Complete  Medication Review (RN CM) Complete

## 2023-08-13 NOTE — Hospital Course (Addendum)
 Brief Narrative:   68 y.o. female with medical history significant of hypertension, vitamin D deficiency, PE on Eliquis, GERD and chronic kidney disease; who presented to the emergency department due to increasing shortness of breath.  Patient was recently discharged on 3/25 after noted to have acute right lower extremity DVT as well as PE and was discharged on Eliquis.  She is now noted to have interval development of large right pleural effusion with subtotal collapse of the right lung and is in need of thoracentesis.  She will therefore need transfer to [facility].  IR performed thoracentesis, 1.5 L removed but eventually chest tube placed on 4/8.  Management per pulmonary, receiving lytics.  Chest tube was dislodged and removed overnight, repeat chest x-ray ordered.  Pulmonary following   Assessment & Plan:  Principal Problem:   Pleural effusion on right Active Problems:   Essential hypertension, benign   Pulmonary embolism (HCC)   Chronic kidney disease (CKD)   Thrombocytosis   Elevated troponin   Pulmonary infarct (HCC)   Pleural effusion   Acute right-sided loculated effusion, large - Initially thoracentesis performed 1.5 L removed by IR but no chest tube placed by pulmonary on 4/8 and received lytics.  Unfortunately overnight this was dislodged and removed 4/8.  Repeat chest x-ray and further management per pulmonary.   AKI on CKD stage II - Baseline creatinine 1.3, Today 2.02  Pulmonary embolism with pulmonary infarct - Currently on heparin drip  Congestive heart failure preserved EF - Does not appear to be on any medications  GERD - PPI  Essential hypertension - On Benicar outpatient.  IV as needed  PT/OT ordered  DVT prophylaxis: SCDs Start: 08/06/23 0459     Code Status: Full Code Family Communication:   Status is: Inpatient Remains inpatient appropriate because: Continue hospital stay, management per pulmonary regarding effusion.    Subjective: Seen at  the side, overnight chest tube got dislodged and removed was removed. Tells me overall she feels weak and wants physical therapy  Examination:  General exam: Appears calm and comfortable  Respiratory system: Some basilar crackles Cardiovascular system: S1 & S2 heard, RRR. No JVD, murmurs, rubs, gallops or clicks. No pedal edema. Gastrointestinal system: Abdomen is nondistended, soft and nontender. No organomegaly or masses felt. Normal bowel sounds heard. Central nervous system: Alert and oriented. No focal neurological deficits. Extremities: Symmetric 5 x 5 power. Skin: No rashes, lesions or ulcers Psychiatry: Judgement and insight appear normal. Mood & affect appropriate.

## 2023-08-13 NOTE — Progress Notes (Signed)
 Xray tech notified nurse pt bleeding at chest tube site to R side of back, also told nurse that placement is correct. Nurse observed bloody dressing and some on sheets. Nurse stopped heparin. Changed dressing per order. Chest tube site has small amount amount of blood oozing from area. Charge notified oncall Toniann Fail, no pulmonologist is listed. Nurse called provider back at [phone]. Provider asked what floor, notified 2W. Nurse told provider that patient chest tube is area is bleeding and on continues heparin IV 17.5 ml/hr. Provider sked who the doctor is. Nurse repsonded Baron Hamper MD, he asked who that was. Nurse is unsure. Provider asked if it was still bleeding, nurse reponded it isnt bleeding at this moment, do you want me to reconnect suction doing anything with heparin. Provider said to contact the pulmonologist. Charge nurse made aware Called rapid response rn at 0605 to come look at patient

## 2023-08-13 NOTE — Progress Notes (Addendum)
 PHARMACY - ANTICOAGULATION CONSULT NOTE  Pharmacy Consult for Heparin Indication: pulmonary embolus  No Known Allergies  Patient Measurements: Height: 5\' 6"  (167.6 cm) Weight: 70.6 kg (155 lb 10.3 oz) IBW/kg (Calculated) : 59.3 HEPARIN DW (KG): 70.6  Vital Signs: Temp: 98.1 F (36.7 C) (04/11 0852) Temp Source: Oral (04/11 0852) BP: 140/82 (04/11 0852) Pulse Rate: 82 (04/11 0852)  Labs: Recent Labs    08/11/23 0625 08/11/23 1958 08/12/23 0629 08/12/23 1700 08/13/23 0751  HGB 12.9 12.1 11.1*  --  10.4*  HCT 39.1 36.9 33.4*  --  32.2*  PLT 515* 511* 481*  --  481*  APTT  --  54*  --   --   --   LABPROT  --  14.7  --   --   --   INR  --  1.1  --   --   --   HEPARINUNFRC 0.27*  --  <0.10* 0.70 0.49  CREATININE 1.63*  --  2.02*  --   --     Estimated Creatinine Clearance: 25.3 mL/min (A) (by C-G formula based on SCr of 2.02 mg/dL (H)).  Assessment: Patient is a 68 year old female recently diagnosed with a PE, on Eliquis. She presented to ED with SOB and reports being compliant to Eliquis therapy. Pharmacy was consulted to start patient on a heparin infusion. Will monitor via aPTT levels given recent apixaban use.   aPTT and heparin levels correlating - monitor heparin levels going forward.   4/10 AM: Heparin held overnight 4/9 d/t bloody drainage from chest tube following second round of lytic administration- likely not a candidate for further lytics going forward per CCM. CCM MD OK with resuming heparin 4/10 AM. Hgb with slight dip 12.1>>11.1 in context of above. Will avoid bolus for now.  4/11: heparin level 0.49 on 1750 units/hr. Chest tube site was bleeding and removed this morning. Hgb down to 10.4, platelets are elevated.   Goal of Therapy:  Heparin level 0.3-0.7 units/ml Monitor platelets by anticoagulation protocol: Yes   Plan:  Continue heparin drip at 1750 units/hr 6h confirmatory heparin level Daily heparin level, CBC Monitor for s/sx of  bleeding  Thank you for involving pharmacy in this patient's care.  Loura Back, PharmD, BCPS Clinical Pharmacist Clinical phone for 08/13/2023 is [phone] 08/13/2023 8:56 AM   Addendum: Repeat heparin level just above goal at 0.71 on 1750 units/hr.   Decrease heparin drip to 1650 units/hr. Check heparin level in am  [facility], PharmD, BCPS 2:50 PM

## 2023-08-13 NOTE — Progress Notes (Signed)
 Transition of Care [facility]) - Inpatient Brief Assessment   Patient Details  Name: Betty Zamora MRN: [id number] Date of Birth: 1955/05/26  Transition of Care [facility]) CM/SW Contact:    Janae Bridgeman, RN Phone Number: 08/13/2023, 3:18 PM   Clinical Narrative: CM met with the patient at the bedside and patient admitted from home to the hospital for Right pleural effusion.  Patient's right sided CT has been discontinued at this time and patient remains on RA.  No DME at the home and patient was independent and driving prior to hospitalization.  PT/OT eval is pending at this time.  CM will continue to follow the patient for [facility] needs pending PT/OT eval.   Transition of Care Asessment: Insurance and Status: Insurance coverage has been reviewed Patient has primary care physician: Yes Home environment has been reviewed: from home Prior level of function:: Independent Prior/Current Home Services: No current home services Social Drivers of Health Review: SDOH reviewed no interventions necessary Readmission risk has been reviewed: Yes Transition of care needs: no transition of care needs at this time

## 2023-08-14 ENCOUNTER — Inpatient Hospital Stay (HOSPITAL_COMMUNITY)

## 2023-08-14 DIAGNOSIS — R579 Shock, unspecified: Secondary | ICD-10-CM | POA: Diagnosis not present

## 2023-08-14 DIAGNOSIS — J90 Pleural effusion, not elsewhere classified: Secondary | ICD-10-CM | POA: Diagnosis not present

## 2023-08-14 DIAGNOSIS — I2699 Other pulmonary embolism without acute cor pulmonale: Secondary | ICD-10-CM

## 2023-08-14 LAB — TROPONIN I (HIGH SENSITIVITY): Troponin I (High Sensitivity): 74 ng/L — ABNORMAL HIGH (ref <18)

## 2023-08-14 LAB — GLUCOSE, CAPILLARY
Glucose-Capillary: 173 mg/dL — ABNORMAL HIGH (ref 70–99)
Glucose-Capillary: 168 mg/dL — ABNORMAL HIGH (ref 70–99)

## 2023-08-14 LAB — CBC
HCT: 25.1 % — ABNORMAL LOW (ref 36.0–46.0)
HCT: 12.1 % — ABNORMAL LOW (ref 36.0–46.0)
Hemoglobin: 8.1 g/dL — ABNORMAL LOW (ref 12.0–15.0)
Hemoglobin: 3.6 g/dL — CL (ref 12.0–15.0)
MCH: 31.8 pg (ref 26.0–34.0)
MCH: 32.4 pg (ref 26.0–34.0)
MCHC: 32.3 g/dL (ref 30.0–36.0)
MCHC: 29.8 g/dL — ABNORMAL LOW (ref 30.0–36.0)
MCV: 98.4 fL (ref 80.0–100.0)
MCV: 109 fL — ABNORMAL HIGH (ref 80.0–100.0)
Platelets: 504 10*3/uL — ABNORMAL HIGH (ref 150–400)
Platelets: 292 10*3/uL (ref 150–400)
RBC: 2.55 MIL/uL — ABNORMAL LOW (ref 3.87–5.11)
RBC: 1.11 MIL/uL — ABNORMAL LOW (ref 3.87–5.11)
RDW: 13.6 % (ref 11.5–15.5)
RDW: 13.7 % (ref 11.5–15.5)
WBC: 14.7 10*3/uL — ABNORMAL HIGH (ref 4.0–10.5)
WBC: 36.6 10*3/uL — ABNORMAL HIGH (ref 4.0–10.5)
nRBC: 0 % (ref 0.0–0.2)
nRBC: 0.2 % (ref 0.0–0.2)

## 2023-08-14 LAB — COMPREHENSIVE METABOLIC PANEL WITH GFR
ALT: 20 U/L (ref 0–44)
AST: 18 U/L (ref 15–41)
Albumin: <1.5 g/dL — ABNORMAL LOW (ref 3.5–5.0)
Alkaline Phosphatase: 80 U/L (ref 38–126)
Anion gap: 12 (ref 5–15)
BUN: 45 mg/dL — ABNORMAL HIGH (ref 8–23)
CO2: 17 mmol/L — ABNORMAL LOW (ref 22–32)
Calcium: 8.4 mg/dL — ABNORMAL LOW (ref 8.9–10.3)
Chloride: 107 mmol/L (ref 98–111)
Creatinine, Ser: 1.89 mg/dL — ABNORMAL HIGH (ref 0.44–1.00)
GFR, Estimated: 29 mL/min — ABNORMAL LOW (ref >60)
Glucose, Bld: 124 mg/dL — ABNORMAL HIGH (ref 70–99)
Potassium: 5.6 mmol/L — ABNORMAL HIGH (ref 3.5–5.1)
Sodium: 136 mmol/L (ref 135–145)
Total Bilirubin: 0.5 mg/dL (ref 0.0–1.2)
Total Protein: 4.5 g/dL — ABNORMAL LOW (ref 6.5–8.1)

## 2023-08-14 LAB — HEPARIN LEVEL (UNFRACTIONATED)
Heparin Unfractionated: >1.1 [IU]/mL — ABNORMAL HIGH (ref 0.30–0.70)
Heparin Unfractionated: 0.72 [IU]/mL — ABNORMAL HIGH (ref 0.30–0.70)

## 2023-08-14 LAB — PREPARE RBC (CROSSMATCH)

## 2023-08-14 LAB — MAGNESIUM: Magnesium: 2.3 mg/dL (ref 1.7–2.4)

## 2023-08-14 LAB — C-REACTIVE PROTEIN: CRP: 7.4 mg/dL — ABNORMAL HIGH (ref <1.0)

## 2023-08-14 LAB — LACTIC ACID, PLASMA
Lactic Acid, Venous: 5.5 mmol/L (ref 0.5–1.9)
Lactic Acid, Venous: >9 mmol/L (ref 0.5–1.9)

## 2023-08-14 MED ORDER — KETOROLAC TROMETHAMINE 15 MG/ML IJ SOLN
15.0000 mg | Freq: Once | INTRAMUSCULAR | Status: DC
  Filled 2023-08-14: qty 1

## 2023-08-14 MED ORDER — PIPERACILLIN-TAZOBACTAM 3.375 G IVPB
3.3750 g | Freq: Three times a day (TID) | INTRAVENOUS | Status: DC
  Administered 2023-08-14 – 2023-08-15 (×3): 3.375 g via INTRAVENOUS
  Filled 2023-08-14 (×3): qty 50

## 2023-08-14 MED ORDER — POLYETHYLENE GLYCOL 3350 17 G PO PACK: 17.0000 g | PACK | Freq: Every day | ORAL | Status: DC

## 2023-08-14 MED ORDER — VASOPRESSIN 20 UNITS/100 ML INFUSION FOR SHOCK
0.0000 [IU]/min | INTRAVENOUS | Status: DC
Start: 2023-08-14 — End: 2023-08-16
  Administered 2023-08-15 – 2023-08-16 (×2): 0.04 [IU]/min via INTRAVENOUS
  Filled 2023-08-14 (×4): qty 100

## 2023-08-14 MED ORDER — SODIUM BICARBONATE 8.4 % IV SOLN
INTRAVENOUS | Status: AC
  Administered 2023-08-14: 50 meq
  Filled 2023-08-14: qty 100

## 2023-08-14 MED ORDER — SODIUM CHLORIDE 0.9% IV SOLUTION: Freq: Once | INTRAVENOUS | Status: DC

## 2023-08-14 MED ORDER — MIDAZOLAM HCL 2 MG/2ML IJ SOLN
1.0000 mg | INTRAMUSCULAR | Status: DC | PRN
  Administered 2023-08-15 (×3): 2 mg via INTRAVENOUS
  Filled 2023-08-14 (×3): qty 2

## 2023-08-14 MED ORDER — STERILE WATER FOR INJECTION IV SOLN
INTRAVENOUS | Status: DC
  Filled 2023-08-14 (×6): qty 1000

## 2023-08-14 MED ORDER — EPINEPHRINE HCL 5 MG/250ML IV SOLN IN NS: 0.5000 ug/min | INTRAVENOUS | Status: DC

## 2023-08-14 MED ORDER — EPINEPHRINE HCL 5 MG/250ML IV SOLN IN NS
INTRAVENOUS | Status: AC
  Administered 2023-08-14: 5 ug/min via INTRAVENOUS
  Filled 2023-08-14: qty 250

## 2023-08-14 MED ORDER — SODIUM CHLORIDE 0.9 % IV BOLUS
1000.0000 mL | Freq: Once | INTRAVENOUS | Status: AC
  Administered 2023-08-14: 1000 mL via INTRAVENOUS

## 2023-08-14 MED ORDER — FENTANYL CITRATE PF 50 MCG/ML IJ SOSY
PREFILLED_SYRINGE | INTRAMUSCULAR | Status: AC
  Filled 2023-08-14: qty 2

## 2023-08-14 MED ORDER — NOREPINEPHRINE 4 MG/250ML-% IV SOLN
0.0000 ug/min | INTRAVENOUS | Status: DC
  Administered 2023-08-14: 7 ug/min via INTRAVENOUS
  Administered 2023-08-15: 30.133 ug/min via INTRAVENOUS
  Administered 2023-08-15: 30 ug/min via INTRAVENOUS
  Filled 2023-08-14 (×3): qty 250

## 2023-08-14 MED ORDER — DOCUSATE SODIUM 50 MG/5ML PO LIQD
100.0000 mg | Freq: Two times a day (BID) | ORAL | Status: DC
  Administered 2023-08-15: 100 mg
  Filled 2023-08-14: qty 10

## 2023-08-14 MED ORDER — ETOMIDATE 2 MG/ML IV SOLN
20.0000 mg | Freq: Once | INTRAVENOUS | Status: AC
  Administered 2023-08-14: 20 mg via INTRAVENOUS

## 2023-08-14 MED ORDER — MIDAZOLAM HCL 2 MG/2ML IJ SOLN
INTRAMUSCULAR | Status: AC
  Filled 2023-08-14: qty 2

## 2023-08-14 MED ORDER — FENTANYL CITRATE PF 50 MCG/ML IJ SOSY
50.0000 ug | PREFILLED_SYRINGE | Freq: Once | INTRAMUSCULAR | Status: AC
Start: 2023-08-14 — End: 2023-08-14
  Administered 2023-08-14: 50 ug via INTRAVENOUS

## 2023-08-14 MED ORDER — PHENYLEPHRINE HCL-NACL 20-0.9 MG/250ML-% IV SOLN
INTRAVENOUS | Status: AC
Start: 2023-08-14 — End: 2023-08-15
  Filled 2023-08-14: qty 250

## 2023-08-14 MED ORDER — NOREPINEPHRINE 4 MG/250ML-% IV SOLN: 0.0000 ug/min | INTRAVENOUS | Status: DC

## 2023-08-14 MED ORDER — HEPARIN (PORCINE) 25000 UT/250ML-% IV SOLN
1500.0000 [IU]/h | INTRAVENOUS | Status: DC
  Administered 2023-08-14: 1500 [IU]/h via INTRAVENOUS

## 2023-08-14 MED ORDER — MIDAZOLAM HCL 2 MG/2ML IJ SOLN
1.0000 mg | Freq: Once | INTRAMUSCULAR | Status: AC
  Administered 2023-08-14: 1 mg via INTRAVENOUS

## 2023-08-14 MED ORDER — CHLORHEXIDINE GLUCONATE CLOTH 2 % EX PADS
6.0000 | MEDICATED_PAD | Freq: Every day | CUTANEOUS | Status: DC
  Administered 2023-08-14 (×2): 6 via TOPICAL

## 2023-08-14 MED ORDER — NOREPINEPHRINE 4 MG/250ML-% IV SOLN
2.0000 ug/min | INTRAVENOUS | Status: DC
Start: 2023-08-14 — End: 2023-08-14
  Administered 2023-08-14: 2 ug/min via INTRAVENOUS

## 2023-08-14 MED ORDER — ETOMIDATE 2 MG/ML IV SOLN
INTRAVENOUS | Status: AC
  Filled 2023-08-14: qty 20

## 2023-08-14 MED ORDER — SODIUM CHLORIDE 0.9 % IV SOLN
250.0000 mL | INTRAVENOUS | Status: AC
  Administered 2023-08-15: 250 mL via INTRAVENOUS

## 2023-08-14 MED ORDER — ROCURONIUM BROMIDE 10 MG/ML (PF) SYRINGE
PREFILLED_SYRINGE | INTRAVENOUS | Status: AC
  Filled 2023-08-14: qty 10

## 2023-08-14 MED ORDER — FENTANYL CITRATE PF 50 MCG/ML IJ SOSY: 25.0000 ug | PREFILLED_SYRINGE | Freq: Once | INTRAMUSCULAR | Status: DC

## 2023-08-14 MED ORDER — FAMOTIDINE 20 MG PO TABS
20.0000 mg | ORAL_TABLET | Freq: Two times a day (BID) | ORAL | Status: DC
  Administered 2023-08-14: 20 mg
  Filled 2023-08-14: qty 1

## 2023-08-14 MED ORDER — PHENYLEPHRINE 80 MCG/ML (10ML) SYRINGE FOR IV PUSH (FOR BLOOD PRESSURE SUPPORT)
80.0000 ug | PREFILLED_SYRINGE | Freq: Once | INTRAVENOUS | Status: AC | PRN
  Administered 2023-08-14: 200 ug via INTRAVENOUS

## 2023-08-14 MED ORDER — FENTANYL 2500MCG IN NS 250ML (10MCG/ML) PREMIX INFUSION
0.0000 ug/h | INTRAVENOUS | Status: DC
  Administered 2023-08-14: 25 ug/h via INTRAVENOUS
  Administered 2023-08-15: 200 ug/h via INTRAVENOUS
  Filled 2023-08-14 (×3): qty 250

## 2023-08-14 MED ORDER — VASOPRESSIN 20 UNITS/100 ML INFUSION FOR SHOCK
INTRAVENOUS | Status: AC
  Administered 2023-08-14: 0.04 [IU]/min via INTRAVENOUS
  Filled 2023-08-14: qty 100

## 2023-08-14 MED ORDER — PHENYLEPHRINE 80 MCG/ML (10ML) SYRINGE FOR IV PUSH (FOR BLOOD PRESSURE SUPPORT)
PREFILLED_SYRINGE | INTRAVENOUS | Status: AC
  Filled 2023-08-14: qty 10

## 2023-08-14 MED ORDER — FENTANYL BOLUS VIA INFUSION
25.0000 ug | INTRAVENOUS | Status: DC | PRN
  Administered 2023-08-15 – 2023-08-16 (×5): 100 ug via INTRAVENOUS

## 2023-08-14 MED ORDER — NOREPINEPHRINE 4 MG/250ML-% IV SOLN
INTRAVENOUS | Status: AC
Start: 2023-08-14 — End: 2023-08-14
  Filled 2023-08-14: qty 250

## 2023-08-14 NOTE — Plan of Care (Signed)

## 2023-08-14 NOTE — Procedures (Signed)
 Arterial Catheter Insertion Procedure Note  Betty Zamora  [id number]  1955-08-16  Date:08/14/23  Time:7:58 PM    Provider Performing: Casimiro Cleaves    Procedure: Insertion of Arterial Line (08657) with US  guidance (84696)   Indication(s) Blood pressure monitoring and/or need for frequent ABGs  Consent Risks of the procedure as well as the alternatives and risks of each were explained to the patient and/or caregiver.  Consent for the procedure was obtained and is signed in the bedside chart  Anesthesia None   Time Out Verified patient identification, verified procedure, site/side was marked, verified correct patient position, special equipment/implants available, medications/allergies/relevant history reviewed, required imaging and test results available.   Sterile Technique Maximal sterile technique including full sterile barrier drape, hand hygiene, sterile gown, sterile gloves, mask, hair covering, sterile ultrasound probe cover (if used).   Procedure Description Area of catheter insertion was cleaned with chlorhexidine and draped in sterile fashion. With real-time ultrasound guidance an arterial catheter was placed into the right femoral artery.  Appropriate arterial tracings confirmed on monitor.     Complications/Tolerance None; patient tolerated the procedure well.   EBL Minimal   Specimen(s) None  JD Carliss Chess [facility] 08/14/2023, 7:59 PM  Please see [email or web address] for pager details.  From 7A-7P if no response, please call [phone]. After hours, please call ELink [phone].

## 2023-08-14 NOTE — Progress Notes (Signed)
 Progress Note   Patient: Betty Zamora NFA:[id number] DOB: 1955-11-10 DOA: 08/05/2023     9 DOS: the patient was seen and examined on 08/14/2023   Brief hospital course: 68 y.o. female with medical history significant of hypertension, vitamin D deficiency, PE on Eliquis, GERD and chronic kidney disease; who presented to the emergency department due to increasing shortness of breath.  Patient was recently discharged on 3/25 after noted to have acute right lower extremity DVT as well as PE and was discharged on Eliquis.  She is now noted to have interval development of large right pleural effusion with subtotal collapse of the right lung and is in need of thoracentesis.  She will therefore need transfer to [facility].  Status post thoracentesis with subsequent discovery of loculated effusion.  Chest tube placed.  Assessment and Plan:  Acute loculated right pleural effusion - S/P initial thoracentesis 4/4 with 1.5L hazy amber fluid removed.  Repeat attempt at thoracentesis 4/7 pulled only of fluid, but also noting loculations on US .  Chest tube placed 4/8 and subsequently received tPA.  Fortunately chest tube became dislodged and removed by next day.  Pulmonology following closely.  Possibility that patient may need chest tube reinserted.  Repeat imaging this morning personally reviewed noting persistent right-sided effusion with no real improvement.  Patient still complaining of intermittent pain and shortness of breath.  Will give IV Toradol x 1.  Will recheck CBC in AM.  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  Repeat CT angio noting no new PE, continued embolic burden, but with new right infarct.  Continues on heparin drip.  Currently not hypoxic.    Chronic HFpEF - Does not appear to be in acute exacerbation although may be contributing to patient's pleural effusion.  No lower extremity edema, BNP similar  to previous.  Discontinue IV Lasix 40 mg today as she appears intravascularly dry.  Monitor urine output recheck BMP in AM.  CKD 3B - Creatinine appears at baseline.  Monitor urine output recheck BMP in AM.  GERD - Protonix on board.  Hypertension - Resume home medication management.      Subjective: Patient resting comfortably this morning.  States that her chest pain is markedly nearly resolved this morning.  Still complaining of fullness and shortness of breath however.  Denies fever, chills, chest pain, nausea, vomiting, abdominal pain.  Denies any bleeding or coughing up blood.  Discussed the need for pulmonology to place catheter and attempt to break up the loculations.  Patient was understanding.  Physical Exam: Vitals:   08/14/23 0008 08/14/23 0519 08/14/23 0559 08/14/23 0735  BP: (!) 144/77 122/89 (!) 140/87 106/69  Pulse: 80  (!) 109 (!) 119  Resp: 16 16  18   Temp: 98.1 F (36.7 C) 97.9 F (36.6 C)  97.9 F (36.6 C)  TempSrc:    Oral  SpO2: 98% 100% 98% 100%  Weight:      Height:       GENERAL:  Alert, pleasant, no acute distress  HEENT:  EOMI, nasal cannula CARDIOVASCULAR: RRR RESPIRATORY: Poor air movement, dull on right base GASTROINTESTINAL:  Soft, nontender, nondistended EXTREMITIES:  No LE edema bilaterally NEURO:  No new focal deficits appreciated SKIN:  No rashes noted PSYCH:  Appropriate mood and affect   Data Reviewed:  Chest x-ray personally reviewed noting moderate right pleural effusion with compression atelectasis of the lung, no improvement from previous chest x-ray.  Family  Communication: None at bedside  Disposition: Status is: Inpatient Remains inpatient appropriate because: Thoracentesis for pleural effusion and PE  Planned Discharge Destination: Home    Time spent: 37 minutes  Author: Jodeane Mulligan, DO 08/14/2023 11:53 AM  For on call review [email or web address].

## 2023-08-14 NOTE — Procedures (Signed)
 Central Venous Catheter Insertion Procedure Note  Betty Zamora  [id number]  [birth year]  Date:08/14/23  Time:7:05 PM   Provider Performing:Bernie Ransford D. Harris   Procedure: Insertion of Non-tunneled Central Venous Catheter(36556) with US  guidance (35573)   Indication(s) Medication administration  Consent Unable to obtain consent due to emergent nature of procedure.  Anesthesia Topical only with 1% lidocaine   Timeout Verified patient identification, verified procedure, site/side was marked, verified correct patient position, special equipment/implants available, medications/allergies/relevant history reviewed, required imaging and test results available.  Sterile Technique Maximal sterile technique including full sterile barrier drape, hand hygiene, sterile gown, sterile gloves, mask, hair covering, sterile ultrasound probe cover (if used).  Procedure Description Area of catheter insertion was cleaned with chlorhexidine and draped in sterile fashion.  With real-time ultrasound guidance a central venous catheter was placed into the left internal jugular vein. Nonpulsatile blood flow and easy flushing noted in all ports.  The catheter was sutured in place and sterile dressing applied.  Complications/Tolerance None; patient tolerated the procedure well. Chest X-ray is ordered to verify placement for internal jugular or subclavian cannulation.   Chest x-ray is not ordered for femoral cannulation.  EBL Minimal  Specimen(s) None  Myrakle Wingler D. Harris, NP-C [place] Personal contact information can be found on Amion  If no contact or response made please call [phone] 08/14/2023, 7:06 PM

## 2023-08-14 NOTE — Progress Notes (Signed)
 PT Cancellation Note  Patient Details Name: Betty Zamora MRN: [id number] DOB: [birth year]   Cancelled Treatment:    Reason Eval/Treat Not Completed: Other (comment) Per RN requesting hold PT evaluation until after chest CT. Will continue to follow and evaluate as appropriate.   Barnabas Booth, PT, DPT   Acute Rehabilitation Department Office [phone] Secure Chat Communication Preferred   [name] 08/14/2023, 5:47 PM

## 2023-08-14 NOTE — Progress Notes (Signed)
 PHARMACY - ANTICOAGULATION CONSULT NOTE  Pharmacy Consult for Heparin Indication: pulmonary embolus  No Known Allergies  Patient Measurements: Height: 5\' 6"  (167.6 cm) Weight: 70.6 kg (155 lb 10.3 oz) IBW/kg (Calculated) : 59.3 HEPARIN DW (KG): 70.6  Vital Signs: Temp: 97.9 F (36.6 C) (04/12 0735) Temp Source: Oral (04/12 0735) BP: 106/69 (04/12 0735) Pulse Rate: 119 (04/12 0735)  Labs: Recent Labs    08/11/23 1958 08/12/23 0629 08/12/23 1700 08/13/23 0751 08/13/23 1258 08/14/23 0926  HGB 12.1 11.1*  --  10.4*  --  8.1*  HCT 36.9 33.4*  --  32.2*  --  25.1*  PLT 511* 481*  --  481*  --  504*  APTT 54*  --   --   --   --   --   LABPROT 14.7  --   --   --   --   --   INR 1.1  --   --   --   --   --   HEPARINUNFRC  --  <0.10*   < > 0.49 0.71* >1.10*  CREATININE  --  2.02*  --  1.90*  --  1.89*   < > = values in this interval not displayed.    Estimated Creatinine Clearance: 27 mL/min (A) (by C-G formula based on SCr of 1.89 mg/dL (H)).  Assessment: Patient is a 68 year old female recently diagnosed with a PE, on Eliquis. She presented to ED with SOB and reports being compliant to Eliquis therapy. Pharmacy was consulted to start patient on a heparin infusion. Will monitor via aPTT levels given recent apixaban use.   aPTT and heparin levels correlating - monitor heparin levels going forward.   4/10 AM: Heparin held overnight 4/9 d/t bloody drainage from chest tube following second round of lytic administration- likely not a candidate for further lytics going forward per CCM. CCM MD OK with resuming heparin 4/10 AM. Hgb with slight dip 12.1>>11.1 in context of above. Will avoid bolus for now.  4/12: Heparin level >1.1, supratherapeutic on 1650 units/hr. Confirmed level was drawn appropriately in opposite arm from heparin infusion. No bleeding reported or issues with infusion running per RN. Hgb decreased to 8.1, PLT 504.   Goal of Therapy:  Heparin level 0.3-0.7  units/ml Monitor platelets by anticoagulation protocol: Yes   Plan:  Hold heparin drip for 1 hour, then decrease heparin drip to 1500 units/hr. Check heparin level in 8 hours Daily heparin level and CBC Monitor for s/sx of bleeding daily  Volney Grumbles, PharmD PGY-1 Acute Care Pharmacy Resident 08/14/2023 10:54 AM

## 2023-08-14 NOTE — Progress Notes (Addendum)
 Called to bedside as patient had altered mental status and reportedly hypotension.  Blood pressure reportedly 74/48.  Upon arrival, patient appeared diaphoretic, mildly encephalopathic initially not responding or answering questions.  Recheck blood pressure in the 120s.  Glucose WNL.  Patient noted to be tachycardic with rate in the 100-120s.  Respiratory rate appeared increased.  Patient was able to answer simple questions, stating she was in no pain, not short of breath.  However had some difficulty following command when instructed to cough.  Seemed a bit confused.  GENERAL:  Alert but encephalopathic, mild distress HEENT:  EOMI, clammy CARDIOVASCULAR: Regular and tachycardic RESPIRATORY:  Clear to auscultation, mildly tachypneic GASTROINTESTINAL:  Soft, nontender, nondistended EXTREMITIES: Clammy, no LE edema bilaterally NEURO:  No new focal deficits appreciated SKIN:  No rashes noted PSYCH: Anxious  Since initial presentation, labs have returned.  Given encephalopathy noted above, leukocytosis, acidemia, encephalopathy, giving concern for sepsis.  Assessment and plan  Concern for sepsis - Hypotension, tachycardia, leukocytosis, cephalopathy, loculated effusion giving concern for sepsis.  Will order blood culture, IV fluid bolus, empiric Zosyn.  Will order lactate as well.  Monitor blood pressure closely.  Critical care time: 35 minutes

## 2023-08-14 NOTE — Progress Notes (Signed)
 OT Cancellation Note  Patient Details Name: Betty Zamora MRN: [id number] DOB: Oct 10, 1955   Cancelled Treatment:    Reason Eval/Treat Not Completed: Other (comment) (RN request hold as pt with pain at chest tube site and SOB with plans for stat chest CT)  Karilyn Ouch, OTR/L [facility] Acute Rehabilitation Office: [phone]   [name] 08/14/2023, 10:20 AM

## 2023-08-14 NOTE — Procedures (Signed)
 Intubation Procedure Note  Betty Zamora  [id number]  December 26, 1955  Date:08/14/23  Time:6:33 PM   Provider Performing:Ricard Faulkner Genetta Kenning, MD   Procedure: Intubation (31500)  Indication(s) Respiratory Failure  Consent Unable to obtain consent due to emergent nature of procedure. Verbally obtained consent from brother  Anesthesia Etomidate, Versed, and Fentanyl   Time Out Verified patient identification, verified procedure, site/side was marked, verified correct patient position, special equipment/implants available, medications/allergies/relevant history reviewed, required imaging and test results available.   Sterile Technique Usual hand hygeine, masks, and gloves were used   Procedure Description Patient positioned in bed supine.  Sedation given as noted above.  Patient was intubated with endotracheal tube using Glidescope.  View was Grade 1 full glottis .  Number of attempts was 1.  Colorimetric CO2 detector was consistent with tracheal placement.   Complications/Tolerance Peri-intubation hypotension. Given neo 160 mcg x 1 and repeated 240 mcg x 1 for SBP in 60s and 70s. Improved SBP 90s. Chest X-ray is ordered to verify placement.   EBL Minimal   Specimen(s) None

## 2023-08-14 NOTE — Significant Event (Addendum)
 Rapid Response Event Note   Reason for Call :  Hypotension,  BP 78/48 diaphoretic, cold extremities  Initial Focused Assessment:  Upon my arrival she is awake but slow to interact with staff.  She can follow commands  She denies fatigue, shortness of breath or chest pain.  Lung sounds decreased on right.  Heart tones regular She is cold to the touch but dry.  BP 98/60  ST 105-125  RR 20  O2 sat 98% on Nixa  Rectal temp 98.4  Dr Macarthur Savory at bedside  Interventions:  20 ga PIV Right AC placed 1L NS bolus  IV team placed PIV Right FA Zosyn started Continue Heparin gtt 12 lead EKG   Reassessment: She will answer questions appropriately and is able to move all extremities.  She is occasionally interactive and other times she is slow to interact. BP 90-100s HR 105-120.  RR 20  O2 sat 98% on 2-3 L  Dr Washington Hacker at bedside to assess patient  She had a brief episode of restlessness followed by labored breathing and RR 30s.  O2 sats remained 96-98%.  After a few minutes she was again calm with similar neuro status to earlier.  Lab at bedside to draw labs  About 1745 she became agonal and would respond and would move, but wasn't able to speak.  Placed on NRB Unable to obtain an O2 sat, Extremities are again cold to the touch and she is diaphoretic  BP 90/75  HR 114  Whitney NP at bedside, transfer orders to ICU obtained.  1810 Transferred to 3M07 with RT with plans for urgent intubation  Plan of Care:   Event Summary:   MD Notified: Dr Macarthur Savory at bedside Call Time: 1512 Arrival Time: 1514 End Time:  Camilo Cella, RN

## 2023-08-14 NOTE — Progress Notes (Signed)
 NAME:  Betty Zamora, MRN:  [id number], DOB:  Jul 26, 1955, LOS: 9 ADMISSION DATE:  08/05/2023, CONSULTATION DATE:  08/09/23 REFERRING MD:  Dr. Macarthur Savory, CHIEF COMPLAINT:  pleural effusion   History of Present Illness:   83 yoF with PMH of tobacco abuse, HTN, HFpEF, GERD, CKD and recent PE/ RLE DVT with pulmonary infarction requiring hospitalization 3/20> 3/25 discharged home on Eliquis.  Pt states she has had unchanged exertional dyspnea with some orthopnea since her first admit.  Repeat CT chest w/ contrast showed decreased in embolic burden but development of large right pleural effusion with subtotal collapse of right lung. Was admitted to [facility] and started on diureses and transitioned to heparin gtt.  Underwent IR thoracentesis on 4/4 with 1.5L of hazy amber fluid removed with some improvement on expansion noted on CXR, noted elevated LDH and neutrophil predominant, with culture ngtd.  Ongoing dyspnea, with repeat CXR showing increased effusion.  Back to IR 4/7 for repeat thora, drained only and noted to have loculations.  No hypoxia, currently on room air, afebrile, normal WBC.  PCCM called for further evaluation and treatment.    Pt reports reports BLE x 3 months but improved this admit after lasix.  Ongoing easily exertional dyspnea.   Denies any recent fever, chills, cough, sputum production, chest pain, or unintentional weight loss.  Quit smoking on her last admit, 37 year hx, 1 pack per week.  Was working full time in factory standing for 12hrs until recently but does admit to less activity given progressive BLE edema.   Pertinent  Medical History  HTN, recent PE/ RLE DVT on Eliquis, HFpEF, GERD, CKD IIIb, vit D  Significant Hospital Events: Including procedures, antibiotic start and stop dates in addition to other pertinent events   Prior admission 3/20 -2/25 Recent admit for PE w/ pulmonary infarction, RLE DVT d/c on Eliquis  Current admission  4/4 Admit [facility]  large right pleural  effusion, IR R thora 1.5L 4/7 IR right thora 100 ml, noted loculations  4/8 chest tube placed 4/10 total of 500 since CT placed (put out 120 ml over last 24 hrs) CT showed dec'd pleural fluid.  4/11 chest tube malpositioned. Had to be removed.  4/12 Chest x-ray remains stable, no acute events overnight  Interim History / Subjective:  Seen lying in bed with no acute complaints, flat affect appears withdrawn  Objective   Blood pressure 106/69, pulse (!) 119, temperature 97.9 F (36.6 C), temperature source Oral, resp. rate 18, height 5\' 6"  (1.676 m), weight 70.6 kg, SpO2 100%.       No intake or output data in the 24 hours ending 08/14/23 1131  Filed Weights   08/05/23 1508 08/10/23 1340  Weight: 70.6 kg 70.6 kg    Examination: General: Acute on chronic ill-appearing deconditioned middle-aged female lying in bed in no acute distress,  HEENT: ETT, MM pink/moist, PERRL,  Neuro: Alert and oriented x 3, difficult to engage in conversation/flat affect CV: s1s2 regular rate and rhythm, no murmur, rubs, or gallops,  PULM: Clear to auscultation bilaterally, no increased work of breathing, no added breath sounds GI: soft, bowel sounds active in all 4 quadrants, non-tender, non-distended, tolerating oral diet Extremities: warm/dry, no edema  Skin: no rashes or lesions  Resolved Hospital Problem list    Assessment & Plan:   Right loculated pleural effusion -In context of PE, pulmonary infarct; inflammatory - Lytics x 2, w/ increased bloody output from CT noted on 4/10 so further lytics  dc'd. Unfortunately tube inadvertently pulled out of pleural space early this am so had to be removed. Looks like she had put out another 120 ml since day prior so boarderline as far as weather of not will stay out -cxr w/ wedge shaped consolidation RUL. Looks the same.some bubble of air in pleural space but does not appear at this point that the fluid has re-accumulated significantly  P: CXR appears stable  but right pleural effusion persist will discuss with attending plan for management  Continue IV heparin drip   Signature:   Demetres Prochnow D. Harris, NP-C [place] [facility] Personal contact information can be found on Amion  If no contact or response made please call [phone] 08/14/2023, 11:37 AM

## 2023-08-14 NOTE — Progress Notes (Signed)
 eLink Physician-Brief Progress Note Patient Name: Betty Zamora DOB: 1955-12-22 MRN: [id number]   Date of Service  08/14/2023  HPI/Events of Note  68 year old with recent PE/DVT and a right-sided loculated pleural effusion status post chest tube and recent tPA.  She had intermittent abdominal pain but developed encephalopathy today and was transferred back to the ICU and shock with encephalopathy.  Notified of critical lab value-hemoglobin of 3.7.  Metabolic panel still pending.  Drawn off of arterial line and results verified.  Somewhat downtrending vasopressor requirements and no obvious signs of bleeding.  eICU Interventions  Ordered 2 units of PRBC emergent transfusion given the rapid decline.  Spoke with pharmacy, maintain holding heparin for the time being in the setting of her DVT/PE.  Assess for coagulopathy and trend hemoglobin tightly.  If we can stabilize her from a vasopressor requirement standpoint, we will strongly consider angiography to better assess sources of bleeding.  Maintain norepinephrine, vasopressin, and bicarbonate infusions for the time being.   0045 -ABG returned with critical pH, severe metabolic acidosis.  Blood pending.  Increase bicarb to 200 cc/h.  Appears that ground team has already been evaluating the patient and added bicarb push, calcium replacement/hyperkalemia protocol.  Switch PPI to IV twice daily  0115 -remaining BMP returned with evidence of K7.3.  Hyperkalemia protocol finished, will repeat ABG and CMP at 0230 timed  0324 -repeat labs show evidence of worsening transaminitis, persistent acidosis and hyperkalemia.  Repeat hyperkalemia protocol.  Obtain right upper quadrant ultrasound with liver Dopplers.  Echocardiogram pending.  Continue CVP monitoring  Intervention Category Major Interventions: Hypotension - evaluation and management Intermediate Interventions: Bleeding - evaluation and treatment with blood products  Kevyn Wengert 08/14/2023,  11:17 PM

## 2023-08-15 ENCOUNTER — Inpatient Hospital Stay (HOSPITAL_COMMUNITY)

## 2023-08-15 DIAGNOSIS — J9601 Acute respiratory failure with hypoxia: Secondary | ICD-10-CM

## 2023-08-15 DIAGNOSIS — Z86711 Personal history of pulmonary embolism: Secondary | ICD-10-CM | POA: Diagnosis not present

## 2023-08-15 DIAGNOSIS — J90 Pleural effusion, not elsewhere classified: Secondary | ICD-10-CM | POA: Diagnosis not present

## 2023-08-15 DIAGNOSIS — R570 Cardiogenic shock: Secondary | ICD-10-CM | POA: Diagnosis not present

## 2023-08-15 DIAGNOSIS — K7200 Acute and subacute hepatic failure without coma: Secondary | ICD-10-CM

## 2023-08-15 DIAGNOSIS — R579 Shock, unspecified: Secondary | ICD-10-CM | POA: Diagnosis not present

## 2023-08-15 DIAGNOSIS — I2699 Other pulmonary embolism without acute cor pulmonale: Secondary | ICD-10-CM | POA: Diagnosis not present

## 2023-08-15 DIAGNOSIS — Z7189 Other specified counseling: Secondary | ICD-10-CM

## 2023-08-15 LAB — COMPREHENSIVE METABOLIC PANEL WITH GFR
ALT: 257 U/L — ABNORMAL HIGH (ref 0–44)
ALT: 625 U/L — ABNORMAL HIGH (ref 0–44)
AST: 472 U/L — ABNORMAL HIGH (ref 15–41)
AST: 1193 U/L — ABNORMAL HIGH (ref 15–41)
Albumin: <1.5 g/dL — ABNORMAL LOW (ref 3.5–5.0)
Albumin: <1.5 g/dL — ABNORMAL LOW (ref 3.5–5.0)
Alkaline Phosphatase: 65 U/L (ref 38–126)
Alkaline Phosphatase: 83 U/L (ref 38–126)
Anion gap: 18 — ABNORMAL HIGH (ref 5–15)
BUN: 47 mg/dL — ABNORMAL HIGH (ref 8–23)
BUN: 48 mg/dL — ABNORMAL HIGH (ref 8–23)
CO2: <7 mmol/L — ABNORMAL LOW (ref 22–32)
CO2: 10 mmol/L — ABNORMAL LOW (ref 22–32)
Calcium: 7.5 mg/dL — ABNORMAL LOW (ref 8.9–10.3)
Calcium: 7.3 mg/dL — ABNORMAL LOW (ref 8.9–10.3)
Chloride: 105 mmol/L (ref 98–111)
Chloride: 105 mmol/L (ref 98–111)
Creatinine, Ser: 2.99 mg/dL — ABNORMAL HIGH (ref 0.44–1.00)
Creatinine, Ser: 3.02 mg/dL — ABNORMAL HIGH (ref 0.44–1.00)
GFR, Estimated: 17 mL/min — ABNORMAL LOW (ref >60)
GFR, Estimated: 16 mL/min — ABNORMAL LOW (ref >60)
Glucose, Bld: 346 mg/dL — ABNORMAL HIGH (ref 70–99)
Glucose, Bld: 458 mg/dL — ABNORMAL HIGH (ref 70–99)
Potassium: 7.3 mmol/L (ref 3.5–5.1)
Potassium: 6.2 mmol/L — ABNORMAL HIGH (ref 3.5–5.1)
Sodium: 131 mmol/L — ABNORMAL LOW (ref 135–145)
Sodium: 133 mmol/L — ABNORMAL LOW (ref 135–145)
Total Bilirubin: 0.5 mg/dL (ref 0.0–1.2)
Total Bilirubin: 0.7 mg/dL (ref 0.0–1.2)
Total Protein: <3 g/dL — ABNORMAL LOW (ref 6.5–8.1)
Total Protein: <3 g/dL — ABNORMAL LOW (ref 6.5–8.1)

## 2023-08-15 LAB — COOXEMETRY PANEL
Carboxyhemoglobin: 1 % (ref 0.5–1.5)
Methemoglobin: <0.7 % (ref 0.0–1.5)
O2 Saturation: 47.4 %
Total hemoglobin: 9.7 g/dL — ABNORMAL LOW (ref 12.0–16.0)

## 2023-08-15 LAB — BASIC METABOLIC PANEL WITH GFR
Anion gap: 19 — ABNORMAL HIGH (ref 5–15)
Anion gap: 18 — ABNORMAL HIGH (ref 5–15)
BUN: 49 mg/dL — ABNORMAL HIGH (ref 8–23)
BUN: 48 mg/dL — ABNORMAL HIGH (ref 8–23)
CO2: 9 mmol/L — ABNORMAL LOW (ref 22–32)
CO2: 14 mmol/L — ABNORMAL LOW (ref 22–32)
Calcium: 7.3 mg/dL — ABNORMAL LOW (ref 8.9–10.3)
Calcium: 7.8 mg/dL — ABNORMAL LOW (ref 8.9–10.3)
Chloride: 104 mmol/L (ref 98–111)
Chloride: 98 mmol/L (ref 98–111)
Creatinine, Ser: 2.99 mg/dL — ABNORMAL HIGH (ref 0.44–1.00)
Creatinine, Ser: 3.09 mg/dL — ABNORMAL HIGH (ref 0.44–1.00)
GFR, Estimated: 17 mL/min — ABNORMAL LOW (ref >60)
GFR, Estimated: 16 mL/min — ABNORMAL LOW (ref >60)
Glucose, Bld: 450 mg/dL — ABNORMAL HIGH (ref 70–99)
Glucose, Bld: 236 mg/dL — ABNORMAL HIGH (ref 70–99)
Potassium: 6.1 mmol/L — ABNORMAL HIGH (ref 3.5–5.1)
Potassium: 5.6 mmol/L — ABNORMAL HIGH (ref 3.5–5.1)
Sodium: 132 mmol/L — ABNORMAL LOW (ref 135–145)
Sodium: 130 mmol/L — ABNORMAL LOW (ref 135–145)

## 2023-08-15 LAB — POCT I-STAT 7, (LYTES, BLD GAS, ICA,H+H)
Acid-base deficit: 13 mmol/L — ABNORMAL HIGH (ref 0.0–2.0)
Acid-base deficit: 26 mmol/L — ABNORMAL HIGH (ref 0.0–2.0)
Bicarbonate: 12.2 mmol/L — ABNORMAL LOW (ref 20.0–28.0)
Bicarbonate: 4.9 mmol/L — ABNORMAL LOW (ref 20.0–28.0)
Calcium, Ion: 1.06 mmol/L — ABNORMAL LOW (ref 1.15–1.40)
Calcium, Ion: 1.08 mmol/L — ABNORMAL LOW (ref 1.15–1.40)
HCT: 19 % — ABNORMAL LOW (ref 36.0–46.0)
HCT: 23 % — ABNORMAL LOW (ref 36.0–46.0)
Hemoglobin: 6.5 g/dL — CL (ref 12.0–15.0)
Hemoglobin: 7.8 g/dL — ABNORMAL LOW (ref 12.0–15.0)
O2 Saturation: 100 %
O2 Saturation: 99 %
Patient temperature: 97.5
Patient temperature: 98.6
Potassium: 5.7 mmol/L — ABNORMAL HIGH (ref 3.5–5.1)
Potassium: 6.3 mmol/L (ref 3.5–5.1)
Sodium: 129 mmol/L — ABNORMAL LOW (ref 135–145)
Sodium: 131 mmol/L — ABNORMAL LOW (ref 135–145)
TCO2: 13 mmol/L — ABNORMAL LOW (ref 22–32)
TCO2: 6 mmol/L — ABNORMAL LOW (ref 22–32)
pCO2 arterial: 25.7 mmHg — ABNORMAL LOW (ref 32–48)
pCO2 arterial: 26.5 mmHg — ABNORMAL LOW (ref 32–48)
pH, Arterial: 6.883 — CL (ref 7.35–7.45)
pH, Arterial: 7.272 — ABNORMAL LOW (ref 7.35–7.45)
pO2, Arterial: 154 mmHg — ABNORMAL HIGH (ref 83–108)
pO2, Arterial: 539 mmHg — ABNORMAL HIGH (ref 83–108)

## 2023-08-15 LAB — CBC
HCT: 24.1 % — ABNORMAL LOW (ref 36.0–46.0)
HCT: 24.6 % — ABNORMAL LOW (ref 36.0–46.0)
Hemoglobin: 8.3 g/dL — ABNORMAL LOW (ref 12.0–15.0)
Hemoglobin: 8.6 g/dL — ABNORMAL LOW (ref 12.0–15.0)
MCH: 30.6 pg (ref 26.0–34.0)
MCH: 30.4 pg (ref 26.0–34.0)
MCHC: 34.4 g/dL (ref 30.0–36.0)
MCHC: 35 g/dL (ref 30.0–36.0)
MCV: 88.9 fL (ref 80.0–100.0)
MCV: 86.9 fL (ref 80.0–100.0)
Platelets: 185 10*3/uL (ref 150–400)
Platelets: 221 10*3/uL (ref 150–400)
RBC: 2.71 MIL/uL — ABNORMAL LOW (ref 3.87–5.11)
RBC: 2.83 MIL/uL — ABNORMAL LOW (ref 3.87–5.11)
RDW: 15.1 % (ref 11.5–15.5)
RDW: 15.7 % — ABNORMAL HIGH (ref 11.5–15.5)
WBC: 28.7 10*3/uL — ABNORMAL HIGH (ref 4.0–10.5)
WBC: 23.9 10*3/uL — ABNORMAL HIGH (ref 4.0–10.5)
nRBC: 0.5 % — ABNORMAL HIGH (ref 0.0–0.2)
nRBC: 0.6 % — ABNORMAL HIGH (ref 0.0–0.2)

## 2023-08-15 LAB — ECHOCARDIOGRAM LIMITED
Height: 66 in
Weight: 2490.32 [oz_av]

## 2023-08-15 LAB — DIC (DISSEMINATED INTRAVASCULAR COAGULATION)PANEL
D-Dimer, Quant: 7.84 ug{FEU}/mL — ABNORMAL HIGH (ref 0.00–0.50)
Fibrinogen: 449 mg/dL (ref 210–475)
INR: 2.2 — ABNORMAL HIGH (ref 0.8–1.2)
Platelets: 179 10*3/uL (ref 150–400)
Prothrombin Time: 24.4 s — ABNORMAL HIGH (ref 11.4–15.2)
Smear Review: NONE SEEN
aPTT: 111 s — ABNORMAL HIGH (ref 24–36)

## 2023-08-15 LAB — MAGNESIUM: Magnesium: 2.5 mg/dL — ABNORMAL HIGH (ref 1.7–2.4)

## 2023-08-15 LAB — GLUCOSE, CAPILLARY
Glucose-Capillary: 124 mg/dL — ABNORMAL HIGH (ref 70–99)
Glucose-Capillary: 142 mg/dL — ABNORMAL HIGH (ref 70–99)
Glucose-Capillary: 221 mg/dL — ABNORMAL HIGH (ref 70–99)
Glucose-Capillary: 253 mg/dL — ABNORMAL HIGH (ref 70–99)
Glucose-Capillary: 292 mg/dL — ABNORMAL HIGH (ref 70–99)

## 2023-08-15 LAB — HEPATITIS PANEL, ACUTE
HCV Ab: NONREACTIVE
Hep A IgM: NONREACTIVE
Hep B C IgM: NONREACTIVE
Hepatitis B Surface Ag: NONREACTIVE

## 2023-08-15 LAB — C-REACTIVE PROTEIN: CRP: 4.2 mg/dL — ABNORMAL HIGH (ref <1.0)

## 2023-08-15 LAB — LACTIC ACID, PLASMA
Lactic Acid, Venous: 8 mmol/L (ref 0.5–1.9)
Lactic Acid, Venous: 8.7 mmol/L (ref 0.5–1.9)

## 2023-08-15 LAB — HEMOGLOBIN AND HEMATOCRIT, BLOOD
HCT: 23.6 % — ABNORMAL LOW (ref 36.0–46.0)
HCT: 23.7 % — ABNORMAL LOW (ref 36.0–46.0)
Hemoglobin: 8 g/dL — ABNORMAL LOW (ref 12.0–15.0)
Hemoglobin: 8.5 g/dL — ABNORMAL LOW (ref 12.0–15.0)

## 2023-08-15 MED ORDER — VANCOMYCIN HCL 1500 MG/300ML IV SOLN
1500.0000 mg | INTRAVENOUS | Status: AC
  Administered 2023-08-15: 1500 mg via INTRAVENOUS
  Filled 2023-08-15: qty 300

## 2023-08-15 MED ORDER — PIPERACILLIN-TAZOBACTAM IN DEX 2-0.25 GM/50ML IV SOLN
2.2500 g | Freq: Three times a day (TID) | INTRAVENOUS | Status: DC
  Administered 2023-08-15 – 2023-08-16 (×3): 2.25 g via INTRAVENOUS
  Filled 2023-08-15 (×4): qty 50

## 2023-08-15 MED ORDER — SENNOSIDES-DOCUSATE SODIUM 8.6-50 MG PO TABS: 1.0000 | ORAL_TABLET | Freq: Every evening | ORAL | Status: DC | PRN

## 2023-08-15 MED ORDER — CALCIUM GLUCONATE-NACL 1-0.675 GM/50ML-% IV SOLN: 1.0000 g | Freq: Once | INTRAVENOUS | Status: DC

## 2023-08-15 MED ORDER — ACETAMINOPHEN 325 MG PO TABS: 650.0000 mg | ORAL_TABLET | Freq: Four times a day (QID) | ORAL | Status: DC | PRN

## 2023-08-15 MED ORDER — SODIUM ZIRCONIUM CYCLOSILICATE 10 G PO PACK
10.0000 g | PACK | Freq: Three times a day (TID) | ORAL | Status: DC
  Administered 2023-08-15 (×3): 10 g
  Filled 2023-08-15 (×3): qty 1

## 2023-08-15 MED ORDER — DEXTROSE 50 % IV SOLN
1.0000 | Freq: Once | INTRAVENOUS | Status: AC
  Administered 2023-08-15: 50 mL via INTRAVENOUS
  Filled 2023-08-15: qty 50

## 2023-08-15 MED ORDER — INSULIN ASPART 100 UNIT/ML IV SOLN
10.0000 [IU] | Freq: Once | INTRAVENOUS | Status: AC
  Administered 2023-08-15: 10 [IU] via INTRAVENOUS

## 2023-08-15 MED ORDER — LACTATED RINGERS IV BOLUS
1000.0000 mL | Freq: Once | INTRAVENOUS | Status: AC
  Administered 2023-08-15: 1000 mL via INTRAVENOUS

## 2023-08-15 MED ORDER — PANTOPRAZOLE SODIUM 40 MG IV SOLR
40.0000 mg | Freq: Two times a day (BID) | INTRAVENOUS | Status: DC
Start: 2023-08-15 — End: 2023-08-16
  Administered 2023-08-15 (×3): 40 mg via INTRAVENOUS
  Filled 2023-08-15 (×3): qty 10

## 2023-08-15 MED ORDER — HYDROCORTISONE SOD SUC (PF) 100 MG IJ SOLR
100.0000 mg | Freq: Three times a day (TID) | INTRAMUSCULAR | Status: DC
  Administered 2023-08-15 – 2023-08-16 (×3): 100 mg via INTRAVENOUS
  Filled 2023-08-15 (×3): qty 2

## 2023-08-15 MED ORDER — INSULIN ASPART 100 UNIT/ML IJ SOLN
0.0000 [IU] | INTRAMUSCULAR | Status: DC
  Administered 2023-08-15: 5 [IU] via SUBCUTANEOUS

## 2023-08-15 MED ORDER — CALCIUM GLUCONATE-NACL 1-0.675 GM/50ML-% IV SOLN
1.0000 g | Freq: Once | INTRAVENOUS | Status: AC
  Administered 2023-08-15: 1000 mg via INTRAVENOUS
  Filled 2023-08-15: qty 50

## 2023-08-15 MED ORDER — NOREPINEPHRINE 16 MG/250ML-% IV SOLN
0.0000 ug/min | INTRAVENOUS | Status: DC
  Administered 2023-08-15 (×2): 40 ug/min via INTRAVENOUS
  Administered 2023-08-15: 30 ug/min via INTRAVENOUS
  Administered 2023-08-16: 18 ug/min via INTRAVENOUS
  Filled 2023-08-15 (×3): qty 250

## 2023-08-15 MED ORDER — SODIUM BICARBONATE 8.4 % IV SOLN
100.0000 meq | Freq: Once | INTRAVENOUS | Status: AC
  Administered 2023-08-15: 100 meq via INTRAVENOUS

## 2023-08-15 MED ORDER — ONDANSETRON HCL 4 MG PO TABS: 4.0000 mg | ORAL_TABLET | Freq: Four times a day (QID) | ORAL | Status: DC | PRN

## 2023-08-15 MED ORDER — CALCIUM GLUCONATE-NACL 2-0.675 GM/100ML-% IV SOLN
2.0000 g | Freq: Once | INTRAVENOUS | Status: AC
  Administered 2023-08-15: 2000 mg via INTRAVENOUS
  Filled 2023-08-15: qty 100

## 2023-08-15 MED ORDER — ALBUTEROL SULFATE (2.5 MG/3ML) 0.083% IN NEBU
10.0000 mg | INHALATION_SOLUTION | Freq: Once | RESPIRATORY_TRACT | Status: AC
  Administered 2023-08-15: 10 mg via RESPIRATORY_TRACT
  Filled 2023-08-15: qty 12

## 2023-08-15 MED ORDER — SODIUM ZIRCONIUM CYCLOSILICATE 10 G PO PACK: 10.0000 g | PACK | Freq: Once | ORAL | Status: DC

## 2023-08-15 MED ORDER — ONDANSETRON HCL 4 MG/2ML IJ SOLN: 4.0000 mg | Freq: Four times a day (QID) | INTRAMUSCULAR | Status: DC | PRN

## 2023-08-15 MED ORDER — DEXTROSE 50 % IV SOLN
INTRAVENOUS | Status: AC
  Filled 2023-08-15: qty 50

## 2023-08-15 MED ORDER — VANCOMYCIN VARIABLE DOSE PER UNSTABLE RENAL FUNCTION (PHARMACIST DOSING): Status: DC

## 2023-08-15 MED ORDER — INSULIN ASPART 100 UNIT/ML IV SOLN
5.0000 [IU] | Freq: Once | INTRAVENOUS | Status: AC
  Administered 2023-08-15: 5 [IU] via INTRAVENOUS

## 2023-08-15 MED ORDER — SODIUM ZIRCONIUM CYCLOSILICATE 10 G PO PACK
10.0000 g | PACK | Freq: Once | ORAL | Status: AC
  Administered 2023-08-15: 10 g
  Filled 2023-08-15: qty 1

## 2023-08-15 NOTE — Plan of Care (Signed)
   Problem: Education: Goal: Knowledge of General Education information will improve Description: Including pain rating scale, medication(s)/side effects and non-pharmacologic comfort measures Outcome: Progressing   Problem: Health Behavior/Discharge Planning: Goal: Ability to manage health-related needs will improve Outcome: Progressing   Problem: Clinical Measurements: Goal: Ability to maintain clinical measurements within normal limits will improve Outcome: Progressing Goal: Will remain free from infection Outcome: Progressing Goal: Diagnostic test results will improve Outcome: Progressing Goal: Respiratory complications will improve Outcome: Progressing Goal: Cardiovascular complication will be avoided Outcome: Progressing   Problem: Activity: Goal: Risk for activity intolerance will decrease Outcome: Progressing   Problem: Nutrition: Goal: Adequate nutrition will be maintained Outcome: Progressing   Problem: Coping: Goal: Level of anxiety will decrease Outcome: Progressing   Problem: Elimination: Goal: Will not experience complications related to bowel motility Outcome: Progressing Goal: Will not experience complications related to urinary retention Outcome: Progressing   Problem: Pain Managment: Goal: General experience of comfort will improve and/or be controlled Outcome: Progressing   Problem: Safety: Goal: Ability to remain free from injury will improve Outcome: Progressing   Problem: Skin Integrity: Goal: Risk for impaired skin integrity will decrease Outcome: Progressing   Problem: Activity: Goal: Ability to tolerate increased activity will improve Outcome: Progressing   Problem: Respiratory: Goal: Ability to maintain a clear airway and adequate ventilation will improve Outcome: Progressing   Problem: Role Relationship: Goal: Method of communication will improve Outcome: Progressing   Problem: Education: Goal: Ability to describe self-care  measures that may prevent or decrease complications (Diabetes Survival Skills Education) will improve Outcome: Progressing Goal: Individualized Educational Video(s) Outcome: Progressing   Problem: Coping: Goal: Ability to adjust to condition or change in health will improve Outcome: Progressing   Problem: Fluid Volume: Goal: Ability to maintain a balanced intake and output will improve Outcome: Progressing   Problem: Health Behavior/Discharge Planning: Goal: Ability to identify and utilize available resources and services will improve Outcome: Progressing Goal: Ability to manage health-related needs will improve Outcome: Progressing   Problem: Metabolic: Goal: Ability to maintain appropriate glucose levels will improve Outcome: Progressing   Problem: Nutritional: Goal: Maintenance of adequate nutrition will improve Outcome: Progressing Goal: Progress toward achieving an optimal weight will improve Outcome: Progressing   Problem: Skin Integrity: Goal: Risk for impaired skin integrity will decrease Outcome: Progressing   Problem: Tissue Perfusion: Goal: Adequacy of tissue perfusion will improve Outcome: Progressing

## 2023-08-15 NOTE — Progress Notes (Signed)
 Echocardiogram 2D Echocardiogram has been performed.  Emmaline Haring Larz Mark RDCS 08/15/2023, 2:44 PM

## 2023-08-15 NOTE — Progress Notes (Signed)
 Venous duplex lower extremity  has been completed. Refer to [facility] under chart review to view preliminary results.   08/15/2023  12:27 PM Buster Schueller, Hollace Lund

## 2023-08-15 NOTE — IPAL (Signed)
  Interdisciplinary Goals of Care Family Meeting   Date carried out: 08/15/2023  Location of the meeting: Bedside  Member's involved: Physician and Family Member or next of kin  Durable Power of Attorney or Environmental health practitioner:     Discussion: We discussed goals of care for Betty Zamora . Updated brother and sister Betty Zamora on her critical condition. Given her PE and cardiogenic shock and bleed our medical options are limited to supportive care. She is too unstable for imaging at this point to identify source of suspected bleed. After discussion, family agreed to DNR but would like to continue supportive measures.  Code status:   Code Status: Limited: Do not attempt resuscitation (DNR) -DNR-LIMITED -Do Not Intubate/DNI    Disposition: Continue current acute care  Time spent for the meeting: 20 min    Betty Wellen Genetta Kenning, MD  08/15/2023, 10:38 AM

## 2023-08-15 NOTE — Consult Note (Signed)
 [facility] Renal Consultation Note  Requesting MD: Washington Hacker Indication for Consultation:  AKI  HPI:  Betty Zamora is a 68 y.o. female with past medical history significant for HTN, HFpEF as well as a hospitalization from 3/20-3/25 for a PE/DVT with pulmonary infarction started on eliquis at that time.  She returned on 4/3 with continued SOB- found to have a large right sided pleural effusion-  had thoracentesis but then eventually req a chest tube on 4/8 for loculated effusion.  Then on 4/12 pt had acute decompensation -  transferred to ICU- intubated , pressors-  suffered MSOF.  Regarding kidney function -  when left hosp on 3/26 -  crt 1.26-  and on 4/8 was 1.3 but has steadily worsened since -  crt being 3.0 today.  UOP has dropped off and now potassium is elevated as well.  Family discussions have been held by [facility]-  has been made a DNR but other supportive care is desired.  When I come to ICU there are at least a dozen of extended family members there-  called in by the immediate family.  She is edematous, sedated and on pressors. Nursing has been given the order to cap titration of pressor-  essentially no UOP  Creatinine, Ser  Date/Time Value Ref Range Status  08/15/2023 11:30 AM 3.09 (H) 0.44 - 1.00 mg/dL Final  16/02/9603 54:09 AM 2.99 (H) 0.44 - 1.00 mg/dL Final  81/19/1478 29:56 AM 3.02 (H) 0.44 - 1.00 mg/dL Final  21/30/8657 84:69 PM 2.99 (H) 0.44 - 1.00 mg/dL Final  62/95/2841 32:44 AM 1.89 (H) 0.44 - 1.00 mg/dL Final  05/06/7251 66:44 AM 1.90 (H) 0.44 - 1.00 mg/dL Final  03/47/4259 56:38 AM 2.02 (H) 0.44 - 1.00 mg/dL Final  75/64/3329 51:88 AM 1.63 (H) 0.44 - 1.00 mg/dL Final  41/66/0630 16:01 AM 1.30 (H) 0.44 - 1.00 mg/dL Final  09/32/3557 32:20 AM 1.17 (H) 0.44 - 1.00 mg/dL Final  25/42/7062 37:62 AM 1.17 (H) 0.44 - 1.00 mg/dL Final  83/15/1761 60:73 PM 1.22 (H) 0.44 - 1.00 mg/dL Final  71/10/2692 85:46 AM 1.10 (H) 0.44 - 1.00 mg/dL Final  27/07/5007 38:18  PM 1.16 (H) 0.44 - 1.00 mg/dL Final  29/93/7169 67:89 AM 1.26 (H) 0.44 - 1.00 mg/dL Final  38/02/1750 02:58 AM 1.47 (H) 0.44 - 1.00 mg/dL Final  52/77/8242 35:36 AM 1.34 (H) 0.44 - 1.00 mg/dL Final  14/43/1540 08:67 AM 1.17 (H) 0.44 - 1.00 mg/dL Final  61/95/0932 67:12 AM 1.31 (H) 0.44 - 1.00 mg/dL Final  45/80/9983 38:25 AM 1.61 (H) 0.44 - 1.00 mg/dL Final  05/39/7673 41:93 AM 0.87 0.57 - 1.00 mg/dL Final  79/06/4095 35:32 AM 1.10 (H) 0.44 - 1.00 mg/dL Final  99/24/2683 41:96 AM 0.93 0.50 - 1.10 mg/dL Final    Comment:    **Please note change in reference range.**  10/25/2010 05:56 AM 0.68 0.50 - 1.10 mg/dL Final    Comment:    **Please note change in reference range.**  10/24/2010 05:04 AM 0.79 0.50 - 1.10 mg/dL Final    Comment:    **Please note change in reference range.**     PMHx:   Past Medical History:  Diagnosis Date   Hypertension     Past Surgical History:  Procedure Laterality Date   ABDOMINAL HYSTERECTOMY     CHEST TUBE INSERTION N/A 08/10/2023   Procedure: CHEST TUBE INSERTION;  Surgeon: Josiah Nigh, MD;  Location: [facility] ENDOSCOPY;  Service: Pulmonary;  Laterality: N/A;   IR  THORACENTESIS ASP PLEURAL SPACE W/IMG GUIDE  08/06/2023   IR THORACENTESIS ASP PLEURAL SPACE W/IMG GUIDE  08/09/2023    Family Hx: History reviewed. No pertinent family history.  Social History:  reports that she has quit smoking. Her smoking use included cigarettes. She has never used smokeless tobacco. She reports current alcohol use. She reports that she does not use drugs.  Allergies: No Known Allergies  Medications: Prior to Admission medications   Medication Sig Start Date End Date Taking? Authorizing Provider  acetaminophen (TYLENOL) 325 MG tablet Take 2 tablets (650 mg total) by mouth every 6 (six) hours as needed for mild pain (pain score 1-3) (or Fever >/= 101). 07/27/23  Yes Elgergawy, Ardia Kraft, MD  APIXABAN (ELIQUIS) VTE STARTER PACK (10MG  AND 5MG ) Take as directed on package:  start with two-5mg  tablets twice daily for 5 days. On day 6, switch to one-5mg  tablet twice daily on 07/31/2023. 07/27/23  Yes Elgergawy, Dawood S, MD  olmesartan (BENICAR) 40 MG tablet Take 40 mg by mouth daily. 07/14/23  Yes [provider]  pantoprazole (PROTONIX) 40 MG tablet Take 1 tablet (40 mg total) by mouth daily. Patient not taking: Reported on 08/05/2023 07/27/23   Elgergawy, Ardia Kraft, MD    I have reviewed the patient's current medications.  Labs:  Results for orders placed or performed during the hospital encounter of 08/05/23 (from the past 48 hours)  CBC     Status: Abnormal   Collection Time: 08/14/23  9:26 AM  Result Value Ref Range   WBC 14.7 (H) 4.0 - 10.5 K/uL   RBC 2.55 (L) 3.87 - 5.11 MIL/uL   Hemoglobin 8.1 (L) 12.0 - 15.0 g/dL   HCT 56.2 (L) 13.0 - 86.5 %   MCV 98.4 80.0 - 100.0 fL   MCH 31.8 26.0 - 34.0 pg   MCHC 32.3 30.0 - 36.0 g/dL   RDW 78.4 69.6 - 29.5 %   Platelets 504 (H) 150 - 400 K/uL   nRBC 0.0 0.0 - 0.2 %    Comment: Performed at [facility], [address] [place], [place] [address]  Heparin level (unfractionated)     Status: Abnormal   Collection Time: 08/14/23  9:26 AM  Result Value Ref Range   Heparin Unfractionated >1.10 (H) 0.30 - 0.70 IU/mL    Comment: (NOTE) The clinical reportable range upper limit is being lowered to >1.10 to align with the FDA approved guidance for the current laboratory assay.  If heparin results are below expected values, and patient dosage has  been confirmed, suggest follow up testing of antithrombin III levels. Performed at [facility], [address] [place], [place] [address]   Comprehensive metabolic panel with GFR     Status: Abnormal   Collection Time: 08/14/23  9:26 AM  Result Value Ref Range   Sodium 136 135 - 145 mmol/L   Potassium 5.6 (H) 3.5 - 5.1 mmol/L   Chloride 107 98 - 111 mmol/L   CO2 17 (L) 22 - 32 mmol/L   Glucose, Bld 124 (H) 70 - 99 mg/dL    Comment: Glucose  reference range applies only to samples taken after fasting for at least 8 hours.   BUN 45 (H) 8 - 23 mg/dL   Creatinine, Ser 2.44 (H) 0.44 - 1.00 mg/dL   Calcium 8.4 (L) 8.9 - 10.3 mg/dL   Total Protein 4.5 (L) 6.5 - 8.1 g/dL   Albumin <0.1 (L) 3.5 - 5.0 g/dL  AST 18 15 - 41 U/L   ALT 20 0 - 44 U/L   Alkaline Phosphatase 80 38 - 126 U/L   Total Bilirubin 0.5 0.0 - 1.2 mg/dL   GFR, Estimated 29 (L) >60 mL/min    Comment: (NOTE) Calculated using the CKD-EPI Creatinine Equation (2021)    Anion gap 12 5 - 15    Comment: Performed at [facility], [address], [place], [place] [address]  Magnesium     Status: None   Collection Time: 08/14/23  9:26 AM  Result Value Ref Range   Magnesium 2.3 1.7 - 2.4 mg/dL    Comment: Performed at [facility], [address] [place], [place] [address]  C-reactive protein     Status: Abnormal   Collection Time: 08/14/23  9:26 AM  Result Value Ref Range   CRP 7.4 (H) <1.0 mg/dL    Comment: Performed at [facility], [address], [place], [place] [address]  ABO/Rh     Status: None   Collection Time: 08/14/23  9:26 AM  Result Value Ref Range   ABO/RH(D)      O POS Performed at [facility], [address] [place], [place] [address]   Glucose, capillary     Status: Abnormal   Collection Time: 08/14/23  3:14 PM  Result Value Ref Range   Glucose-Capillary 173 (H) 70 - 99 mg/dL    Comment: Glucose reference range applies only to samples taken after fasting for at least 8 hours.  Lactic acid, plasma     Status: Abnormal   Collection Time: 08/14/23  5:36 PM  Result Value Ref Range   Lactic Acid, Venous 5.5 (HH) 0.5 - 1.9 mmol/L    Comment: CRITICAL RESULT CALLED TO, READ BACK BY AND VERIFIED WITH R,DILLY RN @1815  08/14/23 E,BENTON Performed at [facility], [address], [place], [place] [address]   Culture, blood (Routine X 2) w Reflex to ID Panel     Status: None (Preliminary result)   Collection Time:  08/14/23  5:36 PM   Specimen: BLOOD  Result Value Ref Range   Specimen Description BLOOD BLOOD LEFT HAND    Special Requests      BOTTLES DRAWN AEROBIC ONLY Blood Culture results may not be optimal due to an inadequate volume of blood received in culture bottles   Culture      NO GROWTH < 24 HOURS Performed at [facility], [address] [place], [place] [address]    Report Status PENDING   Troponin I (High Sensitivity)     Status: Abnormal   Collection Time: 08/14/23  5:36 PM  Result Value Ref Range   Troponin I (High Sensitivity) 74 (H) <18 ng/L    Comment: (NOTE) Elevated high sensitivity troponin I (hsTnI) values and significant  changes across serial measurements may suggest ACS but many other  chronic and acute conditions are known to elevate hsTnI results.  Refer to the "Links" section for chest pain algorithms and additional  guidance. Performed at [facility], [address], [place], [place] [address]   Culture, blood (Routine X 2) w Reflex to ID Panel     Status: None (Preliminary result)   Collection Time: 08/14/23  5:37 PM   Specimen: BLOOD  Result Value Ref Range   Specimen Description BLOOD BLOOD LEFT ARM    Special Requests      BOTTLES DRAWN AEROBIC AND ANAEROBIC Blood  Culture results may not be optimal due to an inadequate volume of blood received in culture bottles   Culture      NO GROWTH < 24 HOURS Performed at [facility], [address], [place], [place] [address]    Report Status PENDING   Glucose, capillary     Status: Abnormal   Collection Time: 08/14/23  7:33 PM  Result Value Ref Range   Glucose-Capillary 168 (H) 70 - 99 mg/dL    Comment: Glucose reference range applies only to samples taken after fasting for at least 8 hours.  Heparin level (unfractionated)     Status: Abnormal   Collection Time: 08/14/23  9:19 PM  Result Value Ref Range   Heparin Unfractionated 0.72 (H) 0.30 - 0.70 IU/mL    Comment: (NOTE) The clinical  reportable range upper limit is being lowered to >1.10 to align with the FDA approved guidance for the current laboratory assay.  If heparin results are below expected values, and patient dosage has  been confirmed, suggest follow up testing of antithrombin III levels. Performed at [facility], [address] [place], [place] [address]   Lactic acid, plasma     Status: Abnormal   Collection Time: 08/14/23  9:19 PM  Result Value Ref Range   Lactic Acid, Venous >9.0 (HH) 0.5 - 1.9 mmol/L    Comment: CRITICAL RESULT CALLED TO, READ BACK BY AND VERIFIED WITH Berniece Pap RN @2158  08/14/2023 S. BYRD Performed at [facility], [address] [place], [place] [address]   Comprehensive metabolic panel with GFR     Status: Abnormal   Collection Time: 08/14/23 10:32 PM  Result Value Ref Range   Sodium 131 (L) 135 - 145 mmol/L   Potassium 7.3 (HH) 3.5 - 5.1 mmol/L    Comment: CRITICAL RESULT CALLED TO, READ BACK BY AND VERIFIED WITH Amanda Cockayne RN @0051  08/15/2023 S. BYRD   Chloride 105 98 - 111 mmol/L   CO2 <7 (L) 22 - 32 mmol/L   Glucose, Bld 346 (H) 70 - 99 mg/dL    Comment: Glucose reference range applies only to samples taken after fasting for at least 8 hours.   BUN 47 (H) 8 - 23 mg/dL   Creatinine, Ser 8.46 (H) 0.44 - 1.00 mg/dL   Calcium 7.5 (L) 8.9 - 10.3 mg/dL   Total Protein <9.6 (L) 6.5 - 8.1 g/dL   Albumin <2.9 (L) 3.5 - 5.0 g/dL   AST 528 (H) 15 - 41 U/L   ALT 257 (H) 0 - 44 U/L   Alkaline Phosphatase 65 38 - 126 U/L   Total Bilirubin 0.5 0.0 - 1.2 mg/dL   GFR, Estimated 17 (L) >60 mL/min    Comment: (NOTE) Calculated using the CKD-EPI Creatinine Equation (2021)    Anion gap NOT CALCULATED 5 - 15    Comment: Performed at [facility], [address] [place], [place] [address]  CBC     Status: Abnormal   Collection Time: 08/14/23 10:32 PM  Result Value Ref Range   WBC 36.6 (H) 4.0 - 10.5 K/uL   RBC 1.11 (L) 3.87 - 5.11 MIL/uL   Hemoglobin 3.6 (LL) 12.0  - 15.0 g/dL    Comment: REPEATED TO VERIFY THIS CRITICAL RESULT HAS VERIFIED AND BEEN CALLED TO NANCY OLEARY RN BY ROIDER SATRAIN ON 04 12 2025 AT 2259, AND HAS BEEN READ BACK.  RESULTS VERIFIED VIA RECOLLECT OKAY TO RELEASE PER RN CORRECTED ON  04/12 AT 2300: PREVIOUSLY REPORTED AS 3.6 REPEATED TO VERIFY THIS CRITICAL RESULT HAS VERIFIED AND BEEN CALLED TO NANCY OLEARY RN BY ROIDER SATRAIN ON 04 12 2025 AT 2259, AND HAS BEEN READ BACK.     HCT 12.1 (L) 36.0 - 46.0 %   MCV 109.0 (H) 80.0 - 100.0 fL   MCH 32.4 26.0 - 34.0 pg   MCHC 29.8 (L) 30.0 - 36.0 g/dL   RDW 21.3 08.6 - 57.8 %   Platelets 292 150 - 400 K/uL   nRBC 0.2 0.0 - 0.2 %    Comment: Performed at [facility], [address] [place], [place] [address]  Prepare RBC     Status: None   Collection Time: 08/14/23 11:07 PM  Result Value Ref Range   Order Confirmation      ORDER PROCESSED BY BLOOD BANK Performed at [facility], [address] [place], [place] [address]   Type and screen [facility]     Status: None   Collection Time: 08/15/23 12:05 AM  Result Value Ref Range   ABO/RH(D) O POS    Antibody Screen NEG    Sample Expiration 08/17/2023,2359    Unit Number [id number]    Blood Component Type RED CELLS,LR    Unit division 00    Status of Unit ISSUED,FINAL    Unit tag comment EMERGENCY RELEASE    Transfusion Status OK TO TRANSFUSE    Crossmatch Result COMPATIBLE    Unit Number [id number]    Blood Component Type RED CELLS,LR    Unit division 00    Status of Unit ISSUED,FINAL    Unit tag comment EMERGENCY RELEASE    Transfusion Status OK TO TRANSFUSE    Crossmatch Result COMPATIBLE   I-STAT 7, (LYTES, BLD GAS, ICA, H+H)     Status: Abnormal   Collection Time: 08/15/23 12:25 AM  Result Value Ref Range   pH, Arterial 6.883 (LL) 7.35 - 7.45   pCO2 arterial 25.7 (L) 32 - 48 mmHg   pO2, Arterial 539 (H) 83 - 108 mmHg   Bicarbonate 4.9 (L) 20.0 - 28.0 mmol/L   TCO2 6 (L) 22 -  32 mmol/L   O2 Saturation 100 %   Acid-base deficit 26.0 (H) 0.0 - 2.0 mmol/L   Sodium 129 (L) 135 - 145 mmol/L   Potassium 6.3 (HH) 3.5 - 5.1 mmol/L   Calcium, Ion 1.06 (L) 1.15 - 1.40 mmol/L   HCT 19.0 (L) 36.0 - 46.0 %   Hemoglobin 6.5 (LL) 12.0 - 15.0 g/dL   Patient temperature 63.8 F    Collection site RADIAL, ALLEN'S TEST ACCEPTABLE    Drawn by RT    Sample type ARTERIAL    Comment NOTIFIED PHYSICIAN   Glucose, capillary     Status: Abnormal   Collection Time: 08/15/23  1:53 AM  Result Value Ref Range   Glucose-Capillary 253 (H) 70 - 99 mg/dL    Comment: Glucose reference range applies only to samples taken after fasting for at least 8 hours.  Magnesium     Status: Abnormal   Collection Time: 08/15/23  2:14 AM  Result Value Ref Range   Magnesium 2.5 (H) 1.7 - 2.4 mg/dL    Comment: Performed at [facility], [address], [place], [place] [address]  C-reactive protein     Status: Abnormal   Collection Time: 08/15/23  2:14 AM  Result Value Ref Range   CRP 4.2 (H) <1.0  mg/dL    Comment: Performed at [facility], [address] [place], [place] [address]  Basic metabolic panel with GFR     Status: Abnormal   Collection Time: 08/15/23  2:14 AM  Result Value Ref Range   Sodium 132 (L) 135 - 145 mmol/L   Potassium 6.1 (H) 3.5 - 5.1 mmol/L   Chloride 104 98 - 111 mmol/L   CO2 9 (L) 22 - 32 mmol/L   Glucose, Bld 450 (H) 70 - 99 mg/dL    Comment: Glucose reference range applies only to samples taken after fasting for at least 8 hours.   BUN 49 (H) 8 - 23 mg/dL   Creatinine, Ser 3.24 (H) 0.44 - 1.00 mg/dL   Calcium 7.3 (L) 8.9 - 10.3 mg/dL   GFR, Estimated 17 (L) >60 mL/min    Comment: (NOTE) Calculated using the CKD-EPI Creatinine Equation (2021)    Anion gap 19 (H) 5 - 15    Comment: Performed at [facility], [address], [place], [place] [address]  Hemoglobin and hematocrit, blood     Status: Abnormal   Collection Time: 08/15/23  2:14 AM  Result  Value Ref Range   Hemoglobin 8.0 (L) 12.0 - 15.0 g/dL    Comment: REPEATED TO VERIFY POST TRANSFUSION SPECIMEN    HCT 23.6 (L) 36.0 - 46.0 %    Comment: Performed at [facility], [address], [place], [place] [address]  DIC Panel ONCE - STAT     Status: Abnormal   Collection Time: 08/15/23  2:14 AM  Result Value Ref Range   Prothrombin Time 24.4 (H) 11.4 - 15.2 seconds   INR 2.2 (H) 0.8 - 1.2    Comment: (NOTE) INR goal varies based on device and disease states.    aPTT 111 (H) 24 - 36 seconds    Comment:        IF BASELINE aPTT IS ELEVATED, SUGGEST PATIENT RISK ASSESSMENT BE USED TO DETERMINE APPROPRIATE ANTICOAGULANT THERAPY.    Fibrinogen 449 210 - 475 mg/dL    Comment: (NOTE) Fibrinogen results may be underestimated in patients receiving thrombolytic therapy.    D-Dimer, Quant 7.84 (H) 0.00 - 0.50 ug/mL-FEU    Comment: (NOTE) At the manufacturer cut-off value of 0.5 g/mL FEU, this assay has a negative predictive value of 95-100%.This assay is intended for use in conjunction with a clinical pretest probability (PTP) assessment model to exclude pulmonary embolism (PE) and deep venous thrombosis (DVT) in outpatients suspected of PE or DVT. Results should be correlated with clinical presentation.    Platelets 179 150 - 400 K/uL   Smear Review NO SCHISTOCYTES SEEN     Comment: Performed at [facility], [address], [place], [place] [address]  Comprehensive metabolic panel     Status: Abnormal   Collection Time: 08/15/23  2:14 AM  Result Value Ref Range   Sodium 133 (L) 135 - 145 mmol/L   Potassium 6.2 (H) 3.5 - 5.1 mmol/L   Chloride 105 98 - 111 mmol/L   CO2 10 (L) 22 - 32 mmol/L   Glucose, Bld 458 (H) 70 - 99 mg/dL    Comment: Glucose reference range applies only to samples taken after fasting for at least 8 hours.   BUN 48 (H) 8 - 23 mg/dL   Creatinine, Ser 4.74 (H) 0.44 - 1.00 mg/dL   Calcium 7.3 (L) 8.9 - 10.3 mg/dL   Total Protein <2.5 (L)  6.5 - 8.1 g/dL   Albumin <7.8 (L) 3.5 - 5.0 g/dL   AST 2,956 (H) 15 - 41 U/L   ALT 625 (H) 0 - 44 U/L   Alkaline Phosphatase 83 38 - 126 U/L   Total Bilirubin 0.7 0.0 - 1.2 mg/dL   GFR, Estimated 16 (L) >60 mL/min    Comment: (NOTE) Calculated using the CKD-EPI Creatinine Equation (2021)    Anion gap 18 (H) 5 - 15    Comment: Performed at [facility], [address] [place], [place] [address]  I-STAT 7, (LYTES, BLD GAS, ICA, H+H)     Status: Abnormal   Collection Time: 08/15/23  4:03 AM  Result Value Ref Range   pH, Arterial 7.272 (L) 7.35 - 7.45   pCO2 arterial 26.5 (L) 32 - 48 mmHg   pO2, Arterial 154 (H) 83 - 108 mmHg   Bicarbonate 12.2 (L) 20.0 - 28.0 mmol/L   TCO2 13 (L) 22 - 32 mmol/L   O2 Saturation 99 %   Acid-base deficit 13.0 (H) 0.0 - 2.0 mmol/L   Sodium 131 (L) 135 - 145 mmol/L   Potassium 5.7 (H) 3.5 - 5.1 mmol/L   Calcium, Ion 1.08 (L) 1.15 - 1.40 mmol/L   HCT 23.0 (L) 36.0 - 46.0 %   Hemoglobin 7.8 (L) 12.0 - 15.0 g/dL   Patient temperature 65.7 F    Collection site RADIAL, ALLEN'S TEST ACCEPTABLE    Drawn by RT    Sample type ARTERIAL   CBC     Status: Abnormal   Collection Time: 08/15/23  4:46 AM  Result Value Ref Range   WBC 28.7 (H) 4.0 - 10.5 K/uL   RBC 2.71 (L) 3.87 - 5.11 MIL/uL   Hemoglobin 8.3 (L) 12.0 - 15.0 g/dL   HCT 84.6 (L) 96.2 - 95.2 %   MCV 88.9 80.0 - 100.0 fL    Comment: REPEATED TO VERIFY   MCH 30.6 26.0 - 34.0 pg   MCHC 34.4 30.0 - 36.0 g/dL   RDW 84.1 32.4 - 40.1 %   Platelets 185 150 - 400 K/uL   nRBC 0.5 (H) 0.0 - 0.2 %    Comment: Performed at [facility], [address] [place], [place] [address]  Hepatitis panel, acute     Status: None   Collection Time: 08/15/23  4:46 AM  Result Value Ref Range   Hepatitis B Surface Ag NON REACTIVE NON REACTIVE   HCV Ab NON REACTIVE NON REACTIVE    Comment: (NOTE) Nonreactive HCV antibody screen is consistent with no HCV infections,  unless recent infection is suspected or  other evidence exists to indicate HCV infection.     Hep A IgM NON REACTIVE NON REACTIVE   Hep B C IgM NON REACTIVE NON REACTIVE    Comment: Performed at [facility], [address], [place], [place] [address]  Glucose, capillary     Status: Abnormal   Collection Time: 08/15/23  7:45 AM  Result Value Ref Range   Glucose-Capillary 292 (H) 70 - 99 mg/dL    Comment: Glucose reference range applies only to samples taken after fasting for at least 8 hours.  Cooxemetry Panel (carboxy, met, total hgb, O2 sat)     Status: Abnormal   Collection Time: 08/15/23  8:12 AM  Result Value Ref Range   Total hemoglobin 9.7 (L) 12.0 - 16.0 g/dL   O2 Saturation 03.4 %   Carboxyhemoglobin 1.0 0.5 - 1.5 %  Methemoglobin <0.7 0.0 - 1.5 %    Comment: Performed at [facility], [address] [place], [place] [address]  Glucose, capillary     Status: Abnormal   Collection Time: 08/15/23 11:28 AM  Result Value Ref Range   Glucose-Capillary 221 (H) 70 - 99 mg/dL    Comment: Glucose reference range applies only to samples taken after fasting for at least 8 hours.  Lactic acid, plasma     Status: Abnormal   Collection Time: 08/15/23 11:30 AM  Result Value Ref Range   Lactic Acid, Venous 8.0 (HH) 0.5 - 1.9 mmol/L    Comment: CRITICAL VALUE NOTED. VALUE IS CONSISTENT WITH PREVIOUSLY REPORTED/CALLED VALUE Performed at [facility], [address] [place], [place] [address]   CBC     Status: Abnormal   Collection Time: 08/15/23 11:30 AM  Result Value Ref Range   WBC 23.9 (H) 4.0 - 10.5 K/uL   RBC 2.83 (L) 3.87 - 5.11 MIL/uL   Hemoglobin 8.6 (L) 12.0 - 15.0 g/dL   HCT 09.8 (L) 11.9 - 14.7 %   MCV 86.9 80.0 - 100.0 fL   MCH 30.4 26.0 - 34.0 pg   MCHC 35.0 30.0 - 36.0 g/dL   RDW 82.9 (H) 56.2 - 13.0 %   Platelets 221 150 - 400 K/uL   nRBC 0.6 (H) 0.0 - 0.2 %    Comment: Performed at [facility], [address], [place], [place] [address]  Basic metabolic panel     Status:  Abnormal   Collection Time: 08/15/23 11:30 AM  Result Value Ref Range   Sodium 130 (L) 135 - 145 mmol/L   Potassium 5.6 (H) 3.5 - 5.1 mmol/L   Chloride 98 98 - 111 mmol/L   CO2 14 (L) 22 - 32 mmol/L   Glucose, Bld 236 (H) 70 - 99 mg/dL    Comment: Glucose reference range applies only to samples taken after fasting for at least 8 hours.   BUN 48 (H) 8 - 23 mg/dL   Creatinine, Ser 4.69 (H) 0.44 - 1.00 mg/dL   Calcium 7.8 (L) 8.9 - 10.3 mg/dL   GFR, Estimated 16 (L) >60 mL/min    Comment: (NOTE) Calculated using the CKD-EPI Creatinine Equation (2021)    Anion gap 18 (H) 5 - 15    Comment: Performed at [facility], [address] [place], [place] [address]     ROS:  Review of systems not obtained due to patient factors.  Physical Exam: Vitals:   08/15/23 1345 08/15/23 1400  BP:  (!) 106/90  Pulse: (!) 113 (!) 115  Resp: (!) 32 (!) 32  Temp:    SpO2: 100% 100%     General: sedated, intubated-  facial edema  HEENT: PERRLA, EOMI  Neck: positive for JVD Heart: tachy Lungs: CBS bilat Abdomen: obese, distended Extremities:min to moderate dep edema  Skin: warm and dry Neuro:sedated  Assessment/Plan: 68 year old BF with complicated recent past medical history with DVT/PE, now  multisystem organ failure including A on CRF 1.Renal- A on CRF this hosp with baseline crt 1.3 in the setting of MSOF/shock orf unknown etiology.  Is currently hypothermic, hypotensive on 2 pressors, intubated with fio2 50%.  Prognosis is felt to be poor given these factos as well as chest tube dependent pleural effusion-  extensive clot and now what appears to be a bleed from somewhere being too unstable to image.  I feel that adding  dialysis into this mix will not change her overall poor prognosis.  Thee is no absolute need for RRT right now but we are very close.  The family appeared to hear the message from CCM today and she is now DNR.  Will let this information continue to sink in then if we  approach with the dialysis decision they will understand more where we are coming from 2. Hypertension/volume  - overloaded, oliguric but hyootensive.  I dont suspect she will respond to diuretics-  the only way to remove would be with RRT but dont want to take that next step right now-  see how she does through the night 3. Hyperkalemia-  has trended down so far with medical treatment-  cont lokelma and also on bicarb drip at high rate 4. Anemia  - acute drop of hgb while on anticoagulants but cannot determine where bleeding is from -  stopping anticoagulants dangerous with her extensive clot burden    Reche Canales 08/15/2023, 2:56 PM

## 2023-08-15 NOTE — IPAL (Signed)
  Interdisciplinary Goals of Care Family Meeting   Date carried out: 08/15/2023  Location of the meeting: Conference room  Member's involved: Family Member or next of kin and Other: PA-C  [employer] of [employer] or acting medical decision maker: brother Royston Cornea    Discussion: We discussed goals of care for [employer] .  Updated patient's brother Royston Cornea in conference room and sister Leola Raisin over phone. Let them know that patient is continuing to decline and has multiorgan failure. Patient is already DNR. They are waiting on family to arrive to see her and would like to transition to comfort care around 9 am tomorrow so family can be there.   Code status:   Code Status: Limited: Do not attempt resuscitation (DNR) -DNR-LIMITED -Do Not Intubate/DNI    Disposition: continue supportive care and transition to In-patient comfort care in am  Time spent for the meeting: 35 minutes   Casimiro Cleaves, PA-C  08/15/2023, 8:43 PM

## 2023-08-15 NOTE — Plan of Care (Signed)

## 2023-08-15 NOTE — Progress Notes (Signed)
 Pharmacy Antibiotic Note  Betty Zamora is a 68 y.o. female admitted on 08/05/2023 with sepsis.  Pharmacy has been consulted for vancomycin dosing.  Plan: Vancomycin 1500mg  IV x 1 then PRN w altered renal func -levels PRN per protocol -goal trough 15-52mcg/mL, AUC 400-600 Zosyn 3.375 g IV q 8h  Height: 5\' 6"  (167.6 cm) Weight: 70.6 kg (155 lb 10.3 oz) IBW/kg (Calculated) : 59.3  Temp (24hrs), Avg:94.7 F (34.8 C), Min:89.1 F (31.7 C), Max:97.6 F (36.4 C)  Recent Labs  Lab 08/12/23 0629 08/13/23 0751 08/14/23 0926 08/14/23 1736 08/14/23 2119 08/14/23 2232 08/15/23 0214 08/15/23 0446  WBC 9.5 9.7 14.7*  --   --  36.6*  --  28.7*  CREATININE 2.02* 1.90* 1.89*  --   --  2.99* 3.02*  2.99*  --   LATICACIDVEN  --   --   --  5.5* >9.0*  --   --   --     Estimated Creatinine Clearance: 17.1 mL/min (A) (by C-G formula based on SCr of 2.99 mg/dL (H)).    No Known Allergies  Antimicrobials this admission: Zosyn 4/12> Vanc 4/13>  Dose adjustments this admission:   Microbiology results: 4/12 [facility] 4/4 pleural fluid NG x3 days  Thank you for allowing pharmacy to be a part of this patient's care.  Oralee Billow, PharmD, BCCCP Clinical Pharmacist 08/15/2023 9:47 AM

## 2023-08-15 NOTE — Progress Notes (Signed)
 PT Cancellation Note  Patient Details Name: Betty Zamora MRN: [id number] DOB: [birth year]   Cancelled Treatment:    Reason Eval/Treat Not Completed: Medical issues which prohibited therapy remain today, as pt remains hypotensive on pressors and intubated. Will continue to follow and evaluate as appropriate.   Barnabas Booth, PT, DPT   Acute Rehabilitation Department Office [phone] Secure Chat Communication Preferred   Betty Zamora 08/15/2023, 12:56 PM

## 2023-08-15 NOTE — Progress Notes (Signed)
 NAME:  Betty Zamora, MRN:  [id number], DOB:  November 25, 1955, LOS: 10 ADMISSION DATE:  08/05/2023, CONSULTATION DATE:  08/09/23 REFERRING MD:  Dr. Macarthur Savory, CHIEF COMPLAINT:  pleural effusion   History of Present Illness:   75 yoF with PMH of tobacco abuse, HTN, HFpEF, GERD, CKD and recent PE/ RLE DVT with pulmonary infarction requiring hospitalization 3/20> 3/25 discharged home on Eliquis.  Pt states she has had unchanged exertional dyspnea with some orthopnea since her first admit.  Repeat CT chest w/ contrast showed decreased in embolic burden but development of large right pleural effusion with subtotal collapse of right lung. Was admitted to [facility] and started on diureses and transitioned to heparin gtt.  Underwent IR thoracentesis on 4/4 with 1.5L of hazy amber fluid removed with some improvement on expansion noted on CXR, noted elevated LDH and neutrophil predominant, with culture ngtd.  Ongoing dyspnea, with repeat CXR showing increased effusion.  Back to IR 4/7 for repeat thora, drained only and noted to have loculations.  No hypoxia, currently on room air, afebrile, normal WBC.  PCCM called for further evaluation and treatment.    Pt reports reports BLE x 3 months but improved this admit after lasix.  Ongoing easily exertional dyspnea.   Denies any recent fever, chills, cough, sputum production, chest pain, or unintentional weight loss.  Quit smoking on her last admit, 37 year hx, 1 pack per week.  Was working full time in factory standing for 12hrs until recently but does admit to less activity given progressive BLE edema.   Pertinent  Medical History  HTN, recent PE/ RLE DVT on Eliquis, HFpEF, GERD, CKD IIIb, vit D  Significant Hospital Events: Including procedures, antibiotic start and stop dates in addition to other pertinent events   Prior admission 3/20 -2/25 Recent admit for PE w/ pulmonary infarction, RLE DVT d/c on Eliquis  Current admission  4/4 Admit [facility]  large right pleural  effusion, IR R thora 1.5L 4/7 IR right thora 100 ml, noted loculations  4/8 chest tube placed 4/10 total of 500 since CT placed (put out 120 ml over last 24 hrs) CT showed dec'd pleural fluid.  4/11 chest tube malpositioned. Had to be removed.  4/12 initially seen stable on a.m. rounds but by late afternoon patient had acute decompensation with AMS and respiratory distress with shock appearance prompting transfer to ICU and emergent intubation.  Overnight patient continued to decompensate with severely abnormal labs indicating multisystem organ failure 4/13 patient remains critically ill and multisystem organ failure on 3 pressors  Interim History / Subjective:  Seen lying in bed on mechanical ventilation critically ill Received 2U PRBC On levophed 30 and vasopressin  Objective   Blood pressure 92/62, pulse (!) 104, temperature (!) 93.6 F (34.2 C), resp. rate (!) 28, height 5\' 6"  (1.676 m), weight 70.6 kg, SpO2 100%.    Vent Mode: PRVC FiO2 (%):  [50 %-100 %] 50 % Set Rate:  [22 bmp-28 bmp] 28 bmp Vt Set:  [470 mL] 470 mL PEEP:  [5 cmH20-8 cmH20] 8 cmH20 Plateau Pressure:  [21 cmH20-29 cmH20] 29 cmH20   Intake/Output Summary (Last 24 hours) at 08/15/2023 0807 Last data filed at 08/15/2023 0700 Gross per 24 hour  Intake 4484.44 ml  Output 95 ml  Net 4389.44 ml    Filed Weights   08/05/23 1508 08/10/23 1340  Weight: 70.6 kg 70.6 kg   Physical Exam: General: Critically ill-appearing, sedated HENT: [place], AT, ETT in place Eyes: EOMI, no  scleral icterus Respiratory: Diminished air entry on right side.  No crackles, wheezing or rales Cardiovascular: Tachycardic, RR, -M/R/G, no JVD GI: Hypoactive, firm Extremities:-Edema,-tenderness Neuro: Sedated, PERRL GU: Foley in place  ABG 7.27/26/154 K 6.2 CO2 9  Resolved Hospital Problem list    Assessment & Plan:   Undifferentiated shock resulting in multisystem organ failure -Concern for septic and/or cardiogenic and/or  hemorrhagic -COOX with O2 saturations 47.4 a.m. 4/13 P: Remains critically ill in the ICU Vent support as below Continue Zosyn and add vancomycin Currently remains pressor dependent x 3, MAP goal greater than 65 Trend lactic acid Monitor for renal output currently anuric  Stress dose steroids added Once stabilized obtain pan scan  Acute hypoxic respiratory failure Right loculated pleural effusion P: Continue ventilator support with lung protective strategies  Wean PEEP and FiO2 for sats greater than 90%. Head of bed elevated 30 degrees. Plateau pressures less than 30 cm H20.  Follow intermittent chest x-ray and ABG.   SAT/SBT as tolerated, mentation preclude extubation  Ensure adequate pulmonary hygiene  Follow cultures  VAP bundle in place  PAD protocol Antibiotics as above Need chest tube reinserted  Pulmonary embolism with pulmonary infarct Right lower extremity DVT -Anticoagulation on hold given drop in hemoglobin P: Resume anticoagulation when able Pending CT  Anuric renal failure superimposed on chronic kidney disease stage IIIb Hyperkalemia -Creatinine appears to range 1.2-1.3 with GFR upper 30s to mid 40s, creatinine a.m. 4/13 up trended to 3.02 with GFR 16 P: Appreciate Nephrology input. Lokelma 10 mg TID Follow renal function  Monitor urine output Trend Bmet Avoid nephrotoxins Ensure adequate renal perfusion  Continue bicarb drip  Shock liver -The setting of undifferentiated shock P: Continue supportive interventions for shock as above Avoid hepatotoxins Trend LFTs  Acute blood loss anemia Hypocalcemia -Hemoglobin dropped to 3.6 overnight with no external signs of bleeding, 2 units PRBC ordered and heparin drip stopped -Globin stable this a.m. 8.3 P: Trend CBC Transfuse per protocol Hemoglobin goal greater than 7  Chronic HFpEF -Echo 07/23/23 with EF 65 to 70% and grade 1 diastolic dysfunction Essential hypertension P: Repeat limited  echo Strict intake and output Likely will need volume removal per CRRT Optimize electrolytes Continue pressors as above  Best Practice (right click and "Reselect all SmartList Selections" daily)   Diet/type: NPO DVT prophylaxis SCD Pressure ulcer(s): N/A GI prophylaxis: PPI Lines: Central line and Arterial Line Foley:  Yes, and it is still needed Code Status:  full code Last date of multidisciplinary goals of care discussion: Called and updated brother, Royston Cornea regarding patient's critical illness. Unable to make decision without his sisters. I have left voicemails with Leola Raisin and [place].  Critical care time:    The patient is critically ill with multiple organ systems failure and requires high complexity decision making for assessment and support, frequent evaluation and titration of therapies, application of advanced monitoring technologies and extensive interpretation of multiple databases.  Independent Critical Care Time: 65 Minutes.   Genetta Kenning, M.D. [facility] Pulmonary/Critical Care Medicine 08/15/2023 10:09 AM   Please see Amion for pager number to reach on-call Pulmonary and Critical Care Team.

## 2023-08-16 LAB — TYPE AND SCREEN
Unit division: 0
Unit division: 0

## 2023-08-16 LAB — BASIC METABOLIC PANEL WITH GFR
Anion gap: 20 — ABNORMAL HIGH (ref 5–15)
Anion gap: 22 — ABNORMAL HIGH (ref 5–15)
Anion gap: 31 — ABNORMAL HIGH (ref 5–15)
BUN: 47 mg/dL — ABNORMAL HIGH (ref 8–23)
BUN: 46 mg/dL — ABNORMAL HIGH (ref 8–23)
BUN: 46 mg/dL — ABNORMAL HIGH (ref 8–23)
CO2: 14 mmol/L — ABNORMAL LOW (ref 22–32)
CO2: 12 mmol/L — ABNORMAL LOW (ref 22–32)
CO2: 7 mmol/L — ABNORMAL LOW (ref 22–32)
Calcium: 6.6 mg/dL — ABNORMAL LOW (ref 8.9–10.3)
Calcium: 6.3 mg/dL — CL (ref 8.9–10.3)
Calcium: 5.9 mg/dL — CL (ref 8.9–10.3)
Chloride: 98 mmol/L (ref 98–111)
Chloride: 98 mmol/L (ref 98–111)
Chloride: 88 mmol/L — ABNORMAL LOW (ref 98–111)
Creatinine, Ser: 3.3 mg/dL — ABNORMAL HIGH (ref 0.44–1.00)
Creatinine, Ser: 3.47 mg/dL — ABNORMAL HIGH (ref 0.44–1.00)
Creatinine, Ser: 3.99 mg/dL — ABNORMAL HIGH (ref 0.44–1.00)
GFR, Estimated: 15 mL/min — ABNORMAL LOW (ref >60)
GFR, Estimated: 14 mL/min — ABNORMAL LOW (ref >60)
GFR, Estimated: 12 mL/min — ABNORMAL LOW (ref >60)
Glucose, Bld: 137 mg/dL — ABNORMAL HIGH (ref 70–99)
Glucose, Bld: 126 mg/dL — ABNORMAL HIGH (ref 70–99)
Glucose, Bld: 226 mg/dL — ABNORMAL HIGH (ref 70–99)
Potassium: 6.7 mmol/L (ref 3.5–5.1)
Potassium: 7 mmol/L (ref 3.5–5.1)
Potassium: >7.5 mmol/L (ref 3.5–5.1)
Sodium: 132 mmol/L — ABNORMAL LOW (ref 135–145)
Sodium: 132 mmol/L — ABNORMAL LOW (ref 135–145)
Sodium: 126 mmol/L — ABNORMAL LOW (ref 135–145)

## 2023-08-16 LAB — CBC
HCT: 17.1 % — ABNORMAL LOW (ref 36.0–46.0)
Hemoglobin: 5.9 g/dL — CL (ref 12.0–15.0)
MCH: 30.9 pg (ref 26.0–34.0)
MCHC: 34.5 g/dL (ref 30.0–36.0)
MCV: 89.5 fL (ref 80.0–100.0)
Platelets: 157 10*3/uL (ref 150–400)
RBC: 1.91 MIL/uL — ABNORMAL LOW (ref 3.87–5.11)
RDW: 16.5 % — ABNORMAL HIGH (ref 11.5–15.5)
WBC: 30.5 10*3/uL — ABNORMAL HIGH (ref 4.0–10.5)
nRBC: 1.6 % — ABNORMAL HIGH (ref 0.0–0.2)

## 2023-08-16 LAB — GLUCOSE, CAPILLARY: Glucose-Capillary: 157 mg/dL — ABNORMAL HIGH (ref 70–99)

## 2023-08-16 LAB — BPAM RBC
Blood Product Expiration Date: 202505132359
Blood Product Expiration Date: 202505132359
ISSUE DATE / TIME: 202504122309
ISSUE DATE / TIME: 202504122309
Unit Type and Rh: 5100
Unit Type and Rh: 5100

## 2023-08-16 LAB — MAGNESIUM: Magnesium: 2.4 mg/dL (ref 1.7–2.4)

## 2023-08-16 LAB — VANCOMYCIN, RANDOM: Vancomycin Rm: 17 ug/mL

## 2023-08-16 LAB — POTASSIUM: Potassium: 7.3 mmol/L (ref 3.5–5.1)

## 2023-08-16 LAB — ABO/RH

## 2023-08-16 MED ORDER — DEXTROSE 50 % IV SOLN
1.0000 | Freq: Once | INTRAVENOUS | Status: AC
  Administered 2023-08-16: 50 mL via INTRAVENOUS
  Filled 2023-08-16: qty 50

## 2023-08-16 MED ORDER — ALBUTEROL SULFATE (2.5 MG/3ML) 0.083% IN NEBU
10.0000 mg | INHALATION_SOLUTION | Freq: Once | RESPIRATORY_TRACT | Status: AC
  Administered 2023-08-16: 10 mg via RESPIRATORY_TRACT
  Filled 2023-08-16: qty 12

## 2023-08-16 MED ORDER — CALCIUM GLUCONATE 10 % IV SOLN
1.0000 g | Freq: Once | INTRAVENOUS | Status: AC
  Administered 2023-08-16: 1 g via INTRAVENOUS
  Filled 2023-08-16: qty 10

## 2023-08-16 MED ORDER — CALCIUM GLUCONATE 10 % IV SOLN: 1.0000 g | Freq: Once | INTRAVENOUS | Status: DC

## 2023-08-16 MED ORDER — INSULIN ASPART 100 UNIT/ML IV SOLN
5.0000 [IU] | Freq: Once | INTRAVENOUS | Status: AC
  Administered 2023-08-16: 5 [IU] via INTRAVENOUS

## 2023-08-19 ENCOUNTER — Inpatient Hospital Stay

## 2023-08-19 LAB — CULTURE, BLOOD (ROUTINE X 2)
Culture: NO GROWTH
Culture: NO GROWTH

## 2023-08-26 ENCOUNTER — Inpatient Hospital Stay: Admitting: Oncology

## 2023-08-26 ENCOUNTER — Ambulatory Visit: Admitting: Oncology

## 2023-09-02 ENCOUNTER — Inpatient Hospital Stay: Admitting: Pulmonary Disease

## 2023-09-02 NOTE — Progress Notes (Signed)
 Pt extubated per NP.

## 2023-09-02 NOTE — Progress Notes (Signed)
 eLink Physician-Brief Progress Note Patient Name: Betty Zamora DOB: 10-26-1955 MRN: [id number]   Date of Service  08/25/2023  HPI/Events of Note  Hyperkalemia notified   eICU Interventions  Shifting therapy ordered     Intervention Category Major Interventions: Electrolyte abnormality - evaluation and management  [name] G Dhyan Noah 08/11/2023, 12:53 AM

## 2023-09-02 NOTE — Progress Notes (Cosign Needed)
 Interim PCCM Progress Note:   Notified by beside RN, Marius Siemens ICU RN- patient was showing signs of dying-having long pauses per EKG, bradycardia. Plan was to go to comfort this morning at 090 (see Ipal note on 4/13) Upon entering patient's room, patient's rhythm having long pauses and becoming agonal to asystole.  Called patient's brother (next of Kin) Dashay Giesler in regards to patient passing at [phone]. Patient's brother and family in route to hospital.   Time of Death 4- patient's rhythm asystole, no heart sounds present upon auscultation, pupils dilated and fix. Patient on ventilator with no spontaneous breathing efforts.   P:  -Turn off pressors and continuous infusions -Extubate from ventilator  -Provide post-mortem care prior to family arrival  -Call chaplain to beside   Rey Catholic AGACNP-BC   [place] [facility] 08/13/2023, 9:56 AM  Please see [email or web address] for pager details.  From 7A-7P if no response, please call [phone]. After hours, please call ELink [phone].

## 2023-09-02 NOTE — Death Summary Note (Signed)
 DEATH SUMMARY   Patient Details  Name: Betty Zamora MRN: [id number] DOB: 05-Mar-1956  Admission/Discharge Information   Admit Date:  2023/08/19  Date of Death: Date of Death: 08-30-23  Time of Death: Time of Death: 0749  Length of Stay: 08-27-2023  Referring Physician: Assunta Found, MD   Reason(s) for Hospitalization  Acute respiratory failure: Pleural effusion, PE  Diagnoses  Preliminary cause of death: cardiogenic shock Secondary Diagnoses (including complications and co-morbidities):  Principal Problem:   Pleural effusion on right Active Problems:   Essential hypertension, benign   Pulmonary embolism (HCC)   Chronic kidney disease (CKD)   Thrombocytosis   Elevated troponin   Pulmonary infarct (HCC)   Pleural effusion   Shock (HCC)   Goals of care, counseling/discussion DNR Acute respiratory failure with hypoxia RLE DVT  AKI on CKD IIIb  Lactic acidosis  Hyperkalemia  Pulmonary infarct  Elevated LFTs  ABLA Hypocalcemia  Chronic HFpEF   Brief Hospital Course (including significant findings, care, treatment, and services provided and events leading to death)  Betty Zamora is a 68 y.o. year old female who was admitted to the hospital, to hospitalist service on 08/06/23, who required thoracentesis, chest tube placement, developed shock requiring multiple pressors and respiratory failure requiring intubation, who died Aug 30, 2023 at 07:49 as detailed below.   4/4 Admit TRH  large right pleural effusion, IR R thora 1.5L 4/7 IR right thora 100 ml, noted loculations  4/8 chest tube placed 4/10 total of 500 since CT placed (put out 120 ml over last 24 hrs) CT showed dec'd pleural fluid.  08-27-2023 chest tube malpositioned. Had to be removed.  4/12 initially seen stable on a.m. rounds but by late afternoon patient had acute decompensation with AMS and respiratory distress with shock appearance prompting transfer to ICU and emergent intubation.  Overnight patient continued to  decompensate with severely abnormal labs indicating multisystem organ failure 4/13 patient remains critically ill and multisystem organ failure on 3 pressors 08-30-2023 died before able to transition to comfort care   Pertinent Labs and Studies  Significant Diagnostic Studies VAS [place] LOWER EXTREMITY VENOUS (DVT) Result Date: August 30, 2023  Lower Venous DVT Study Patient Name:  Betty Zamora  Date of Exam:   08/15/2023 Medical Rec #: [id number]            Accession #:    [id number] Date of Birth: [birth year]             Patient Gender: F Patient Age:   3 years Exam Location:  [facility] Procedure:      VAS [place] LOWER EXTREMITY VENOUS (DVT) Referring Phys: Alphonzo Lemmings HARRIS --------------------------------------------------------------------------------  Indications: Pulmonary embolism, and History of DVT right popliteal and PTV on 07/22/23.  Risk Factors: Confirmed PE. Performing Technologist: Drysdale Sink Sturdivant-Jones RDMS, RVT  Examination Guidelines: A complete evaluation includes B-mode imaging, spectral Doppler, color Doppler, and power Doppler as needed of all accessible portions of each vessel. Bilateral testing is considered an integral part of a complete examination. Limited examinations for reoccurring indications may be performed as noted. The reflux portion of the exam is performed with the patient in reverse Trendelenburg.  +---------+---------------+---------+-----------+----------+---------------+ RIGHT    CompressibilityPhasicitySpontaneityPropertiesThrombus Aging  +---------+---------------+---------+-----------+----------+---------------+ CFV      Full           Yes      Yes                  Thickened walls +---------+---------------+---------+-----------+----------+---------------+ SFJ      Full                                                         +---------+---------------+---------+-----------+----------+---------------+  FV Prox  Full                                                          +---------+---------------+---------+-----------+----------+---------------+ FV Mid   Full                                                         +---------+---------------+---------+-----------+----------+---------------+ FV DistalFull                                                         +---------+---------------+---------+-----------+----------+---------------+ PFV      Full                                                         +---------+---------------+---------+-----------+----------+---------------+ POP      Partial        Yes      Yes                  Chronic         +---------+---------------+---------+-----------+----------+---------------+ PTV      Partial                                      Chronic         +---------+---------------+---------+-----------+----------+---------------+ PERO     Partial                                      Chronic         +---------+---------------+---------+-----------+----------+---------------+   +---------+---------------+---------+-----------+----------+--------------+ LEFT     CompressibilityPhasicitySpontaneityPropertiesThrombus Aging +---------+---------------+---------+-----------+----------+--------------+ CFV      Full           Yes      Yes                                 +---------+---------------+---------+-----------+----------+--------------+ SFJ      Full                                                        +---------+---------------+---------+-----------+----------+--------------+ FV Prox  Full                                                        +---------+---------------+---------+-----------+----------+--------------+ FV Mid   Full                                                        +---------+---------------+---------+-----------+----------+--------------+  FV DistalFull                                                         +---------+---------------+---------+-----------+----------+--------------+ PFV      Full                                                        +---------+---------------+---------+-----------+----------+--------------+ POP      Full           Yes      Yes                                 +---------+---------------+---------+-----------+----------+--------------+ PTV      Full                                                        +---------+---------------+---------+-----------+----------+--------------+ PERO     Full                                                        +---------+---------------+---------+-----------+----------+--------------+     Summary: RIGHT: - Findings consistent with chronic deep vein thrombosis involving the right posterior tibial veins, right popliteal vein, and right peroneal veins.  - No cystic structure found in the popliteal fossa.  LEFT: - There is no evidence of deep vein thrombosis in the lower extremity.  - No cystic structure found in the popliteal fossa.  *See table(s) above for measurements and observations. Electronically signed by Sherald Hess MD on 08/25/2023 at 12:53:33 PM.    Final    ECHOCARDIOGRAM LIMITED Result Date: 08/15/2023    ECHOCARDIOGRAM LIMITED REPORT   Patient Name:   Betty Zamora Date of Exam: 08/15/2023 Medical Rec #:  [id number]           Height:       66.0 in Accession #:    [id number]          Weight:       155.6 lb Date of Birth:  01-02-1956            BSA:          1.798 m Patient Age:    68 years            BP:           106/90 mmHg Patient Gender: F                   HR:           119 bpm. Exam Location:  Inpatient Procedure: Limited Echo, Color Doppler and Cardiac Doppler (Both Spectral and            Color Flow Doppler were utilized during procedure). Indications:    Shock  History:  Patient has prior history of Echocardiogram examinations, most                 recent 07/23/2023. Risk  Factors:Hypertension.  Sonographer:    Irving Burton Senior RDCS Referring Phys: [id number] Lidia Collum  Sonographer Comments: Technically difficult study, patient supine on artificial respirator. IMPRESSIONS  1. Global hypokinesis with severe underfilling, potentially related to interventricular dependence. Left ventricular ejection fraction, by estimation, is 30 to 35%. The left ventricle has moderately decreased function.  2. Hyperdynamic base with mid wall hypokinesis. Right ventricular systolic function is mildly reduced. The right ventricular size is normal. There is moderately elevated pulmonary artery systolic pressure. The estimated right ventricular systolic pressure is 51.2 mmHg.  3. Moderate pericardial effusion. The pericardial effusion is anterior to the right ventricle.  4. Tricuspid valve regurgitation is severe.  5. Aortic valve regurgitation is trivial.  6. The inferior vena cava is normal in size with <50% respiratory variability, suggesting right atrial pressure of 8 mmHg. Comparison(s): Prior images reviewed side by side. Significant changes have occured. FINDINGS  Left Ventricle: Global hypokinesis with severe underfilling, potentially related to interventricular dependence. Left ventricular ejection fraction, by estimation, is 30 to 35%. The left ventricle has moderately decreased function. The left ventricular internal cavity size was small. Right Ventricle: Hyperdynamic base with mid wall hypokinesis. The right ventricular size is normal. Right ventricular systolic function is mildly reduced. There is moderately elevated pulmonary artery systolic pressure. The tricuspid regurgitant velocity  is 3.01 m/s, and with an assumed right atrial pressure of 15 mmHg, the estimated right ventricular systolic pressure is 51.2 mmHg. Pericardium: A moderately sized pericardial effusion is present. The pericardial effusion is anterior to the right ventricle. Tricuspid Valve: The tricuspid valve is not well  visualized. Tricuspid valve regurgitation is severe. No evidence of tricuspid stenosis. Aortic Valve: Aortic valve regurgitation is trivial. Venous: The inferior vena cava is normal in size with less than 50% respiratory variability, suggesting right atrial pressure of 8 mmHg. Additional Comments: Spectral Doppler performed. Color Doppler performed.  AORTIC VALVE LVOT Vmax:   84.70 cm/s LVOT Vmean:  58.200 cm/s LVOT VTI:    0.116 m TRICUSPID VALVE TR Peak grad:   36.2 mmHg TR Vmax:        301.00 cm/s  SHUNTS Systemic VTI: 0.12 m Riley Lam MD Electronically signed by Riley Lam MD Signature Date/Time: 08/15/2023/3:02:17 PM    Final    US Abdomen Limited RUQ (LIVER/GB) Result Date: 08/15/2023 CLINICAL DATA:  Transaminitis. EXAM: ULTRASOUND ABDOMEN LIMITED RIGHT UPPER QUADRANT COMPARISON:  CT abdomen and pelvis 07/24/2023 FINDINGS: Gallbladder: No gallstones or wall thickening visualized. No sonographic Murphy sign noted by sonographer. Common bile duct: Diameter: 4-5 mm Liver: No focal lesion identified. Within normal limits in parenchymal echogenicity. Subtle nodularity of liver contour evident. Portal vein is patent on color Doppler imaging with normal direction of blood flow towards the liver. Other: Small volume ascites noted adjacent to the liver. Adjacent small cyst noted in the right kidney. IMPRESSION: 1. Subtle nodularity of liver contour raises the question of cirrhosis. 2. Small  volume ascites. 3. No evidence for cholelithiasis or biliary ductal dilatation. Electronically Signed   By: Kennith Center M.D.   On: 08/15/2023 05:34   DG CHEST PORT 1 VIEW Result Date: 08/14/2023 CLINICAL DATA:  Central line placement EXAM: PORTABLE CHEST 1 VIEW COMPARISON:  08/14/2023 FINDINGS: Endotracheal tube remains in place, unchanged. Left internal jugular central line placement with the tip near the cavoatrial junction.  No pneumothorax. Moderate right pleural effusion with right lower lobe  airspace opacity. Left basilar atelectasis or infiltrate. Heart is normal size. Findings stable since prior study. IMPRESSION: Left central line tip at the cavoatrial junction.  No pneumothorax. Otherwise no change. Electronically Signed   By: Charlett Nose M.D.   On: 08/14/2023 19:16   Portable Chest x-ray Result Date: 08/14/2023 CLINICAL DATA:  Endotracheal tube placement. EXAM: PORTABLE CHEST 1 VIEW COMPARISON:  08/22/2023. FINDINGS: The heart size and mediastinal contours are within normal limits. There is atherosclerotic calcification of the aorta. Patchy airspace disease is present in the lungs bilaterally, greater on the right than on the left. There is a moderate-to-large right pleural effusion. No pneumothorax is seen. The endotracheal tube terminates 3.0 cm above the carina. IMPRESSION: 1. Interval increase in bilateral airspace disease and right pleural effusion. 2. Endotracheal tube terminates 3.0 cm above the carina. Electronically Signed   By: Thornell Sartorius M.D.   On: 08/14/2023 19:03   DG Chest Port 1 View Result Date: 08/14/2023 CLINICAL DATA:  16109 Pleuritic chest pain [id number] Pneumothorax [id number] EXAM: PORTABLE CHEST 1 VIEW COMPARISON:  Multiple, most recent August 13, 2023 FINDINGS: Small to moderate volume right pleural effusion, unchanged, with adjacent basilar atelectasis. Trace left pleural effusion. Patchy opacities in the left lung base, likely atelectasis. No cardiomegaly. Aortic atherosclerosis. no acute fracture or destructive lesions. Osteoarthritis of the right glenohumeral joint. Multilevel thoracic osteophytosis. IMPRESSION: Little significant interval change to the lungs and pleural effusions, small to moderate volume on the right and trace on the left. Electronically Signed   By: Wallie Char M.D.   On: 08/14/2023 08:46   DG Chest Port 1 View Result Date: 08/13/2023 CLINICAL DATA:  Pleural effusion. EXAM: PORTABLE CHEST 1 VIEW COMPARISON:  08/13/2023 at 8:56 a.m.  FINDINGS: Stable cardiomediastinal silhouette. No significant change in moderate-sized right pleural effusion with a few bubbles of air in the pleural space at the right lung base. Similar small left effusion with left basilar atelectasis. No other significant change. IMPRESSION: 1. No significant change in moderate-sized right pleural effusion with a few bubbles of air in the pleural space at the right lung base. 2. Similar small left effusion with left basilar atelectasis. Electronically Signed   By: Hart Robinsons M.D.   On: 08/13/2023 13:56   DG CHEST PORT 1 VIEW Result Date: 08/13/2023 CLINICAL DATA:  Pleural effusion EXAM: PORTABLE CHEST 1 VIEW COMPARISON:  X-ray 08/13/2023 and older FINDINGS: Interval removal of the right basilar pigtail chest tube. Persistent right effusion with some bubbles of air in this location at this time. Adjacent opacities. Small left effusion with adjacent opacity. No large pneumothorax. No edema. Stable cardiopericardial silhouette. Overlapping cardiac leads. Degenerative changes along the spine. IMPRESSION: Interval movable of the right basilar pigtail catheter. Few bubbles of air in the pleural space at the right lung base but no larger pneumothorax. Electronically Signed   By: Karen Kays M.D.   On: 08/13/2023 10:58   DG Chest Port 1 View Result Date: 08/13/2023 CLINICAL DATA:  Follow-up chest tube. EXAM: PORTABLE CHEST 1 VIEW COMPARISON:  08/11/2023 FINDINGS: Right basilar pigtail thoracostomy tube is again noted in appears similar position to the previous exam. No pneumothorax identified. Stable cardiomediastinal contours. Lung volumes are low. Unchanged appearance of moderate right and small left pleural effusions. Atelectasis noted within both lung bases. Remote healed left posterior rib fractures are unchanged. IMPRESSION: 1. Stable position of right basilar pigtail thoracostomy tube. No pneumothorax identified.  2. Unchanged appearance of moderate right and small  left pleural effusions. 3. No change in aeration to the lower lungs compared with previous exam. Electronically Signed   By: Signa Kell M.D.   On: 08/13/2023 06:37   CT CHEST WO CONTRAST Result Date: 08/12/2023 CLINICAL DATA:  Pneumonia. EXAM: CT CHEST WITHOUT CONTRAST TECHNIQUE: Multidetector CT imaging of the chest was performed following the standard protocol without IV contrast. RADIATION DOSE REDUCTION: This exam was performed according to the departmental dose-optimization program which includes automated exposure control, adjustment of the mA and/or kV according to patient size and/or use of iterative reconstruction technique. COMPARISON:  08/05/2023 FINDINGS: Cardiovascular: The heart is normal in size. No pericardial effusion. Stable mild fusiform enlargement of the ascending thoracic aorta measuring 3.8 cm. Stable aortic and coronary artery calcifications. Mediastinum/Nodes: Scattered mediastinal and lymph nodes likely reactive given the lung findings. The esophagus is grossly. Lungs/Pleura: A right-sided pleural drainage catheter is in place with significant improvement of the large right pleural effusion seen on the prior study. Small residual pleural effusion and some fluid in the right major fissure. Dense wedge-shaped peripheral airspace consolidation in the right middle lobe likely pulmonary infarction. Similar findings in the left lower lobe. Small left pleural effusion. Streaky areas of subpleural atelectasis and/or pulmonary infiltrates. Upper Abdomen: No significant upper abdominal findings. Musculoskeletal: Interval development marked enlargement the left breast with extensive inflammations/edema possible hematoma. Findings could be due to trauma or mastitis. Recommend clinical correlation. No significant bony findings. IMPRESSION: 1. Right-sided pleural drainage catheter is in place with significant improvement of the large right pleural effusion seen on the prior study. Small residual  pleural effusion and some fluid in the right major fissure. 2. Dense wedge-shaped peripheral airspace consolidation in the right middle lobe likely pulmonary infarction. Similar findings in the left lower lobe. 3. Patchy areas of atelectasis and/or infiltrate. 4. Interval development of marked enlargement of the left breast with extensive inflammations/edema and possible hematoma. Findings could be due to trauma or mastitis. 5. Stable mild fusiform enlargement of the ascending thoracic aorta measuring 3.8 cm. 6. Aortic atherosclerosis. Electronically Signed   By: Rudie Meyer M.D.   On: 08/12/2023 09:21   DG Chest Port 1 View Result Date: 08/11/2023 CLINICAL DATA:  Chest tube in place EXAM: PORTABLE CHEST - 1 VIEW COMPARISON:  12/07/2023 FINDINGS: Cardiomediastinal silhouette and pulmonary vasculature are within normal limits. Right basilar chest tube is unchanged in position. There has been interval decrease of right pleural effusion with small to moderate effusion still remaining. Small left pleural effusion also present. IMPRESSION: Unchanged position of right basilar chest tube with interval decrease of right pleural effusion. Electronically Signed   By: Acquanetta Belling M.D.   On: 08/11/2023 11:21   DG Chest Port 1 View Result Date: 08/10/2023 CLINICAL DATA:  [id number] Chest tube in place [id number] EXAM: PORTABLE CHEST 1 VIEW COMPARISON:  08/09/2023. FINDINGS: Redemonstration of moderate to large right pleural effusion with probable underlying compressive atelectatic changes. No significant interval change. There is new right-sided pleural drainage catheter. No pneumothorax seen. There is subtle blunting of left lateral costophrenic angle, suggesting small left pleural effusion, grossly similar to the prior study. Left lung is otherwise grossly clear. Evaluation of cardiomediastinal silhouette is nondiagnostic due to right lower hemithorax opacification. No acute osseous abnormalities. The soft tissues are within  normal limits. IMPRESSION: *New right-sided pleural drainage catheter. No pneumothorax. Otherwise no significant interval change. Electronically Signed   By: Timoteo Expose.D.  On: 08/10/2023 15:31   DG Chest 1 View Result Date: 08/09/2023 CLINICAL DATA:  Post right thoracentesis EXAM: CHEST  1 VIEW COMPARISON:  08/08/2023 FINDINGS: Large right pleural effusion remains, not significantly changed. Right lower lobe atelectasis or infiltrate. No pneumothorax following thoracentesis. Mild cardiomegaly. No confluent opacity on the left. IMPRESSION: Large right pleural effusion with right lower lobe atelectasis or infiltrate, unchanged. No pneumothorax. Electronically Signed   By: Charlett Nose M.D.   On: 08/09/2023 14:12   IR THORACENTESIS ASP PLEURAL SPACE W/IMG GUIDE Result Date: 08/09/2023 INDICATION: Pt with hx of recent PE, pleural effusion. IR consulted for therapeutic ultrasound guided thoracentesis. EXAM: ULTRASOUND GUIDED THERAPEUTIC RIGHT THORACENTESIS MEDICATIONS: 10 ml 1% lidocaine COMPLICATIONS: None immediate. PROCEDURE: An ultrasound guided thoracentesis was thoroughly discussed with the patient and questions answered. The benefits, risks, alternatives and complications were also discussed. The patient understands and wishes to proceed with the procedure. Written consent was obtained. Ultrasound was performed to localize and mark an adequate pocket of fluid in the right chest. The area was then prepped and draped in the normal sterile fashion. 1% Lidocaine was used for local anesthesia. Under ultrasound guidance a 6 Fr Safe-T-Centesis catheter was introduced. Thoracentesis was performed. The catheter was removed and a dressing applied. FINDINGS: A total of approximately 100 mL of amber fluid was removed. Fluid was highly loculated, seen on [place] images. IMPRESSION: Successful ultrasound guided therapeutic right thoracentesis yielding 100 mL of pleural fluid. Electronically Signed   By: Judie Petit.  Shick M.D.    On: 08/09/2023 13:13   DG Chest 2 View Result Date: 08/08/2023 CLINICAL DATA:  Right pleural effusion. EXAM: CHEST - 2 VIEW COMPARISON:  08/06/2023. FINDINGS: The heart size and mediastinal contours are stable. There is atherosclerotic calcification of the aorta. There is a large right pleural effusion with a associated airspace disease. Lung volumes are low with atelectasis at the left lung base. No pneumothorax. Old healed rib fractures are noted on the left. IMPRESSION: 1. Large right pleural effusion with a associated atelectasis or infiltrate. 2. Left basilar atelectasis. Electronically Signed   By: Thornell Sartorius M.D.   On: 08/08/2023 19:20   IR THORACENTESIS ASP PLEURAL SPACE W/IMG GUIDE Result Date: 08/06/2023 INDICATION: 68 year old female with a history of PE with new large right pleural effusion during current hospitalization. EXAM: ULTRASOUND GUIDED RIGHT THERAPEUTIC AND DIAGNOSTIC THORACENTESIS MEDICATIONS: 9 mL 1% lidocaine COMPLICATIONS: None immediate. PROCEDURE: An ultrasound guided thoracentesis was thoroughly discussed with the patient and questions answered. The benefits, risks, alternatives and complications were also discussed. The patient understands and wishes to proceed with the procedure. Written consent was obtained. Ultrasound was performed to localize and mark an adequate pocket of fluid in the right chest. The area was then prepped and draped in the normal sterile fashion. 1% Lidocaine was used for local anesthesia. Under ultrasound guidance a 6 Fr Safe-T-Centesis catheter was introduced. Thoracentesis was performed. The catheter was removed and a dressing applied. FINDINGS: A total of approximately 1.5 liters of hazy amber fluid was removed. Samples were sent to the laboratory as requested by the clinical team. IMPRESSION: Successful ultrasound guided diagnostic and therapeutic RIGHT thoracentesis yielding 1.5 L of pleural fluid. Performed by Carlton Adam, NP Electronically  Signed   By: Roanna Banning M.D.   On: 08/06/2023 17:26   DG Chest 1 View Result Date: 08/06/2023 CLINICAL DATA:  Status post right thoracentesis, pleural effusion EXAM: CHEST  1 VIEW COMPARISON:  08/05/2023 FINDINGS: Single frontal view of the  chest demonstrates a stable cardiac silhouette. Moderate decrease in right pleural effusion after interval thoracentesis. No evidence of pneumothorax. There is a small to moderate residual right pleural effusion with persistent right basilar consolidation. Left chest is clear. No acute fractures. IMPRESSION: 1. Decreased right pleural effusion after interval thoracentesis, with no evidence of postprocedural pneumothorax. There is small to moderate residual right pleural effusion with persistent right basilar consolidation. Electronically Signed   By: Bobbye Burrow M.D.   On: 08/06/2023 16:53   CT Angio Chest PE W/Cm &/Or Wo Cm Result Date: 08/05/2023 CLINICAL DATA:  Pulmonary embolism, progressive dyspnea EXAM: CT ANGIOGRAPHY CHEST WITH CONTRAST TECHNIQUE: Multidetector CT imaging of the chest was performed using the standard protocol during bolus administration of intravenous contrast. Multiplanar CT image reconstructions and MIPs were obtained to evaluate the vascular anatomy. RADIATION DOSE REDUCTION: This exam was performed according to the departmental dose-optimization program which includes automated exposure control, adjustment of the mA and/or kV according to patient size and/or use of iterative reconstruction technique. CONTRAST:  75mL OMNIPAQUE IOHEXOL 350 MG/ML SOLN COMPARISON:  07/22/2023 FINDINGS: Cardiovascular: There is again seen multiple branching intraluminal filling defects now which demonstrate a more eccentric position within the vessel lumen as well as a more irregular contour in keeping with subacute pulmonary embolism. The embolic burden is moderate, the embolic burden has decreased since prior examination. No interval acute pulmonary embolism  identified. Central pulmonary arteries are enlarged in keeping with changes of pulmonary arterial hypertension. Previously noted right heart strain has resolved (RV/LV = 0.8). Global cardiac size is within normal limits. No pericardial effusion. The thoracic aorta is of normal caliber. Mild atherosclerotic calcification. Mediastinum/Nodes: No enlarged mediastinal, hilar, or axillary lymph nodes. Thyroid gland, trachea, and esophagus demonstrate no significant findings. Lungs/Pleura: Large right pleural effusion has developed with subtotal collapse of the right lung. There is non enhancement involving the lateral segment of the right middle lobe in keeping with a pulmonary infarct within this region. Left lung is clear. No pleural effusion on the left. No pneumothorax. Upper Abdomen: No acute abnormality. Musculoskeletal: Remote healed left rib fractures. No acute bone abnormality Review of the MIP images confirms the above findings. IMPRESSION: 1. Subacute pulmonary embolism. Moderate embolic burden, decreased since prior examination. No interval acute pulmonary embolism identified. Resolution of previously noted right heart strain. 2. Interval development of a large right pleural effusion with subtotal collapse of the right lung. 3. Interval development of a pulmonary infarct involving the lateral segment of the right middle lobe. 4. Morphologic changes in keeping with pulmonary arterial hypertension. Aortic Atherosclerosis (ICD10-I70.0). Electronically Signed   By: Worthy Heads M.D.   On: 08/05/2023 20:06   DG Chest 2 View Result Date: 08/05/2023 CLINICAL DATA:  Short of breath, right lower extremity DVT and PE EXAM: CHEST - 2 VIEW COMPARISON:  07/24/2023 FINDINGS: Frontal and lateral views of the chest demonstrate a stable cardiac silhouette. There is progressive opacification of the lower 2/3 of the right hemithorax, compatible with enlarging right pleural effusion and underlying consolidation. This could  reflect atelectasis or infarct given known pulmonary embolus. Left chest is clear. No pneumothorax. No acute bony abnormalities. IMPRESSION: 1. Increased opacification right hemithorax consistent with enlarging pleural effusion and progressive right lung consolidation. This could reflect underlying atelectasis or infarct, given patient's known history of pulmonary embolus. Electronically Signed   By: Bobbye Burrow M.D.   On: 08/05/2023 17:05   DG Chest Port 1 View Result Date: 07/24/2023 CLINICAL DATA:  Worsening  shortness of breath.  Pulmonary embolism. EXAM: PORTABLE CHEST 1 VIEW COMPARISON:  07/22/2023 FINDINGS: The heart size and mediastinal contours are within normal limits. Increased right lower lobe infiltrate and small right pleural effusion since prior exam. Left lung remains clear. Several old left rib fracture deformities again seen. IMPRESSION: Increased right lower lobe infiltrate and small right pleural effusion. Electronically Signed   By: Marlyce Sine M.D.   On: 07/24/2023 10:46   CT ABDOMEN PELVIS WO CONTRAST Result Date: 07/24/2023 CLINICAL DATA:  Unprovoked DVT and pulmonary embolus. To assess for malignancy. EXAM: CT ABDOMEN AND PELVIS WITHOUT CONTRAST TECHNIQUE: Multidetector CT imaging of the abdomen and pelvis was performed following the standard protocol without IV contrast. RADIATION DOSE REDUCTION: This exam was performed according to the departmental dose-optimization program which includes automated exposure control, adjustment of the mA and/or kV according to patient size and/or use of iterative reconstruction technique. COMPARISON:  CT abdomen and pelvis 05/03/2019.  CT chest 07/22/2023 FINDINGS: Lower chest: Bilateral pleural effusions and atelectasis or consolidation. See prior report of CT chest.Old rib fractures. Hepatobiliary: No focal liver abnormality is seen. No gallstones, gallbladder wall thickening, or biliary dilatation. Pancreas: Unremarkable. No pancreatic ductal  dilatation or surrounding inflammatory changes. Spleen: Normal in size without focal abnormality. Adrenals/Urinary Tract: Adrenal glands are unremarkable. Kidneys are normal, without renal calculi, focal lesion, or hydronephrosis. Bladder is unremarkable. Stomach/Bowel: Stomach is within normal limits. Appendix appears normal. No evidence of bowel wall thickening, distention, or inflammatory changes. Vascular/Lymphatic: Aortic atherosclerosis. No enlarged abdominal or pelvic lymph nodes. Reproductive: Status post hysterectomy. No adnexal masses. Other: No abdominal wall hernia or abnormality. No abdominopelvic ascites. Diffuse edema in the subcutaneous fat. Musculoskeletal: Degenerative changes in the spine. No acute or significant osseous findings. IMPRESSION: 1. Bilateral pleural effusions and basilar consolidation, greater on the right. 2. No primary carcinoma seen. Note that contrast enhanced CT would be more sensitive for evaluation of the solid organs. Electronically Signed   By: Boyce Byes M.D.   On: 07/24/2023 02:52   ECHOCARDIOGRAM COMPLETE Result Date: 07/23/2023    ECHOCARDIOGRAM REPORT   Patient Name:   Betty Zamora Date of Exam: 07/23/2023 Medical Rec #:  [id number]           Height:       64.0 in Accession #:    [id number]          Weight:       154.0 lb Date of Birth:  [birth year]            BSA:          1.751 m Patient Age:    68 years            BP:           119/77 mmHg Patient Gender: F                   HR:           94 bpm. Exam Location:  Inpatient Procedure: 2D Echo, Cardiac Doppler and Color Doppler (Both Spectral and Color            Flow Doppler were utilized during procedure). Indications:    Stroke I63.9  History:        Patient has no prior history of Echocardiogram examinations.                 Risk Factors:Hypertension.  Sonographer:    Terrilee Few RCS Referring Phys: [phone] CARLOS MADERA  IMPRESSIONS  1. Left ventricular ejection fraction, by estimation, is 65 to 70%.  Left ventricular ejection fraction by PLAX is 67 %. The left ventricle has normal function. The left ventricle has no regional wall motion abnormalities. There is moderate asymmetric left ventricular hypertrophy of the basal-septal segment. Left ventricular diastolic parameters are consistent with Grade I diastolic dysfunction (impaired relaxation).  2. Right ventricular systolic function is normal. The right ventricular size is normal. There is normal pulmonary artery systolic pressure. The estimated right ventricular systolic pressure is 25.8 mmHg.  3. Left atrial size was mildly dilated.  4. The mitral valve is grossly normal. Trivial mitral valve regurgitation.  5. The aortic valve is tricuspid. Aortic valve regurgitation is trivial.  6. Aortic dilatation noted. There is borderline dilatation of the aortic root, measuring 38 mm. There is mild dilatation of the ascending aorta, measuring 41 mm.  7. The inferior vena cava is normal in size with greater than 50% respiratory variability, suggesting right atrial pressure of 3 mmHg. Comparison(s): No prior Echocardiogram. FINDINGS  Left Ventricle: Left ventricular ejection fraction, by estimation, is 65 to 70%. Left ventricular ejection fraction by PLAX is 67 %. The left ventricle has normal function. The left ventricle has no regional wall motion abnormalities. The left ventricular internal cavity size was normal in size. There is moderate asymmetric left ventricular hypertrophy of the basal-septal segment. Left ventricular diastolic parameters are consistent with Grade I diastolic dysfunction (impaired relaxation). Indeterminate filling pressures. Right Ventricle: The right ventricular size is normal. No increase in right ventricular wall thickness. Right ventricular systolic function is normal. There is normal pulmonary artery systolic pressure. The tricuspid regurgitant velocity is 2.39 m/s, and  with an assumed right atrial pressure of 3 mmHg, the estimated right  ventricular systolic pressure is 25.8 mmHg. Left Atrium: Left atrial size was mildly dilated. Right Atrium: Right atrial size was normal in size. Pericardium: There is no evidence of pericardial effusion. Mitral Valve: The mitral valve is grossly normal. Trivial mitral valve regurgitation. Tricuspid Valve: The tricuspid valve is grossly normal. Tricuspid valve regurgitation is trivial. Aortic Valve: The aortic valve is tricuspid. Aortic valve regurgitation is trivial. Aortic regurgitation PHT measures 329 msec. Aortic valve peak gradient measures 5.9 mmHg. Pulmonic Valve: The pulmonic valve was grossly normal. Pulmonic valve regurgitation is trivial. Aorta: Aortic dilatation noted. There is borderline dilatation of the aortic root, measuring 38 mm. There is mild dilatation of the ascending aorta, measuring 41 mm. Venous: The inferior vena cava is normal in size with greater than 50% respiratory variability, suggesting right atrial pressure of 3 mmHg. IAS/Shunts: The interatrial septum is aneurysmal. No atrial level shunt detected by color flow Doppler.  LEFT VENTRICLE PLAX 2D LV EF:         Left            Diastology                ventricular     LV e' medial:    6.09 cm/s                ejection        LV E/e' medial:  10.2                fraction by     LV e' lateral:   8.16 cm/s                PLAX is 67      LV E/e' lateral: 7.6                %.  LVIDd:         4.10 cm LVIDs:         2.60 cm LV PW:         1.10 cm LV IVS:        1.00 cm LVOT diam:     2.20 cm LV SV:         47 LV SV Index:   27 LVOT Area:     3.80 cm  RIGHT VENTRICLE             IVC RV S prime:     12.70 cm/s  IVC diam: 1.70 cm TAPSE (M-mode): 1.3 cm LEFT ATRIUM             Index        RIGHT ATRIUM           Index LA diam:        3.70 cm 2.11 cm/m   RA Area:     11.50 cm LA Vol (A2C):   44.0 ml 25.13 ml/m  RA Volume:   23.80 ml  13.59 ml/m LA Vol (A4C):   38.3 ml 21.88 ml/m LA Biplane Vol: 39.1 ml 22.33 ml/m  AORTIC VALVE AV Area  (Vmax): 2.67 cm AV Vmax:        121.00 cm/s AV Peak Grad:   5.9 mmHg LVOT Vmax:      85.00 cm/s LVOT Vmean:     52.500 cm/s LVOT VTI:       0.124 m AI PHT:         329 msec  AORTA Ao Root diam: 3.80 cm Ao Asc diam:  4.10 cm MITRAL VALVE               TRICUSPID VALVE MV Area (PHT): 4.41 cm    TR Peak grad:   22.8 mmHg MV Decel Time: 172 msec    TR Vmax:        239.00 cm/s MV E velocity: 62.30 cm/s MV A velocity: 75.30 cm/s  SHUNTS MV E/A ratio:  0.83        Systemic VTI:  0.12 m                            Systemic Diam: 2.20 cm Zoila Shutter MD Electronically signed by Zoila Shutter MD Signature Date/Time: 07/23/2023/2:28:23 PM    Final    US Venous Img Lower Bilateral (DVT) Result Date: 07/22/2023 CLINICAL DATA:  141700 Pulmonary embolism (HCC) 141700. Bilateral lower extremity swelling. Shortness of breath. EXAM: BILATERAL LOWER EXTREMITY VENOUS DOPPLER ULTRASOUND TECHNIQUE: Gray-scale sonography with compression, as well as color and duplex ultrasound, were performed to evaluate the deep venous system(s) from the level of the common femoral vein through the popliteal and proximal calf veins. COMPARISON:  None Available. FINDINGS: VENOUS Right lower extremity: Normal compressibility of the common femoral, profunda femoral and superficial femoral veins. There is thrombosis of the popliteal vein and 1 of the 2 posterior tibial vein. There is also thrombosis of the great saphenous vein at the saphenofemoral junction region. Left lower extremity: Normal compressibility of the common femoral, superficial femoral, and popliteal veins, as well as the visualized calf veins. Visualized portions of profunda femoral vein and great saphenous vein unremarkable. No filling defects to suggest DVT on grayscale or color Doppler imaging. Doppler waveforms show normal direction of venous flow, normal respiratory plasticity and response to augmentation. OTHER Bilateral calf subcutaneous edema noted. Limitations: none IMPRESSION:  1. Right lower extremity DVT involving the popliteal and posterior tibial veins. There is also thrombosis of the right great saphenous vein at the saphenofemoral junction region. 2. No left lower extremity DVT. 3. Bilateral calf subcutaneous edema. Electronically Signed   By: Beula Brunswick M.D.   On: 07/22/2023 14:50   CT Angio Chest PE W/Cm &/Or Wo Cm Addendum Date: 07/22/2023 ADDENDUM REPORT: 07/22/2023 11:28 ADDENDUM: Critical Value/emergent results were called by telephone at the time of interpretation on 07/22/2023 at 11:27 am to provider [facility] , who verbally acknowledged these results. Electronically Signed   By: Beula Brunswick M.D.   On: 07/22/2023 11:28   Result Date: 07/22/2023 CLINICAL DATA:  Pulmonary embolism (PE) suspected, low to intermediate prob, positive D-dimer. Positional chest pain for 2 days. EXAM: CT ANGIOGRAPHY CHEST WITH CONTRAST TECHNIQUE: Multidetector CT imaging of the chest was performed using the standard protocol during bolus administration of intravenous contrast. Multiplanar CT image reconstructions and MIPs were obtained to evaluate the vascular anatomy. RADIATION DOSE REDUCTION: This exam was performed according to the departmental dose-optimization program which includes automated exposure control, adjustment of the mA and/or kV according to patient size and/or use of iterative reconstruction technique. CONTRAST:  60mL OMNIPAQUE IOHEXOL 350 MG/ML SOLN COMPARISON:  CT angiography chest from 10/25/2004. FINDINGS: Cardiovascular: There is moderate to large volume acute pulmonary embolism. On the left, there is nonocclusive thrombus in the left main pulmonary artery with extension into the lobar, segmental and proximal subsegmental pulmonary artery branches which is nonocclusive. On the right, there is also nonocclusive thrombus in the right main pulmonary artery with extension into the right upper lobe segmental pulmonary artery cough which is nonocclusive. However,  there is occlusive thrombus in the middle lobe lobar pulmonary artery and there is nonocclusive thrombus in the right lung lower lobe lobar pulmonary artery with extension into the segmental and proximal subsegmental pulmonary arteries. There is dilation of the main pulmonary trunk measuring up to 3.4 cm, which is nonspecific but can be seen with pulmonary artery hypertension. There is flattening of the interventricular septum, concerning for right heart strain. There is linear opacity with associated volume loss in the middle lobe, which does not exhibit enhancement of the parenchyma and is concerning for lung infarction. Normal cardiac size. No pericardial effusion. No aortic aneurysm. Mediastinum/Nodes: Visualized thyroid gland appears grossly unremarkable. No solid / cystic mediastinal masses. The esophagus is nondistended precluding optimal assessment. No axillary, mediastinal or hilar lymphadenopathy by size criteria. Lungs/Pleura: The central tracheo-bronchial tree is patent. There is small right pleural effusion. There are patchy atelectatic changes in the right upper lobe and right lower lobe. There are focal ground-glass opacities in the right lung lower lobe inferiorly, which are nonspecific and may represent sequela of infection or inflammation. No suspicious lung mass. No left pleural effusion. No pneumothorax on either side. No suspicious lung nodule. The Upper Abdomen: There is a subcapsular 5 mm sized calcification in the right hepatic lobe, favored to represent calcified granuloma. There is a 2.1 x 2.1 cm simple cyst in the right kidney. Remaining visualized upper abdominal viscera within normal limits. Musculoskeletal: The visualized soft tissues of the chest wall are grossly unremarkable. No suspicious osseous lesions. There are mild to moderate multilevel degenerative changes in the visualized spine. Multiple old healed right-sided rib fractures noted. Review of the MIP images confirms the above  findings. IMPRESSION: 1. There is moderate to large volume acute pulmonary embolism with CT evidence of right heart strain. There is  nonocclusive thrombus in the left main, right upper lobe and right lower lobe lobar pulmonary arteries. There is occlusive thrombus in the middle lobe lobar pulmonary artery. 2. There is a linear opacity with associated volume loss in the middle lobe, which does not exhibit enhancement of the parenchyma and is concerning for lung infarction. 3. Multiple other nonacute observations, as described above. 4. There are focal ground-glass opacities in the right lung lower lobe inferiorly, which are nonspecific and may represent sequela of infection or inflammation. Electronically Signed: By: Jules Schick M.D. On: 07/22/2023 11:20   DG Chest Port 1 View Result Date: 07/22/2023 CLINICAL DATA:  Chest pain. EXAM: PORTABLE CHEST 1 VIEW COMPARISON:  10/19/2008. FINDINGS: Focal areas of atelectasis/scarring noted overlying the right lung base. There is associated mild to moderate elevation of right hemidiaphragm. Bilateral lung fields are otherwise clear. No dense consolidation. Bilateral costophrenic angles are clear. No pneumothorax. Normal cardio-mediastinal silhouette. No acute osseous abnormalities. The soft tissues are within normal limits. IMPRESSION: *No active disease. Electronically Signed   By: Jules Schick M.D.   On: 07/22/2023 09:46   US ARTERIAL ABI (SCREENING LOWER EXTREMITY) Result Date: 07/21/2023 CLINICAL DATA:  Venous insufficiency, bilateral lower extremity edema EXAM: NONINVASIVE PHYSIOLOGIC VASCULAR STUDY OF BILATERAL LOWER EXTREMITIES TECHNIQUE: Evaluation of both lower extremities were performed at rest, including calculation of ankle-brachial indices with single level Doppler, pressure and pulse volume recording. COMPARISON:  None Available. FINDINGS: Right ABI:  1.1 Left ABI:  1.1 Right Lower Extremity:  Normal arterial waveforms at the ankle. Left Lower  Extremity:  Normal arterial waveforms at the ankle. 1.0-1.4 Normal IMPRESSION: Normal bilateral resting ankle-brachial indices. Electronically Signed   By: Malachy Moan M.D.   On: 07/21/2023 07:23    Microbiology Recent Results (from the past 240 hours)  Body fluid culture w Gram Stain     Status: None   Collection Time: 08/06/23  4:35 PM   Specimen: Lung, Right; Pleural Fluid  Result Value Ref Range Status   Specimen Description FLUID RIGHT  Final   Special Requests NONE  Final   Gram Stain   Final    FEW WBC PRESENT,BOTH PMN AND MONONUCLEAR NO ORGANISMS SEEN    Culture   Final    NO GROWTH 3 DAYS Performed at [facility], [address] [place], [place] [address]    Report Status 08/10/2023 FINAL  Final  Culture, blood (Routine X 2) w Reflex to ID Panel     Status: None (Preliminary result)   Collection Time: 08/14/23  5:36 PM   Specimen: BLOOD  Result Value Ref Range Status   Specimen Description BLOOD BLOOD LEFT HAND  Final   Special Requests   Final    BOTTLES DRAWN AEROBIC ONLY Blood Culture results may not be optimal due to an inadequate volume of blood received in culture bottles   Culture   Final    NO GROWTH 2 DAYS Performed at [facility], [address], [place], [place] [address]    Report Status PENDING  Incomplete  Culture, blood (Routine X 2) w Reflex to ID Panel     Status: None (Preliminary result)   Collection Time: 08/14/23  5:37 PM   Specimen: BLOOD  Result Value Ref Range Status   Specimen Description BLOOD BLOOD LEFT ARM  Final   Special Requests   Final    BOTTLES DRAWN AEROBIC AND ANAEROBIC Blood Culture results may not be optimal due to an inadequate volume  of blood received in culture bottles   Culture   Final    NO GROWTH 2 DAYS Performed at [facility], [address] [place], [place] [address]    Report Status PENDING  Incomplete    Lab Basic Metabolic Panel: Recent Labs  Lab 08/12/23 0629 08/13/23 0751  08/14/23 0926 08/14/23 2232 08/15/23 0214 08/15/23 0403 08/15/23 1130 08/15/23 1615 08/15/23 1852 08/15/23 2339 09/01/2023 0500  NA 136 137 136   < > 133*  132* 131* 130* 132* 132*  --  126*  K 4.9 5.0 5.6*   < > 6.2*  6.1* 5.7* 5.6* 6.7* 7.0* 7.3* >7.5*  CL 108 109 107   < > 105  104  --  98 98 98  --  88*  CO2 18* 18* 17*   < > 10*  9*  --  14* 14* 12*  --  7*  GLUCOSE 95 97 124*   < > 458*  450*  --  236* 137* 126*  --  226*  BUN 42* 42* 45*   < > 48*  49*  --  48* 47* 46*  --  46*  CREATININE 2.02* 1.90* 1.89*   < > 3.02*  2.99*  --  3.09* 3.30* 3.47*  --  3.99*  CALCIUM 8.3* 8.2* 8.4*   < > 7.3*  7.3*  --  7.8* 6.6* 6.3*  --  5.9*  MG 2.2 2.3 2.3  --  2.5*  --   --   --   --   --  2.4   < > = values in this interval not displayed.   Liver Function Tests: Recent Labs  Lab 08/12/23 0629 08/13/23 0751 08/14/23 0926 08/14/23 2232 08/15/23 0214  AST 11* 17 18 472* 1,193*  ALT 14 17 20  257* 625*  ALKPHOS 85 89 80 65 83  BILITOT 0.2 0.4 0.5 0.5 0.7  PROT 4.4* 4.6* 4.5* <3.0* <3.0*  ALBUMIN <1.5* <1.5* <1.5* <1.5* <1.5*   No results for input(s): "LIPASE", "AMYLASE" in the last 168 hours. No results for input(s): "AMMONIA" in the last 168 hours. CBC: Recent Labs  Lab 08/14/23 0926 08/14/23 2232 08/15/23 0025 08/15/23 0214 08/15/23 0403 08/15/23 0446 08/15/23 1130 08/15/23 1615 08/15/2023 0500  WBC 14.7* 36.6*  --   --   --  28.7* 23.9*  --  30.5*  HGB 8.1* 3.6*   < > 8.0* 7.8* 8.3* 8.6* 8.5* 5.9*  HCT 25.1* 12.1*   < > 23.6* 23.0* 24.1* 24.6* 23.7* 17.1*  MCV 98.4 109.0*  --   --   --  88.9 86.9  --  89.5  PLT 504* 292  --  179  --  185 221  --  157   < > = values in this interval not displayed.   Cardiac Enzymes: No results for input(s): "CKTOTAL", "CKMB", "CKMBINDEX", "TROPONINI" in the last 168 hours. Sepsis Labs: Recent Labs  Lab 08/14/23 1736 08/14/23 2119 08/14/23 2232 08/15/23 0446 08/15/23 1130 08/15/23 1615 08/13/2023 0500  WBC  --   --   36.6* 28.7* 23.9*  --  30.5*  LATICACIDVEN 5.5* >9.0*  --   --  8.0* 8.7*  --     Procedures/Operations  4/4 IR thora 4/7 IR thora  4/8 PCCM chest tube  4/9 tpa/dornase  4/12 intubation 4/12 CVC 4/12 arterial line   Lanier Clam 08/13/2023, 2:40 PM

## 2023-09-02 DEATH — deceased
